# Patient Record
Sex: Male | Born: 1965
Health system: Southern US, Community
[De-identification: ages and names within clinical notes are randomized; demographics above are authoritative.]

## PROBLEM LIST (undated history)

## (undated) DIAGNOSIS — I480 Paroxysmal atrial fibrillation: Secondary | ICD-10-CM

## (undated) DIAGNOSIS — K219 Gastro-esophageal reflux disease without esophagitis: Secondary | ICD-10-CM

## (undated) DIAGNOSIS — K7581 Nonalcoholic steatohepatitis (NASH): Secondary | ICD-10-CM

## (undated) DIAGNOSIS — R739 Hyperglycemia, unspecified: Secondary | ICD-10-CM

## (undated) DIAGNOSIS — M1990 Unspecified osteoarthritis, unspecified site: Secondary | ICD-10-CM

## (undated) DIAGNOSIS — I77810 Thoracic aortic ectasia: Secondary | ICD-10-CM

## (undated) DIAGNOSIS — I5189 Other ill-defined heart diseases: Secondary | ICD-10-CM

## (undated) DIAGNOSIS — Z87442 Personal history of urinary calculi: Secondary | ICD-10-CM

## (undated) DIAGNOSIS — K810 Acute cholecystitis: Secondary | ICD-10-CM

## (undated) DIAGNOSIS — M069 Rheumatoid arthritis, unspecified: Secondary | ICD-10-CM

## (undated) DIAGNOSIS — F32A Depression, unspecified: Secondary | ICD-10-CM

## (undated) DIAGNOSIS — G4730 Sleep apnea, unspecified: Secondary | ICD-10-CM

## (undated) DIAGNOSIS — D374 Neoplasm of uncertain behavior of colon: Secondary | ICD-10-CM

## (undated) DIAGNOSIS — I10 Essential (primary) hypertension: Secondary | ICD-10-CM

## (undated) DIAGNOSIS — R748 Abnormal levels of other serum enzymes: Secondary | ICD-10-CM

## (undated) DIAGNOSIS — D126 Benign neoplasm of colon, unspecified: Secondary | ICD-10-CM

## (undated) DIAGNOSIS — Z9289 Personal history of other medical treatment: Secondary | ICD-10-CM

## (undated) DIAGNOSIS — E119 Type 2 diabetes mellitus without complications: Secondary | ICD-10-CM

## (undated) DIAGNOSIS — M059 Rheumatoid arthritis with rheumatoid factor, unspecified: Secondary | ICD-10-CM

## (undated) DIAGNOSIS — G4733 Obstructive sleep apnea (adult) (pediatric): Secondary | ICD-10-CM

## (undated) HISTORY — PX: OTHER SURGICAL HISTORY: SHX169

## (undated) HISTORY — PX: ARTHROTOMY: SHX134

## (undated) HISTORY — PX: CHOLECYSTECTOMY: SHX55

## (undated) HISTORY — PX: ROTATOR CUFF REPAIR: SHX139

## (undated) HISTORY — PX: INGUINAL HERNIA REPAIR: SUR1180

## (undated) HISTORY — DX: Unspecified osteoarthritis, unspecified site: M19.90

---
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
## 2004-12-13 ENCOUNTER — Emergency Department: Payer: Self-pay | Admitting: Emergency Medicine

## 2004-12-13 ENCOUNTER — Other Ambulatory Visit: Payer: Self-pay

## 2005-06-21 ENCOUNTER — Emergency Department: Payer: Self-pay | Admitting: Emergency Medicine

## 2005-06-21 ENCOUNTER — Other Ambulatory Visit: Payer: Self-pay

## 2005-06-26 ENCOUNTER — Ambulatory Visit: Payer: Self-pay | Admitting: General Practice

## 2005-07-02 ENCOUNTER — Ambulatory Visit: Payer: Self-pay | Admitting: Neurology

## 2008-09-18 ENCOUNTER — Emergency Department: Payer: Self-pay | Admitting: Emergency Medicine

## 2009-08-25 ENCOUNTER — Emergency Department: Payer: Self-pay | Admitting: Emergency Medicine

## 2009-08-29 ENCOUNTER — Ambulatory Visit: Payer: Self-pay | Admitting: General Practice

## 2009-09-15 ENCOUNTER — Emergency Department: Payer: Self-pay | Admitting: Emergency Medicine

## 2009-09-19 ENCOUNTER — Emergency Department: Payer: Self-pay | Admitting: Emergency Medicine

## 2010-02-13 ENCOUNTER — Observation Stay: Payer: Self-pay | Admitting: Internal Medicine

## 2011-02-12 ENCOUNTER — Emergency Department: Payer: Self-pay | Admitting: Internal Medicine

## 2012-08-21 ENCOUNTER — Emergency Department: Payer: Self-pay | Admitting: Unknown Physician Specialty

## 2012-08-24 LAB — BETA STREP CULTURE(ARMC)

## 2012-09-24 ENCOUNTER — Emergency Department: Payer: Self-pay | Admitting: Emergency Medicine

## 2012-10-01 ENCOUNTER — Other Ambulatory Visit: Payer: Self-pay | Admitting: General Practice

## 2012-10-01 ENCOUNTER — Ambulatory Visit: Payer: Self-pay | Admitting: General Practice

## 2012-10-01 LAB — COMPREHENSIVE METABOLIC PANEL
Albumin: 3.6 g/dL (ref 3.4–5.0)
Anion Gap: 3 — ABNORMAL LOW (ref 7–16)
Bilirubin,Total: 1.1 mg/dL — ABNORMAL HIGH (ref 0.2–1.0)
Calcium, Total: 8.4 mg/dL — ABNORMAL LOW (ref 8.5–10.1)
Chloride: 104 mmol/L (ref 98–107)
Potassium: 3.9 mmol/L (ref 3.5–5.1)
SGOT(AST): 16 U/L (ref 15–37)
SGPT (ALT): 37 U/L (ref 12–78)
Total Protein: 6.9 g/dL (ref 6.4–8.2)

## 2012-10-01 LAB — SEDIMENTATION RATE: Erythrocyte Sed Rate: 30 mm/hr — ABNORMAL HIGH (ref 0–15)

## 2012-10-01 LAB — CK: CK, Total: 99 U/L (ref 35–232)

## 2013-04-03 ENCOUNTER — Emergency Department: Payer: Self-pay | Admitting: Emergency Medicine

## 2013-04-03 LAB — URINALYSIS, COMPLETE
Bilirubin,UR: NEGATIVE
Blood: NEGATIVE
Glucose,UR: NEGATIVE mg/dL (ref 0–75)
Leukocyte Esterase: NEGATIVE
Ph: 6 (ref 4.5–8.0)
Protein: NEGATIVE
Specific Gravity: 1.015 (ref 1.003–1.030)
Squamous Epithelial: NONE SEEN

## 2013-04-03 LAB — CBC
MCH: 29.4 pg (ref 26.0–34.0)
MCHC: 34.6 g/dL (ref 32.0–36.0)
MCV: 85 fL (ref 80–100)
Platelet: 229 10*3/uL (ref 150–440)
RBC: 5.08 10*6/uL (ref 4.40–5.90)
RDW: 16 % — ABNORMAL HIGH (ref 11.5–14.5)
WBC: 6.9 10*3/uL (ref 3.8–10.6)

## 2013-04-03 LAB — BASIC METABOLIC PANEL
BUN: 12 mg/dL (ref 7–18)
Calcium, Total: 9.1 mg/dL (ref 8.5–10.1)
Chloride: 105 mmol/L (ref 98–107)
Co2: 29 mmol/L (ref 21–32)
Creatinine: 1.23 mg/dL (ref 0.60–1.30)
EGFR (Non-African Amer.): >60
Glucose: 95 mg/dL (ref 65–99)
Osmolality: 273 (ref 275–301)
Sodium: 137 mmol/L (ref 136–145)

## 2013-10-26 ENCOUNTER — Telehealth: Payer: Self-pay

## 2013-10-26 ENCOUNTER — Emergency Department: Payer: Self-pay | Admitting: Emergency Medicine

## 2013-10-26 LAB — URINALYSIS, COMPLETE
BILIRUBIN, UR: NEGATIVE
Bacteria: NONE SEEN
Blood: NEGATIVE
Glucose,UR: NEGATIVE mg/dL (ref 0–75)
Ketone: NEGATIVE
Leukocyte Esterase: NEGATIVE
Nitrite: NEGATIVE
Ph: 7 (ref 4.5–8.0)
Protein: NEGATIVE
RBC,UR: <1 /HPF (ref 0–5)
Specific Gravity: 1.008 (ref 1.003–1.030)
Squamous Epithelial: <1
WBC UR: NONE SEEN /HPF (ref 0–5)

## 2013-10-26 LAB — TROPONIN I: Troponin-I: <0.02 ng/mL

## 2013-10-26 LAB — BASIC METABOLIC PANEL
ANION GAP: 3 — AB (ref 7–16)
BUN: 13 mg/dL (ref 7–18)
CALCIUM: 8.8 mg/dL (ref 8.5–10.1)
CHLORIDE: 105 mmol/L (ref 98–107)
CO2: 29 mmol/L (ref 21–32)
CREATININE: 1.11 mg/dL (ref 0.60–1.30)
GLUCOSE: 113 mg/dL — AB (ref 65–99)
OSMOLALITY: 275 (ref 275–301)
POTASSIUM: 4.4 mmol/L (ref 3.5–5.1)
SODIUM: 137 mmol/L (ref 136–145)

## 2013-10-26 LAB — CBC
HCT: 45.6 % (ref 40.0–52.0)
HGB: 15.6 g/dL (ref 13.0–18.0)
MCH: 27.3 pg (ref 26.0–34.0)
MCHC: 34.1 g/dL (ref 32.0–36.0)
MCV: 80 fL (ref 80–100)
Platelet: 190 10*3/uL (ref 150–440)
RBC: 5.7 10*6/uL (ref 4.40–5.90)
RDW: 14.2 % (ref 11.5–14.5)
WBC: 6.5 10*3/uL (ref 3.8–10.6)

## 2013-10-26 LAB — PRO B NATRIURETIC PEPTIDE: B-Type Natriuretic Peptide: 14 pg/mL (ref 0–125)

## 2013-10-26 NOTE — Telephone Encounter (Signed)
Called pt to schedule f/u from ED. Dr. Ellyn Hack ok'd pt to be seen within the next week next available. No particular Dr.

## 2013-10-31 DIAGNOSIS — M059 Rheumatoid arthritis with rheumatoid factor, unspecified: Secondary | ICD-10-CM | POA: Insufficient documentation

## 2013-10-31 DIAGNOSIS — R748 Abnormal levels of other serum enzymes: Secondary | ICD-10-CM | POA: Insufficient documentation

## 2013-10-31 DIAGNOSIS — G4730 Sleep apnea, unspecified: Secondary | ICD-10-CM | POA: Insufficient documentation

## 2013-10-31 DIAGNOSIS — I4891 Unspecified atrial fibrillation: Secondary | ICD-10-CM | POA: Diagnosis present

## 2013-10-31 DIAGNOSIS — I10 Essential (primary) hypertension: Secondary | ICD-10-CM | POA: Insufficient documentation

## 2013-10-31 DIAGNOSIS — E669 Obesity, unspecified: Secondary | ICD-10-CM | POA: Insufficient documentation

## 2015-03-09 ENCOUNTER — Encounter: Payer: Self-pay | Admitting: *Deleted

## 2015-03-12 ENCOUNTER — Ambulatory Visit: Payer: BLUE CROSS/BLUE SHIELD | Admitting: Anesthesiology

## 2015-03-12 ENCOUNTER — Ambulatory Visit
Admission: RE | Admit: 2015-03-12 | Discharge: 2015-03-12 | Disposition: A | Discharge status: Home / Self Care | Payer: BLUE CROSS/BLUE SHIELD | Source: Ambulatory Visit | Attending: Unknown Physician Specialty | Admitting: Unknown Physician Specialty

## 2015-03-12 ENCOUNTER — Encounter
Admission: RE | Disposition: A | Discharge status: Home / Self Care | Payer: Self-pay | Source: Ambulatory Visit | Attending: Unknown Physician Specialty

## 2015-03-12 ENCOUNTER — Encounter: Payer: Self-pay | Admitting: *Deleted

## 2015-03-12 DIAGNOSIS — G4730 Sleep apnea, unspecified: Secondary | ICD-10-CM | POA: Diagnosis not present

## 2015-03-12 DIAGNOSIS — M1990 Unspecified osteoarthritis, unspecified site: Secondary | ICD-10-CM | POA: Diagnosis not present

## 2015-03-12 DIAGNOSIS — Z791 Long term (current) use of non-steroidal anti-inflammatories (NSAID): Secondary | ICD-10-CM | POA: Diagnosis not present

## 2015-03-12 DIAGNOSIS — D123 Benign neoplasm of transverse colon: Secondary | ICD-10-CM | POA: Diagnosis not present

## 2015-03-12 DIAGNOSIS — I10 Essential (primary) hypertension: Secondary | ICD-10-CM | POA: Diagnosis not present

## 2015-03-12 DIAGNOSIS — K2980 Duodenitis without bleeding: Secondary | ICD-10-CM | POA: Insufficient documentation

## 2015-03-12 DIAGNOSIS — R1310 Dysphagia, unspecified: Secondary | ICD-10-CM | POA: Diagnosis not present

## 2015-03-12 DIAGNOSIS — K210 Gastro-esophageal reflux disease with esophagitis: Secondary | ICD-10-CM | POA: Diagnosis not present

## 2015-03-12 DIAGNOSIS — Z79899 Other long term (current) drug therapy: Secondary | ICD-10-CM | POA: Diagnosis not present

## 2015-03-12 DIAGNOSIS — D126 Benign neoplasm of colon, unspecified: Secondary | ICD-10-CM

## 2015-03-12 DIAGNOSIS — K640 First degree hemorrhoids: Secondary | ICD-10-CM | POA: Diagnosis not present

## 2015-03-12 DIAGNOSIS — D125 Benign neoplasm of sigmoid colon: Secondary | ICD-10-CM | POA: Insufficient documentation

## 2015-03-12 DIAGNOSIS — K5900 Constipation, unspecified: Secondary | ICD-10-CM | POA: Diagnosis not present

## 2015-03-12 DIAGNOSIS — D509 Iron deficiency anemia, unspecified: Secondary | ICD-10-CM | POA: Insufficient documentation

## 2015-03-12 DIAGNOSIS — D374 Neoplasm of uncertain behavior of colon: Secondary | ICD-10-CM

## 2015-03-12 DIAGNOSIS — Z9889 Other specified postprocedural states: Secondary | ICD-10-CM | POA: Diagnosis not present

## 2015-03-12 DIAGNOSIS — K5909 Other constipation: Secondary | ICD-10-CM | POA: Diagnosis present

## 2015-03-12 DIAGNOSIS — K625 Hemorrhage of anus and rectum: Secondary | ICD-10-CM | POA: Diagnosis present

## 2015-03-12 DIAGNOSIS — K2950 Unspecified chronic gastritis without bleeding: Secondary | ICD-10-CM | POA: Diagnosis not present

## 2015-03-12 DIAGNOSIS — R12 Heartburn: Secondary | ICD-10-CM | POA: Insufficient documentation

## 2015-03-12 HISTORY — PX: COLONOSCOPY WITH PROPOFOL: SHX5780

## 2015-03-12 HISTORY — DX: Essential (primary) hypertension: I10

## 2015-03-12 HISTORY — DX: Unspecified osteoarthritis, unspecified site: M19.90

## 2015-03-12 HISTORY — DX: Benign neoplasm of colon, unspecified: D12.6

## 2015-03-12 HISTORY — DX: Neoplasm of uncertain behavior of colon: D37.4

## 2015-03-12 HISTORY — PX: ESOPHAGOGASTRODUODENOSCOPY (EGD) WITH PROPOFOL: SHX5813

## 2015-03-12 LAB — HM COLONOSCOPY

## 2015-03-12 SURGERY — ESOPHAGOGASTRODUODENOSCOPY (EGD) WITH PROPOFOL
Anesthesia: General

## 2015-03-12 MED ORDER — SODIUM CHLORIDE 0.9 % IV SOLN
INTRAVENOUS | Status: DC
  Administered 2015-03-12: 15:00:00 via INTRAVENOUS

## 2015-03-12 MED ORDER — SODIUM CHLORIDE 0.9 % IV SOLN
INTRAVENOUS | Status: DC
  Administered 2015-03-12 (×3): via INTRAVENOUS

## 2015-03-12 MED ORDER — PROPOFOL 10 MG/ML IV BOLUS
INTRAVENOUS | Status: DC | PRN
  Administered 2015-03-12 (×2): 50 mg via INTRAVENOUS
  Administered 2015-03-12: 100 mg via INTRAVENOUS

## 2015-03-12 MED ORDER — PROPOFOL INFUSION 10 MG/ML OPTIME
INTRAVENOUS | Status: DC | PRN
  Administered 2015-03-12: 140 ug/kg/min via INTRAVENOUS

## 2015-03-12 NOTE — Op Note (Signed)
[facility] Patient Name: Jeremy West Procedure Date: 03/12/2015 2:43 PM MRN: [id number] Account #: [id number] Date of Birth: [birth year] Admit Type: Outpatient Age: 49 Room: [facility] ENDO ROOM 1 Gender: Male Note Status: Finalized Procedure:         Colonoscopy Indications:       Rectal bleeding, Iron deficiency anemia, Constipation Providers:         Scot Jun, MD Referring MD:      Kandyce Rud, MD (Referring MD) Medicines:         Propofol per Anesthesia Complications:     No immediate complications. Procedure:         Pre-Anesthesia Assessment:                    - After reviewing the risks and benefits, the patient was                     deemed in satisfactory condition to undergo the procedure.                    After obtaining informed consent, the colonoscope was                     passed under direct vision. Throughout the procedure, the                     patient's blood pressure, pulse, and oxygen saturations                     were monitored continuously. The Colonoscope was                     introduced through the anus and advanced to the the cecum,                     identified by appendiceal orifice and ileocecal valve. The                     colonoscopy was performed without difficulty. The patient                     tolerated the procedure well. The quality of the bowel                     preparation was good. Findings:      A 5 mm polyp was found in the sigmoid colon. The polyp was sessile. The       polyp was removed with a hot snare. Resection and retrieval were       complete.      A 10 mm polyp was found in the distal descending colon. The polyp was       pedunculated. Stalk was thick also. The polyp was removed with a hot       snare. Resection and retrieval were complete. Some active bleeding from       the stalk occured after transection. To prevent further bleeding after       the  polypectomy, two hemostatic clips were successfully placed. There       was no bleeding at the end of the procedure.      Internal hemorrhoids were found during endoscopy. The hemorrhoids were       small and Grade I (internal hemorrhoids that do not prolapse). Impression:        -  One 5 mm polyp in the sigmoid colon. Resected and                     retrieved.                    - One 10 mm polyp in the distal descending colon. Resected                     and retrieved. Clips were placed.                    - Internal hemorrhoids. Recommendation:    - Await pathology results. Scot Jun, MD 03/12/2015 3:44:29 PM This report has been signed electronically. Number of Addenda: 0 Note Initiated On: 03/12/2015 2:43 PM Scope Withdrawal Time: 0 hours 28 minutes 40 seconds  Total Procedure Duration: 0 hours 35 minutes 34 seconds       [facility]

## 2015-03-12 NOTE — H&P (Signed)
   Primary Care Physician:  Mar Daring, PA-C Primary Gastroenterologist:  Dr. Vira Agar  Pre-Procedure History & Physical: HPI:  Jeremy West is a 49 y.o. male is here for an endoscopy and colonoscopy.   Past Medical History  Diagnosis Date  . Arthritis   . Hypertension     Past Surgical History  Procedure Laterality Date  . Complex repair leg    . Arthrotomy N/A     Prior to Admission medications   Medication Sig Start Date End Date Taking? Authorizing Provider  folic acid (FOLVITE) 1 MG tablet Take 1 mg by mouth daily.   Yes Historical Provider, MD  losartan-hydrochlorothiazide (HYZAAR) 100-12.5 MG per tablet Take 1 tablet by mouth daily.   Yes Historical Provider, MD  naproxen (NAPROSYN) 500 MG tablet Take 500 mg by mouth 2 (two) times daily with a meal.   Yes Historical Provider, MD  hydrocortisone (ANUSOL-HC) 25 MG suppository Place 25 mg rectally 2 (two) times daily.    Historical Provider, MD    Allergies as of 01/30/2015  . (Not on File)    History reviewed. No pertinent family history.  Social History   Social History  . Marital Status: Single    Spouse Name: N/A  . Number of Children: N/A  . Years of Education: N/A   Occupational History  . Not on file.   Social History Main Topics  . Smoking status: Never Smoker   . Smokeless tobacco: Former Systems developer  . Alcohol Use: 0.6 oz/week    1 Cans of beer per week  . Drug Use: No  . Sexual Activity: Not on file   Other Topics Concern  . Not on file   Social History Narrative    Review of Systems: See HPI, otherwise negative ROS  Physical Exam: BP 155/83 mmHg  Pulse 84  Temp(Src) 97.4 F (36.3 C) (Tympanic)  Resp 16  Ht 6\' 1"  (1.854 m)  Wt 154.223 kg (340 lb)  BMI 44.87 kg/m2  SpO2 98% General:   Alert,  pleasant and cooperative in NAD Head:  Normocephalic and atraumatic. Neck:  Supple; no masses or thyromegaly. Lungs:  Clear throughout to auscultation.    Heart:  Regular rate and  rhythm. Abdomen:  Soft, nontender and nondistended. Normal bowel sounds, without guarding, and without rebound.   Neurologic:  Alert and  oriented x4;  grossly normal neurologically.  Impression/Plan: Jeremy West is here for an endoscopy and colonoscopy to be performed for iron def anemia, rectal bleeding, constipation, heartburn  Risks, benefits, limitations, and alternatives regarding  endoscopy and colonoscopy have been reviewed with the patient.  Questions have been answered.  All parties agreeable.   Gaylyn Cheers, MD  03/12/2015, 2:05 PM

## 2015-03-12 NOTE — Transfer of Care (Signed)
Immediate Anesthesia Transfer of Care Note  Patient: Jeremy West  Procedure(s) Performed: Procedure(s): ESOPHAGOGASTRODUODENOSCOPY (EGD) WITH PROPOFOL (N/A) COLONOSCOPY WITH PROPOFOL (N/A)  Patient Location: Endoscopy Unit  Anesthesia Type:General  Level of Consciousness: awake  Airway & Oxygen Therapy: Patient Spontanous Breathing and Patient connected to nasal cannula oxygen  Post-op Assessment: Report given to RN  Post vital signs: Reviewed  Last Vitals:  Filed Vitals:   03/12/15 1542  BP: 137/96  Pulse: 85  Temp: 35.7 C  Resp: 22    Complications: No apparent anesthesia complications

## 2015-03-12 NOTE — Op Note (Signed)
[facility] Gastroenterology Patient Name: Jeremy West Procedure Date: 03/12/2015 2:44 PM MRN: [id number] Account #: [id number] Date of Birth: 04-Jun-1966 Admit Type: Outpatient Age: 49 Room: [facility] ENDO ROOM 1 Gender: Male Note Status: Finalized Procedure:         Upper GI endoscopy Indications:       Dysphagia, Heartburn, Follow-up of gastro-esophageal                     reflux disease Providers:         Scot Jun, MD Referring MD:      Kandyce Rud, MD (Referring MD) Medicines:         Propofol per Anesthesia Complications:     No immediate complications. Procedure:         Pre-Anesthesia Assessment:                    - After reviewing the risks and benefits, the patient was                     deemed in satisfactory condition to undergo the procedure.                    After obtaining informed consent, the endoscope was passed                     under direct vision. Throughout the procedure, the                     patient's blood pressure, pulse, and oxygen saturations                     were monitored continuously. The Endoscope was introduced                     through the mouth, and advanced to the second part of                     duodenum. The upper GI endoscopy was accomplished without                     difficulty. The patient tolerated the procedure well. Findings:      LA Grade C-D esophagitis(one or more mucosal breaks continuous between       tops of 2 or more mucosal folds, less than 75% circumference)       esophagitis with no bleeding was found 30 cm from the incisors. Streaks       passed to the GEJ aand became confluent. Overall significantly severe       esophagitis.      Patchy mildly erythematous mucosa without bleeding was found in the       gastric antrum. Bx done antrum and body.      Diffuse and patchy moderate inflammation characterized by erythema and       granularity was found in the duodenal bulb and in  the second part of the       duodenum. Impression:        - LA Grade C reflux esophagitis.                    - Erythematous mucosa in the antrum.                    - Duodenitis.                    -  No specimens collected. Recommendation:    - Await pathology results. Scot Jun, MD 03/12/2015 3:01:35 PM This report has been signed electronically. Number of Addenda: 0 Note Initiated On: 03/12/2015 2:44 PM      [facility]

## 2015-03-12 NOTE — Anesthesia Preprocedure Evaluation (Signed)
Anesthesia Evaluation  Patient identified by MRN, date of birth, ID band Patient awake    Reviewed: Allergy & Precautions, NPO status , Patient's Chart, lab work & pertinent test results  History of Anesthesia Complications Negative for: history of anesthetic complications  Airway Mallampati: III  TM Distance: >3 FB Neck ROM: Full    Dental  (+) Chipped   Pulmonary sleep apnea (not using CPAP) ,          Cardiovascular hypertension, Pt. on medications + dysrhythmias (hx of 1 time afib held overnight and released) Atrial Fibrillation     Neuro/Psych Seizures - (as a child, no problems since 89 yo),     GI/Hepatic GERD- (hx of no meds, no symptoms now)  ,  Endo/Other    Renal/GU      Musculoskeletal  (+) Arthritis -, Osteoarthritis,    Abdominal   Peds  Hematology   Anesthesia Other Findings   Reproductive/Obstetrics                             Anesthesia Physical Anesthesia Plan  ASA: III  Anesthesia Plan: General   Post-op Pain Management:    Induction: Intravenous  Airway Management Planned: Nasal Cannula  Additional Equipment:   Intra-op Plan:   Post-operative Plan:   Informed Consent: I have reviewed the patients History and Physical, chart, labs and discussed the procedure including the risks, benefits and alternatives for the proposed anesthesia with the patient or authorized representative who has indicated his/her understanding and acceptance.     Plan Discussed with:   Anesthesia Plan Comments:         Anesthesia Quick Evaluation

## 2015-03-14 ENCOUNTER — Encounter: Payer: Self-pay | Admitting: Unknown Physician Specialty

## 2015-03-16 LAB — SURGICAL PATHOLOGY

## 2015-03-19 NOTE — Anesthesia Postprocedure Evaluation (Signed)
  Anesthesia Post-op Note  Patient: Jeremy West  Procedure(s) Performed: Procedure(s): ESOPHAGOGASTRODUODENOSCOPY (EGD) WITH PROPOFOL (N/A) COLONOSCOPY WITH PROPOFOL (N/A)  Anesthesia type:General  Patient location: PACU  Post pain: Pain level controlled  Post assessment: Post-op Vital signs reviewed, Patient's Cardiovascular Status Stable, Respiratory Function Stable, Patent Airway and No signs of Nausea or vomiting  Post vital signs: Reviewed and stable  Last Vitals:  Filed Vitals:   03/12/15 1602  BP: 127/88  Pulse: 72  Temp:   Resp: 18    Level of consciousness: awake, alert  and patient cooperative  Complications: No apparent anesthesia complications

## 2015-10-25 ENCOUNTER — Encounter: Payer: Self-pay | Admitting: Emergency Medicine

## 2015-10-25 ENCOUNTER — Emergency Department
Admission: EM | Admit: 2015-10-25 | Discharge: 2015-10-26 | Disposition: A | Discharge status: Home / Self Care | Payer: BLUE CROSS/BLUE SHIELD | Attending: Emergency Medicine | Admitting: Emergency Medicine

## 2015-10-25 DIAGNOSIS — I10 Essential (primary) hypertension: Secondary | ICD-10-CM | POA: Diagnosis not present

## 2015-10-25 HISTORY — DX: Rheumatoid arthritis, unspecified: M06.9

## 2015-10-25 NOTE — ED Notes (Signed)
Patient ambulatory to triage with steady gait, without difficulty or distress noted; pt reports recent HTN and was placed on losartan-HCTZ 2wks ago after being off for many years; has cont to have HTN and now with frontal HA and flushing of face

## 2015-10-26 LAB — BASIC METABOLIC PANEL
ANION GAP: 7 (ref 5–15)
BUN: 13 mg/dL (ref 6–20)
CALCIUM: 9.5 mg/dL (ref 8.9–10.3)
CO2: 27 mmol/L (ref 22–32)
Chloride: 100 mmol/L — ABNORMAL LOW (ref 101–111)
Creatinine, Ser: 0.83 mg/dL (ref 0.61–1.24)
Glucose, Bld: 122 mg/dL — ABNORMAL HIGH (ref 65–99)
Potassium: 3.4 mmol/L — ABNORMAL LOW (ref 3.5–5.1)
Sodium: 134 mmol/L — ABNORMAL LOW (ref 135–145)

## 2015-10-26 LAB — CBC
HEMATOCRIT: 43.6 % (ref 40.0–52.0)
Hemoglobin: 14.7 g/dL (ref 13.0–18.0)
MCH: 25.5 pg — ABNORMAL LOW (ref 26.0–34.0)
MCHC: 33.6 g/dL (ref 32.0–36.0)
MCV: 75.7 fL — ABNORMAL LOW (ref 80.0–100.0)
PLATELETS: 223 10*3/uL (ref 150–440)
RBC: 5.76 MIL/uL (ref 4.40–5.90)
RDW: 15.3 % — AB (ref 11.5–14.5)
WBC: 7.2 10*3/uL (ref 3.8–10.6)

## 2015-10-26 LAB — TROPONIN I: Troponin I: 0.03 ng/mL (ref <0.031)

## 2015-10-26 MED ORDER — ACETAMINOPHEN 325 MG PO TABS
650.0000 mg | ORAL_TABLET | Freq: Once | ORAL | Status: AC
  Administered 2015-10-26: 650 mg via ORAL
  Filled 2015-10-26: qty 2

## 2015-10-26 NOTE — ED Provider Notes (Signed)
[facility] Emergency Department Provider Note  ____________________________________________  Time seen: Approximately 0001 AM  I have reviewed the triage vital signs and the nursing notes.   HISTORY  Chief Complaint Hypertension    HPI Jeremy West is a 50 y.o. male who comes into the hospital today with elevated blood pressure. The patient reports that his blood pressure when he arrived she was 170s/ 90s. He reports his blood pressure typically runs around 130/90. The patient's eyes primary care physician today and he doubled his dose of high blood pressure medicine as his blood pressure was elevated 150s/100. The patient reports that he took his blood pressure medicine this morning before he went to his doctor's appointment and then he took a second dose after he left his doctor's appointment. He reports that tonight as he was getting ready to go to sleep his face was burning any had a mild headache. He reports that he knew his blood pressure was high and wanted to get it checked. He reports he wanted to make sure that he was going to be okay if he went to sleep. The patient reports his headache is a 1-3 out of 10 in intensity. He did not take anything for the headache. He reports that he has these symptoms whenever his blood pressure was elevated but he is concerned that it is very high. The patient does not check his blood pressure regularly. He reports that he had been off of his blood pressure medicine in 2 weeks ago was placed back on it. The patient denies any chest pain or shortness of breath, he has no blurred vision or vomiting or nausea or any other symptoms.   Past Medical History  Diagnosis Date  . Arthritis   . Hypertension   . Acid reflux   . Rheumatoid arthritis ([place])     There are no active problems to display for this patient.   Past Surgical History  Procedure Laterality Date  . Complex repair leg    . Arthrotomy N/A   .  Esophagogastroduodenoscopy (egd) with propofol N/A 03/12/2015    Procedure: ESOPHAGOGASTRODUODENOSCOPY (EGD) WITH PROPOFOL;  Surgeon: Manya Silvas, MD;  Location: [place];  Service: Endoscopy;  Laterality: N/A;  . Colonoscopy with propofol N/A 03/12/2015    Procedure: COLONOSCOPY WITH PROPOFOL;  Surgeon: Manya Silvas, MD;  Location: [facility] ENDOSCOPY;  Service: Endoscopy;  Laterality: N/A;    Current Outpatient Rx  Name  Route  Sig  Dispense  Refill  . folic acid (FOLVITE) 1 MG tablet   Oral   Take 1 mg by mouth daily.         . hydrocortisone (ANUSOL-HC) 25 MG suppository   Rectal   Place 25 mg rectally 2 (two) times daily.         Marland Kitchen losartan-hydrochlorothiazide (HYZAAR) 100-12.5 MG per tablet   Oral   Take 1 tablet by mouth daily.         . naproxen (NAPROSYN) 500 MG tablet   Oral   Take 500 mg by mouth 2 (two) times daily with a meal.           Allergies Adalimumab and Penicillins  No family history on file.  Social History Social History  Substance Use Topics  . Smoking status: Never Smoker   . Smokeless tobacco: Former Systems developer  . Alcohol Use: 0.6 oz/week    1 Cans of beer per week    Review of Systems Constitutional: No fever/chills Eyes: No  visual changes. ENT: No sore throat. Cardiovascular: Denies chest pain. Respiratory: Denies shortness of breath. Gastrointestinal: No abdominal pain.  No nausea, no vomiting.  No diarrhea.  No constipation. Genitourinary: Negative for dysuria. Musculoskeletal: Negative for back pain. Skin: Negative for rash. Neurological: Headache  10-point ROS otherwise negative.  ____________________________________________   PHYSICAL EXAM:  VITAL SIGNS: ED Triage Vitals  Enc Vitals Group     BP 10/25/15 2343 171/91 mmHg     Pulse Rate 10/25/15 2343 80     Resp 10/25/15 2343 20     Temp 10/25/15 2343 97.9 F (36.6 C)     Temp Source 10/25/15 2343 Oral     SpO2 10/25/15 2343 97 %     Weight 10/25/15 2343  349 lb (158.305 kg)     Height 10/25/15 2343 6' (1.829 m)     Head Cir --      Peak Flow --      Pain Score 10/25/15 2342 2     Pain Loc --      Pain Edu? --      Excl. in [place]? --     Constitutional: Alert and oriented. Well appearing and in no acute distress. Eyes: Conjunctivae are normal. PERRL. EOMI. Head: Atraumatic. Nose: No congestion/rhinnorhea. Mouth/Throat: Mucous membranes are moist.  Oropharynx non-erythematous. Cardiovascular: Normal rate, regular rhythm. Grossly normal heart sounds.  Good peripheral circulation. Respiratory: Normal respiratory effort.  No retractions. Lungs CTAB. Gastrointestinal: Soft and nontender. No distention. Positive bowel sounds Musculoskeletal: No lower extremity tenderness nor edema.   Neurologic:  Normal speech and language. Cranial nerves II through XII are grossly intact with no focal motor or neuro deficits. Skin:  Skin is warm, dry and intact.  Psychiatric: Mood and affect are normal.   ____________________________________________   LABS (all labs ordered are listed, but only abnormal results are displayed)  Labs Reviewed  CBC - Abnormal; Notable for the following:    MCV 75.7 (*)    MCH 25.5 (*)    RDW 15.3 (*)    All other components within normal limits  BASIC METABOLIC PANEL - Abnormal; Notable for the following:    Sodium 134 (*)    Potassium 3.4 (*)    Chloride 100 (*)    Glucose, Bld 122 (*)    All other components within normal limits  TROPONIN I   ____________________________________________  EKG  ED ECG REPORT I, Loney Hering, the attending physician, personally viewed and interpreted this ECG.   Date: 10/25/2015  EKG Time: 2359  Rate: 81  Rhythm: normal sinus rhythm  Axis: Normal  Intervals:none  ST&T Change: Flipped T waves in lead 3  ____________________________________________  RADIOLOGY  None ____________________________________________   PROCEDURES  Procedure(s) performed:  None  Critical Care performed: No  ____________________________________________   INITIAL IMPRESSION / ASSESSMENT AND PLAN / ED COURSE  Pertinent labs & imaging results that were available during my care of the patient were reviewed by me and considered in my medical decision making (see chart for details).  This is a 50 year old male with a history of hypertension who comes in today with elevated blood pressure. The patient just had his medication dose increased. I will check some basic blood work and give the patient some Tylenol for his headache. He'll be reassessed as well as his blood pressure.  The patient feels improved. His blood pressure is also improved. He will be discharged home to follow back up with his primary care physician. ____________________________________________  FINAL CLINICAL IMPRESSION(S) / ED DIAGNOSES  Final diagnoses:  Essential hypertension      Loney Hering, MD 10/26/15 [phone]

## 2015-10-26 NOTE — Discharge Instructions (Signed)
Hypertension Hypertension, commonly called high blood pressure, is when the force of blood pumping through your arteries is too strong. Your arteries are the blood vessels that carry blood from your heart throughout your body. A blood pressure reading consists of a higher number over a lower number, such as 110/72. The higher number (systolic) is the pressure inside your arteries when your heart pumps. The lower number (diastolic) is the pressure inside your arteries when your heart relaxes. Ideally you want your blood pressure below 120/80. Hypertension forces your heart to work harder to pump blood. Your arteries may become narrow or stiff. Having untreated or uncontrolled hypertension can cause heart attack, stroke, kidney disease, and other problems. RISK FACTORS Some risk factors for high blood pressure are controllable. Others are not.  Risk factors you cannot control include:   Race. You may be at higher risk if you are African American.  Age. Risk increases with age.  Gender. Men are at higher risk than women before age 45 years. After age 65, women are at higher risk than men. Risk factors you can control include:  Not getting enough exercise or physical activity.  Being overweight.  Getting too much fat, sugar, calories, or salt in your diet.  Drinking too much alcohol. SIGNS AND SYMPTOMS Hypertension does not usually cause signs or symptoms. Extremely high blood pressure (hypertensive crisis) may cause headache, anxiety, shortness of breath, and nosebleed. DIAGNOSIS To check if you have hypertension, your health care provider will measure your blood pressure while you are seated, with your arm held at the level of your heart. It should be measured at least twice using the same arm. Certain conditions can cause a difference in blood pressure between your right and left arms. A blood pressure reading that is higher than normal on one occasion does not mean that you need treatment. If  it is not clear whether you have high blood pressure, you may be asked to return on a different day to have your blood pressure checked again. Or, you may be asked to monitor your blood pressure at home for 1 or more weeks. TREATMENT Treating high blood pressure includes making lifestyle changes and possibly taking medicine. Living a healthy lifestyle can help lower high blood pressure. You may need to change some of your habits. Lifestyle changes may include:  Following the DASH diet. This diet is high in fruits, vegetables, and whole grains. It is low in salt, red meat, and added sugars.  Keep your sodium intake below 2,300 mg per day.  Getting at least 30-45 minutes of aerobic exercise at least 4 times per week.  Losing weight if necessary.  Not smoking.  Limiting alcoholic beverages.  Learning ways to reduce stress. Your health care provider may prescribe medicine if lifestyle changes are not enough to get your blood pressure under control, and if one of the following is true:  You are 18-59 years of age and your systolic blood pressure is above 140.  You are 60 years of age or older, and your systolic blood pressure is above 150.  Your diastolic blood pressure is above 90.  You have diabetes, and your systolic blood pressure is over 140 or your diastolic blood pressure is over 90.  You have kidney disease and your blood pressure is above 140/90.  You have heart disease and your blood pressure is above 140/90. Your personal target blood pressure may vary depending on your medical conditions, your age, and other factors. HOME CARE INSTRUCTIONS    Have your blood pressure rechecked as directed by your health care provider.   Take medicines only as directed by your health care provider. Follow the directions carefully. Blood pressure medicines must be taken as prescribed. The medicine does not work as well when you skip doses. Skipping doses also puts you at risk for  problems.  Do not smoke.   Monitor your blood pressure at home as directed by your health care provider. SEEK MEDICAL CARE IF:   You think you are having a reaction to medicines taken.  You have recurrent headaches or feel dizzy.  You have swelling in your ankles.  You have trouble with your vision. SEEK IMMEDIATE MEDICAL CARE IF:  You develop a severe headache or confusion.  You have unusual weakness, numbness, or feel faint.  You have severe chest or abdominal pain.  You vomit repeatedly.  You have trouble breathing. MAKE SURE YOU:   Understand these instructions.  Will watch your condition.  Will get help right away if you are not doing well or get worse.   This information is not intended to replace advice given to you by your health care provider. Make sure you discuss any questions you have with your health care provider.   Document Released: 07/07/2005 Document Revised: 11/21/2014 Document Reviewed: 04/29/2013 Elsevier Interactive Patient Education 2016 [employer]  

## 2016-02-08 DIAGNOSIS — H52223 Regular astigmatism, bilateral: Secondary | ICD-10-CM | POA: Diagnosis not present

## 2016-02-22 LAB — LIPID PANEL
Cholesterol: 173 (ref 0–200)
HDL: 41 (ref 35–70)
LDL Cholesterol: 98
Triglycerides: 169 — AB (ref 40–160)

## 2016-05-28 DIAGNOSIS — M5440 Lumbago with sciatica, unspecified side: Secondary | ICD-10-CM | POA: Diagnosis not present

## 2016-05-28 DIAGNOSIS — M5137 Other intervertebral disc degeneration, lumbosacral region: Secondary | ICD-10-CM | POA: Diagnosis not present

## 2016-05-28 DIAGNOSIS — M4317 Spondylolisthesis, lumbosacral region: Secondary | ICD-10-CM | POA: Diagnosis not present

## 2017-01-27 DIAGNOSIS — Z79899 Other long term (current) drug therapy: Secondary | ICD-10-CM | POA: Diagnosis not present

## 2017-01-27 DIAGNOSIS — Z6841 Body Mass Index (BMI) 40.0 and over, adult: Secondary | ICD-10-CM | POA: Diagnosis not present

## 2017-01-27 DIAGNOSIS — Z888 Allergy status to other drugs, medicaments and biological substances status: Secondary | ICD-10-CM | POA: Diagnosis not present

## 2017-01-27 DIAGNOSIS — G4733 Obstructive sleep apnea (adult) (pediatric): Secondary | ICD-10-CM | POA: Diagnosis not present

## 2017-01-27 DIAGNOSIS — Z880 Allergy status to penicillin: Secondary | ICD-10-CM | POA: Diagnosis not present

## 2017-01-27 DIAGNOSIS — M19041 Primary osteoarthritis, right hand: Secondary | ICD-10-CM | POA: Diagnosis not present

## 2017-01-27 DIAGNOSIS — M069 Rheumatoid arthritis, unspecified: Secondary | ICD-10-CM | POA: Diagnosis not present

## 2017-01-27 DIAGNOSIS — M7989 Other specified soft tissue disorders: Secondary | ICD-10-CM | POA: Diagnosis not present

## 2017-01-27 DIAGNOSIS — M2560 Stiffness of unspecified joint, not elsewhere classified: Secondary | ICD-10-CM | POA: Diagnosis not present

## 2017-01-27 DIAGNOSIS — I10 Essential (primary) hypertension: Secondary | ICD-10-CM | POA: Diagnosis not present

## 2017-01-27 DIAGNOSIS — M2540 Effusion, unspecified joint: Secondary | ICD-10-CM | POA: Diagnosis not present

## 2017-01-27 DIAGNOSIS — E669 Obesity, unspecified: Secondary | ICD-10-CM | POA: Diagnosis not present

## 2017-01-27 DIAGNOSIS — M19072 Primary osteoarthritis, left ankle and foot: Secondary | ICD-10-CM | POA: Diagnosis not present

## 2017-01-27 DIAGNOSIS — R52 Pain, unspecified: Secondary | ICD-10-CM | POA: Diagnosis not present

## 2017-01-27 DIAGNOSIS — M19042 Primary osteoarthritis, left hand: Secondary | ICD-10-CM | POA: Diagnosis not present

## 2017-02-04 LAB — LIPID PANEL
Cholesterol: 212 — AB (ref 0–200)
HDL: 56 (ref 35–70)
LDL Cholesterol: 127
Triglycerides: 144 (ref 40–160)

## 2017-02-04 LAB — PSA: PSA: 0.7

## 2017-02-18 DIAGNOSIS — M059 Rheumatoid arthritis with rheumatoid factor, unspecified: Secondary | ICD-10-CM | POA: Diagnosis not present

## 2017-02-18 DIAGNOSIS — Z5112 Encounter for antineoplastic immunotherapy: Secondary | ICD-10-CM | POA: Diagnosis not present

## 2017-03-04 DIAGNOSIS — Z79899 Other long term (current) drug therapy: Secondary | ICD-10-CM | POA: Diagnosis not present

## 2017-03-04 DIAGNOSIS — M059 Rheumatoid arthritis with rheumatoid factor, unspecified: Secondary | ICD-10-CM | POA: Diagnosis not present

## 2017-04-29 ENCOUNTER — Inpatient Hospital Stay
Admission: EM | Admit: 2017-04-29 | Discharge: 2017-05-01 | DRG: 309 | Disposition: A | Discharge status: Home / Self Care | Payer: BLUE CROSS/BLUE SHIELD | Attending: Internal Medicine | Admitting: Internal Medicine

## 2017-04-29 ENCOUNTER — Emergency Department: Payer: BLUE CROSS/BLUE SHIELD

## 2017-04-29 ENCOUNTER — Encounter: Payer: Self-pay | Admitting: Intensive Care

## 2017-04-29 ENCOUNTER — Observation Stay (HOSPITAL_BASED_OUTPATIENT_CLINIC_OR_DEPARTMENT_OTHER)
Admit: 2017-04-29 | Discharge: 2017-04-29 | Disposition: A | Discharge status: Home / Self Care | Payer: BLUE CROSS/BLUE SHIELD | Attending: Internal Medicine | Admitting: Internal Medicine

## 2017-04-29 DIAGNOSIS — I10 Essential (primary) hypertension: Secondary | ICD-10-CM | POA: Diagnosis present

## 2017-04-29 DIAGNOSIS — I77810 Thoracic aortic ectasia: Secondary | ICD-10-CM | POA: Diagnosis not present

## 2017-04-29 DIAGNOSIS — I4891 Unspecified atrial fibrillation: Secondary | ICD-10-CM

## 2017-04-29 DIAGNOSIS — Z6841 Body Mass Index (BMI) 40.0 and over, adult: Secondary | ICD-10-CM

## 2017-04-29 DIAGNOSIS — M069 Rheumatoid arthritis, unspecified: Secondary | ICD-10-CM | POA: Diagnosis present

## 2017-04-29 DIAGNOSIS — Z880 Allergy status to penicillin: Secondary | ICD-10-CM

## 2017-04-29 DIAGNOSIS — I481 Persistent atrial fibrillation: Secondary | ICD-10-CM | POA: Diagnosis not present

## 2017-04-29 DIAGNOSIS — R000 Tachycardia, unspecified: Secondary | ICD-10-CM | POA: Diagnosis not present

## 2017-04-29 DIAGNOSIS — G4733 Obstructive sleep apnea (adult) (pediatric): Secondary | ICD-10-CM | POA: Diagnosis present

## 2017-04-29 DIAGNOSIS — R079 Chest pain, unspecified: Secondary | ICD-10-CM | POA: Diagnosis not present

## 2017-04-29 DIAGNOSIS — Z888 Allergy status to other drugs, medicaments and biological substances status: Secondary | ICD-10-CM

## 2017-04-29 DIAGNOSIS — Z79899 Other long term (current) drug therapy: Secondary | ICD-10-CM | POA: Diagnosis not present

## 2017-04-29 DIAGNOSIS — Z9119 Patient's noncompliance with other medical treatment and regimen: Secondary | ICD-10-CM | POA: Diagnosis not present

## 2017-04-29 DIAGNOSIS — Z87891 Personal history of nicotine dependence: Secondary | ICD-10-CM | POA: Diagnosis not present

## 2017-04-29 DIAGNOSIS — Z791 Long term (current) use of non-steroidal anti-inflammatories (NSAID): Secondary | ICD-10-CM

## 2017-04-29 DIAGNOSIS — I480 Paroxysmal atrial fibrillation: Principal | ICD-10-CM | POA: Diagnosis present

## 2017-04-29 HISTORY — DX: Gastro-esophageal reflux disease without esophagitis: K21.9

## 2017-04-29 HISTORY — DX: Morbid (severe) obesity due to excess calories: E66.01

## 2017-04-29 HISTORY — DX: Paroxysmal atrial fibrillation: I48.0

## 2017-04-29 HISTORY — DX: Thoracic aortic ectasia: I77.810

## 2017-04-29 HISTORY — DX: Personal history of other medical treatment: Z92.89

## 2017-04-29 HISTORY — DX: Sleep apnea, unspecified: G47.30

## 2017-04-29 LAB — CBC
HEMATOCRIT: 45.9 % (ref 40.0–52.0)
Hemoglobin: 15.9 g/dL (ref 13.0–18.0)
MCH: 26.8 pg (ref 26.0–34.0)
MCHC: 34.7 g/dL (ref 32.0–36.0)
MCV: 77.4 fL — ABNORMAL LOW (ref 80.0–100.0)
PLATELETS: 272 10*3/uL (ref 150–440)
RBC: 5.93 MIL/uL — ABNORMAL HIGH (ref 4.40–5.90)
RDW: 14.7 % — AB (ref 11.5–14.5)
WBC: 8.2 10*3/uL (ref 3.8–10.6)

## 2017-04-29 LAB — BASIC METABOLIC PANEL
ANION GAP: 9 (ref 5–15)
BUN: 12 mg/dL (ref 6–20)
CALCIUM: 9.6 mg/dL (ref 8.9–10.3)
CO2: 24 mmol/L (ref 22–32)
CREATININE: 1.06 mg/dL (ref 0.61–1.24)
Chloride: 105 mmol/L (ref 101–111)
GFR calc non Af Amer: >60 mL/min (ref >60)
Glucose, Bld: 155 mg/dL — ABNORMAL HIGH (ref 65–99)
Potassium: 4 mmol/L (ref 3.5–5.1)
SODIUM: 138 mmol/L (ref 135–145)

## 2017-04-29 LAB — PROTIME-INR
INR: 0.99
PROTHROMBIN TIME: 13 s (ref 11.4–15.2)

## 2017-04-29 LAB — TSH: TSH: 0.614 u[IU]/mL (ref 0.350–4.500)

## 2017-04-29 LAB — HEPARIN LEVEL (UNFRACTIONATED): Heparin Unfractionated: 0.24 IU/mL — ABNORMAL LOW (ref 0.30–0.70)

## 2017-04-29 LAB — TROPONIN I
TROPONIN I: 0.03 ng/mL — AB (ref <0.03)
Troponin I: 0.03 ng/mL (ref <0.03)
Troponin I: 0.04 ng/mL (ref <0.03)

## 2017-04-29 LAB — HEMOGLOBIN A1C
HEMOGLOBIN A1C: 5.4 % (ref 4.8–5.6)
MEAN PLASMA GLUCOSE: 108.28 mg/dL

## 2017-04-29 LAB — MAGNESIUM: Magnesium: 2.1 mg/dL (ref 1.7–2.4)

## 2017-04-29 LAB — APTT: APTT: 28 s (ref 24–36)

## 2017-04-29 MED ORDER — HEPARIN BOLUS VIA INFUSION
1700.0000 [IU] | Freq: Once | INTRAVENOUS | Status: AC
  Administered 2017-04-29: 1700 [IU] via INTRAVENOUS
  Filled 2017-04-29: qty 1700

## 2017-04-29 MED ORDER — HYDROXYCHLOROQUINE SULFATE 200 MG PO TABS
200.0000 mg | ORAL_TABLET | Freq: Every day | ORAL | Status: DC
  Administered 2017-04-29 – 2017-05-01 (×3): 200 mg via ORAL
  Filled 2017-04-29 (×3): qty 1

## 2017-04-29 MED ORDER — NAPROXEN 500 MG PO TABS
500.0000 mg | ORAL_TABLET | Freq: Two times a day (BID) | ORAL | Status: DC | PRN
  Administered 2017-04-30 (×2): 500 mg via ORAL
  Filled 2017-04-29 (×3): qty 1

## 2017-04-29 MED ORDER — ACETAMINOPHEN 650 MG RE SUPP: 650.0000 mg | Freq: Four times a day (QID) | RECTAL | Status: DC | PRN

## 2017-04-29 MED ORDER — ONDANSETRON HCL 4 MG PO TABS: 4.0000 mg | ORAL_TABLET | Freq: Four times a day (QID) | ORAL | Status: DC | PRN

## 2017-04-29 MED ORDER — ASPIRIN 81 MG PO CHEW
324.0000 mg | CHEWABLE_TABLET | Freq: Once | ORAL | Status: AC
  Administered 2017-04-29: 324 mg via ORAL
  Filled 2017-04-29: qty 4

## 2017-04-29 MED ORDER — ASPIRIN 81 MG PO CHEW
81.0000 mg | CHEWABLE_TABLET | Freq: Every day | ORAL | Status: DC
Start: 2017-04-30 — End: 2017-05-01
  Administered 2017-04-30 – 2017-05-01 (×2): 81 mg via ORAL
  Filled 2017-04-29 (×2): qty 1

## 2017-04-29 MED ORDER — SODIUM CHLORIDE 0.9 % IV BOLUS (SEPSIS)
500.0000 mL | Freq: Once | INTRAVENOUS | Status: AC
  Administered 2017-04-29: 500 mL via INTRAVENOUS

## 2017-04-29 MED ORDER — ACETAMINOPHEN 325 MG PO TABS: 650.0000 mg | ORAL_TABLET | Freq: Four times a day (QID) | ORAL | Status: DC | PRN

## 2017-04-29 MED ORDER — SODIUM CHLORIDE 0.9% FLUSH
3.0000 mL | Freq: Two times a day (BID) | INTRAVENOUS | Status: DC
  Administered 2017-04-29 – 2017-05-01 (×3): 3 mL via INTRAVENOUS

## 2017-04-29 MED ORDER — METOPROLOL TARTRATE 5 MG/5ML IV SOLN
5.0000 mg | Freq: Once | INTRAVENOUS | Status: AC
  Administered 2017-04-29: 5 mg via INTRAVENOUS
  Filled 2017-04-29: qty 5

## 2017-04-29 MED ORDER — DEXTROSE 5 % IV SOLN
5.0000 mg/h | Freq: Once | INTRAVENOUS | Status: DC
  Filled 2017-04-29: qty 100

## 2017-04-29 MED ORDER — HEPARIN BOLUS VIA INFUSION
4000.0000 [IU] | Freq: Once | INTRAVENOUS | Status: AC
  Administered 2017-04-29: 4000 [IU] via INTRAVENOUS
  Filled 2017-04-29: qty 4000

## 2017-04-29 MED ORDER — POLYETHYLENE GLYCOL 3350 17 G PO PACK: 17.0000 g | PACK | Freq: Every day | ORAL | Status: DC | PRN

## 2017-04-29 MED ORDER — HEPARIN (PORCINE) IN NACL 100-0.45 UNIT/ML-% IJ SOLN
2300.0000 [IU]/h | INTRAMUSCULAR | Status: DC
  Administered 2017-04-29: 1750 [IU]/h via INTRAVENOUS
  Filled 2017-04-29 (×5): qty 250

## 2017-04-29 MED ORDER — METOPROLOL TARTRATE 50 MG PO TABS
50.0000 mg | ORAL_TABLET | Freq: Once | ORAL | Status: AC
  Administered 2017-04-29: 50 mg via ORAL
  Filled 2017-04-29: qty 1

## 2017-04-29 MED ORDER — HEPARIN (PORCINE) IN NACL 100-0.45 UNIT/ML-% IJ SOLN: 12.0000 [IU]/kg/h | Freq: Once | INTRAMUSCULAR | Status: DC

## 2017-04-29 MED ORDER — ALBUTEROL SULFATE (2.5 MG/3ML) 0.083% IN NEBU: 2.5000 mg | INHALATION_SOLUTION | RESPIRATORY_TRACT | Status: DC | PRN

## 2017-04-29 MED ORDER — DILTIAZEM HCL 100 MG IV SOLR
5.0000 mg/h | INTRAVENOUS | Status: DC
  Administered 2017-04-29 – 2017-04-30 (×2): 5 mg/h via INTRAVENOUS
  Filled 2017-04-29 (×3): qty 100

## 2017-04-29 MED ORDER — ONDANSETRON HCL 4 MG/2ML IJ SOLN: 4.0000 mg | Freq: Four times a day (QID) | INTRAMUSCULAR | Status: DC | PRN

## 2017-04-29 MED ORDER — HEPARIN SODIUM (PORCINE) 5000 UNIT/ML IJ SOLN: 4000.0000 [IU] | Freq: Once | INTRAMUSCULAR | Status: DC

## 2017-04-29 MED ORDER — SODIUM CHLORIDE 0.9 % IV SOLN
Freq: Once | INTRAVENOUS | Status: AC
  Administered 2017-04-29: 10:00:00 via INTRAVENOUS

## 2017-04-29 NOTE — ED Triage Notes (Signed)
Patient arrived by EMS from work for chest soreness. Patient was in A-fib with rate of 180. EMS administered metoprolol 5mg  and arrived to ER with HR in 130s. Patient denies any chest pain at this time. EMS vitals 118/86, 96% RA. Pt reports similar episode X8 years ago. A&O x4

## 2017-04-29 NOTE — Consult Note (Signed)
Cardiology Consult    Patient ID: Jeremy West MRN: [id number], DOB/AGE: January 02, 1966   Admit date: 04/29/2017 Date of Consult: 04/29/2017  Primary Physician: Margaretann Loveless, PA-C Primary Cardiologist: New - C. End, MD   Requesting Provider: S. Sudini, MD  Patient Profile    Jeremy West is a 51 y.o. male with a history of PAF, HTN, obesity, OSA (not using CPAP), and rheumatoid arthritis, who is being seen today for the evaluation of Afib w/ RVR at the request of Dr. Elpidio Anis.  Past Medical History   Past Medical History:  Diagnosis Date  . Arthritis   . GERD (gastroesophageal reflux disease)   . Hypertension   . Morbid obesity (HCC)   . PAF (paroxysmal atrial fibrillation) (HCC)    a. Seen in 01/2010; b. CHA2DS2VASc = 1.  . Rheumatoid arthritis (HCC)   . Sleep apnea     Past Surgical History:  Procedure Laterality Date  . ARTHROTOMY N/A   . COLONOSCOPY WITH PROPOFOL N/A 03/12/2015   Procedure: COLONOSCOPY WITH PROPOFOL;  Surgeon: Scot Jun, MD;  Location: [facility] ENDOSCOPY;  Service: Endoscopy;  Laterality: N/A;  . complex repair leg    . ESOPHAGOGASTRODUODENOSCOPY (EGD) WITH PROPOFOL N/A 03/12/2015   Procedure: ESOPHAGOGASTRODUODENOSCOPY (EGD) WITH PROPOFOL;  Surgeon: Scot Jun, MD;  Location: [facility] ENDOSCOPY;  Service: Endoscopy;  Laterality: N/A;     Allergies  Allergies  Allergen Reactions  . Penicillins Anaphylaxis    Childhood reaction Has patient had a PCN reaction causing immediate rash, facial/tongue/throat swelling, SOB or lightheadedness with hypotension: Unknown Has patient had a PCN reaction causing severe rash involving mucus membranes or skin necrosis: Unknown Has patient had a PCN reaction that required hospitalization: Unknown Has patient had a PCN reaction occurring within the last 10 years: No If all of the above answers are "NO", then may proceed with Cephalosporin use.   . Adalimumab     History of Present Illness      51 y/o ? with a h/o obesity, OSA (not using CPAP - machine broken and needs re-eval), HTN, and rheumatoid arthritis.  He also has a h/o PAF, which was first Dx in 01/2010.  He was seen in the ED @ that time with chest pain and found to be in rapid Afib.  He converted to sinus while still in the ED and subsequently discharged.  He has never f/u with cardiology.  To the best of knowledge, he had not had any recurrence of Afib over the years.  Last year, he lost about 30 lbs intentionally, but then had to go on a brief course of prednisone for RA flair, and ended up gaining all of the weight back.  He also changed jobs since then and hasn't been able to exercise.  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  He was in his usoh until this AM, when shortly after awakening @ around 6a, he noted moderate retrosternal chest discomfort, which he felt was similar to his last episode of afib.  He also noted mild nausea and so he went to [employer] and got 2 biscuits.  Nausea resolved.  He then went to work and saw the nurse on staff.  ECG showed rapid afib.  EMS was called and he was transported to [facility] for further eval.  Here, he was placed on IV Dilt, heparin, and also provided IV metoprolol. Trop 0.03 x 2.  Rates improved to 1-teens and c/p resolved.  Currently pain free.  Inpatient Medications    . hydroxychloroquine  200 mg Oral Daily  . sodium chloride flush  3 mL Intravenous Q12H    Family History    Family History  Problem Relation Age of Onset  . Cancer Mother        died in his 86s  . Cancer Father        died in his 29s    Social History    Social History   Social History  . Marital status: Single    Spouse name: N/A  . Number of children: N/A  . Years of education: N/A   Occupational History  .      Works for city of [employer]  .      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   Social History Main Topics  . Smoking status: Never Smoker  . Smokeless  tobacco: Former Neurosurgeon  . Alcohol use 0.6 oz/week    1 Cans of beer per week     Comment: rare  . Drug use: No  . Sexual activity: Not on file   Other Topics Concern  . Not on file   Social History Narrative   Lives in [place] by himself.  Was walking regularly about a year ago and lost 30 lbs, but has since changed jobs and has gained back weight.     Review of Systems    General:  No chills, fever, night sweats or weight changes.  Cardiovascular:  +++ chest pain, no dyspnea on exertion, edema, orthopnea, palpitations, paroxysmal nocturnal dyspnea. Dermatological: No rash, lesions/masses Respiratory: No cough, dyspnea Urologic: No hematuria, dysuria Abdominal:   +++ nausea this AM, no vomiting, diarrhea, bright red blood per rectum, melena, or hematemesis Neurologic:  No visual changes, wkns, changes in mental status. All other systems reviewed and are otherwise negative except as noted above.  Physical Exam    Blood pressure 115/72, pulse 80, temperature 97.8 F (36.6 C), temperature source Oral, resp. rate 20, height 6' (1.829 m), weight (!) 350 lb (158.8 kg), SpO2 95 %.  General: Pleasant, NAD Psych: Normal affect. Neuro: Alert and oriented X 3. Moves all extremities spontaneously. HEENT: Normal  Neck: Supple without bruits or JVD. Lungs:  Resp regular and unlabored, CTA. Heart: IR, IR no s3, s4, or murmurs. Abdomen: Soft, non-tender, non-distended, BS + x 4.  Extremities: No clubbing, cyanosis or edema. DP/PT/Radials 2+ and equal bilaterally.  Labs     Recent Labs  04/29/17 0917 04/29/17 1342  TROPONINI 0.03* 0.03*   Lab Results  Component Value Date   WBC 8.2 04/29/2017   HGB 15.9 04/29/2017   HCT 45.9 04/29/2017   MCV 77.4 (L) 04/29/2017   PLT 272 04/29/2017    Recent Labs Lab 04/29/17 0917  NA 138  K 4.0  CL 105  CO2 24  BUN 12  CREATININE 1.06  CALCIUM 9.6  GLUCOSE 155*    Radiology Studies    Dg Chest Port 1 View  Result Date:  04/29/2017 CLINICAL DATA:  Chest pain EXAM: PORTABLE CHEST 1 VIEW COMPARISON:  11/15/2013 chest radiograph. FINDINGS: Low lung volumes. Stable cardiomediastinal silhouette with top-normal heart size. No pneumothorax. No pleural effusion. Lungs appear clear, with no acute consolidative airspace disease and no pulmonary edema. IMPRESSION: Hypoinspiratory chest radiograph with no active cardiopulmonary disease. Electronically Signed   By: Delbert Phenix M.D.   On: 04/29/2017 09:37    ECG & Cardiac Imaging    Afib, 129, no acute  st/t changes.  Assessment & Plan    1.  Afib RVR:  Pt with prior h/o PAF dating back to 01/2010 - only other prior episode that he is aware of.  At that time, he converted spontaneously in the ED.  He has never been seen by cardiology.  He developed chest pain after awakening this AM, which he recognized as being similar to his prior episode of Afib.  He was in fact found to be in rapid AFib by an occupational health nurse and was subsequently taken to the ED via EMS.  Here, he has received IV lopressor and placed on IV dilt with improved rates (1-teens) and resolution of chest pain.  He is currently on heparin.  Cont IV dilt and heparin.  Keep NPO after midnight.  If he is still in Afib tomorrow, we will plan to cardiovert.  If he converts, plan to switch dilt to PO and potentially d/c.  CHA2DS2VASc = 1.  Adding ASA.  Echo pending.  TSH/Lytes wnl.  Needs treatment of OSA and wt loss.  2.  Elevated troponin:  0.03 x 2.  Likely demand ischemia in the setting of rapid afib.  He did have c/p, which has since resolved.  Cont to cycle CE.  If trend remains flat, and echo shows nl EF, will plan outpt stress testing.  Add ASA.   3.  OSA:  CPAP broke and he hasn't been able to get it replaced.  He will need outpt sleep eval to get him a new machine.  He says that he may go to [id number] to have this done.    4.  Obesity:  Wt loss will play important role in mgmt of afib going forward.  Would  benefit from outpt nutritional counseling.  5.  RA:  Cont home meds.  Signed, Nicolasa Ducking, NP 04/29/2017, 3:42 PM  For questions or updates, please contact   Please consult [email or web address] for contact info under Cardiology/STEMI.

## 2017-04-29 NOTE — Progress Notes (Addendum)
ANTICOAGULATION CONSULT NOTE - Initial Consult  Pharmacy Consult for Heparin Drip Indication: atrial fibrillation  Allergies  Allergen Reactions  . Penicillins Anaphylaxis    Childhood reaction Has patient had a PCN reaction causing immediate rash, facial/tongue/throat swelling, SOB or lightheadedness with hypotension: Unknown Has patient had a PCN reaction causing severe rash involving mucus membranes or skin necrosis: Unknown Has patient had a PCN reaction that required hospitalization: Unknown Has patient had a PCN reaction occurring within the last 10 years: No If all of the above answers are "NO", then may proceed with Cephalosporin use.   . Adalimumab     Patient Measurements: Height: 6' (182.9 cm) Weight: (!) 350 lb (158.8 kg) IBW/kg (Calculated) : 77.6 Heparin Dosing Weight: 115.5 kg  Vital Signs: Temp: 98.8 F (37.1 C) (10/10 0911) Temp Source: Oral (10/10 0911) BP: 116/76 (10/10 0918) Pulse Rate: 131 (10/10 0911)  Labs:  Recent Labs  04/29/17 0917  HGB 15.9  HCT 45.9  PLT 272  CREATININE 1.06  TROPONINI 0.03*    Estimated Creatinine Clearance: 128.4 mL/min (by C-G formula based on SCr of 1.06 mg/dL).   Medical History: Past Medical History:  Diagnosis Date  . Acid reflux   . Arthritis   . Hypertension   . Rheumatoid arthritis (HCC)     Medications:  Scheduled:  . heparin  4,000 Units Intravenous Once  . metoprolol tartrate  50 mg Oral Once   Infusions:  . heparin      Assessment: 51 yo M to start Heparin drip for Afib.  Per Med Rec patient is not on anticoagulants at home. Hgb 15.9  Plt 272  INR 0.99   APTT 28  Goal of Therapy:  Heparin level 0.3-0.7 units/ml Monitor platelets by anticoagulation protocol: Yes   Plan:  Give 4000 units bolus x 1 Start heparin infusion at 1750 units/hr Check anti-Xa level in 6 hours and daily while on heparin Continue to monitor H&H and platelets  Olena Willy A 04/29/2017,10:19 AM

## 2017-04-29 NOTE — Clinical Social Work Note (Signed)
CSW received consult that patient is having difficulty getting medications.  Case management can assist with this, CSW to sign off.  Jones Broom. [place], MSW, [place]  04/29/2017 1:12 PM

## 2017-04-29 NOTE — ED Notes (Signed)
Pt denies chest pain at this time.

## 2017-04-29 NOTE — ED Provider Notes (Signed)
[facility] Emergency Department Provider Note       Time seen: ----------------------------------------- 9:21 AM on 04/29/2017 -----------------------------------------     I have reviewed the triage vital signs and the nursing notes.   HISTORY   Chief Complaint Chest Pain    HPI Jeremy West is a 51 y.o. male with a history of rheumatoid arthritis, hypertension and GERD who presents to the ED for chest soreness. Patient aroused by EMS from work for chest soreness when EMS arrived he was found to be in A. fib with a rate of 180 bpm. EMS administered, grams metoprolol and a rapid heart rate in the 130s. He denies any chest pain this time. He denies recent illness or other complaints.  Past Medical History:  Diagnosis Date  . Acid reflux   . Arthritis   . Hypertension   . Rheumatoid arthritis ([place])     There are no active problems to display for this patient.   Past Surgical History:  Procedure Laterality Date  . ARTHROTOMY N/A   . COLONOSCOPY WITH PROPOFOL N/A 03/12/2015   Procedure: COLONOSCOPY WITH PROPOFOL;  Surgeon: Manya Silvas, MD;  Location: [facility] ENDOSCOPY;  Service: Endoscopy;  Laterality: N/A;  . complex repair leg    . ESOPHAGOGASTRODUODENOSCOPY (EGD) WITH PROPOFOL N/A 03/12/2015   Procedure: ESOPHAGOGASTRODUODENOSCOPY (EGD) WITH PROPOFOL;  Surgeon: Manya Silvas, MD;  Location: [facility] ENDOSCOPY;  Service: Endoscopy;  Laterality: N/A;    Allergies Adalimumab and Penicillins  Social History Social History  Substance Use Topics  . Smoking status: Never Smoker  . Smokeless tobacco: Former Systems developer  . Alcohol use 0.6 oz/week    1 Cans of beer per week    Review of Systems Constitutional: Negative for fever. Cardiovascular:positive for chest pain, palpitations Respiratory: Negative for shortness of breath. Gastrointestinal: Negative for abdominal pain, vomiting and diarrhea. Genitourinary: Negative for  dysuria. Musculoskeletal: Negative for back pain. Skin: Negative for rash. Neurological: Negative for headaches, focal weakness or numbness.  All systems negative/normal/unremarkable except as stated in the HPI  ____________________________________________   PHYSICAL EXAM:  VITAL SIGNS: ED Triage Vitals  Enc Vitals Group     BP 04/29/17 0918 116/76     Pulse Rate 04/29/17 0911 (!) 131     Resp 04/29/17 0911 14     Temp 04/29/17 0911 98.8 F (37.1 C)     Temp Source 04/29/17 0911 Oral     SpO2 04/29/17 0911 94 %     Weight 04/29/17 0913 (!) 350 lb (158.8 kg)     Height 04/29/17 0913 6' (1.829 m)     Head Circumference --      Peak Flow --      Pain Score --      Pain Loc --      Pain Edu? --      Excl. in [place]? --    Constitutional: Alert and oriented. Well appearing and in no distress. Eyes: Conjunctivae are normal. Normal extraocular movements. ENT   Head: Normocephalic and atraumatic.   Nose: No congestion/rhinnorhea.   Mouth/Throat: Mucous membranes are moist.   Neck: No stridor. Cardiovascular: Normal rate, regular rhythm. No murmurs, rubs, or gallops. Respiratory: Normal respiratory effort without tachypnea nor retractions. Breath sounds are clear and equal bilaterally. No wheezes/rales/rhonchi. Gastrointestinal: Soft and nontender. Normal bowel sounds Musculoskeletal: Nontender with normal range of motion in extremities. No lower extremity tenderness nor edema. Neurologic:  Normal speech and language. No gross focal neurologic deficits are appreciated.  Skin:  Skin is warm, dry and intact. No rash noted. Psychiatric: Mood and affect are normal. Speech and behavior are normal.  ____________________________________________  EKG: Interpreted by me.atrial fibrillation with a rapid ventricular response, rate is 129 bpm, normal QRS, normal QT.  ____________________________________________  ED COURSE:  Pertinent labs & imaging results that were available  during my care of the patient were reviewed by me and considered in my medical decision making (see chart for details). Patient presents for chest tightness and tachycardia, we will assess with labs and imaging as indicated.   Procedures ____________________________________________   LABS (pertinent positives/negatives)  Labs Reviewed  CBC - Abnormal; Notable for the following:       Result Value   RBC 5.93 (*)    MCV 77.4 (*)    RDW 14.7 (*)    All other components within normal limits  BASIC METABOLIC PANEL - Abnormal; Notable for the following:    Glucose, Bld 155 (*)    All other components within normal limits  TROPONIN I - Abnormal; Notable for the following:    Troponin I 0.03 (*)    All other components within normal limits   CRITICAL CARE Performed by: Earleen Newport   Total critical care time: 30 minutes  Critical care time was exclusive of separately billable procedures and treating other patients.  Critical care was necessary to treat or prevent imminent or life-threatening deterioration.  Critical care was time spent personally by me on the following activities: development of treatment plan with patient and/or surrogate as well as nursing, discussions with consultants, evaluation of patient's response to treatment, examination of patient, obtaining history from patient or surrogate, ordering and performing treatments and interventions, ordering and review of laboratory studies, ordering and review of radiographic studies, pulse oximetry and re-evaluation of patient's condition.  RADIOLOGY Images were viewed by me  chest x-ray IMPRESSION: Hypoinspiratory chest radiograph with no active cardiopulmonary disease. ____________________________________________  DIFFERENTIAL DIAGNOSIS   unstable angina, tachycardia dysrhythmia, electrolyte abnormality, valvular disease, pulmonary disease, thyroid dysfunction  FINAL ASSESSMENT AND PLAN  chest pain, atrial  fibrillation with a rapid ventricular response   Plan: Patient had presented for chest soreness found to be in A. fib with RVR. Patients labs revealed a slightly elevated troponin 0.03. He did have a similar level last year. Patients imaging was negative for any acute process in his chest. after an additional 5 mg of Lopressor heart rate remains in the low 100s with stable blood pressure. He will be placed on heparin and Cardizem drip. He remained stable for cardiology consultation and observation the hospital.   Earleen Newport, MD   Note: This note was generated in part or whole with voice recognition software. Voice recognition is usually quite accurate but there are transcription errors that can and very often do occur. I apologize for any typographical errors that were not detected and corrected.     Earleen Newport, MD 04/29/17 [phone]

## 2017-04-29 NOTE — ED Notes (Addendum)
Attempted report. Christine Network engineer reported RN will call back

## 2017-04-29 NOTE — Progress Notes (Signed)
ANTICOAGULATION CONSULT NOTE - Initial Consult  Pharmacy Consult for Heparin Drip Indication: atrial fibrillation  Allergies  Allergen Reactions  . Penicillins Anaphylaxis    Childhood reaction Has patient had a PCN reaction causing immediate rash, facial/tongue/throat swelling, SOB or lightheadedness with hypotension: Unknown Has patient had a PCN reaction causing severe rash involving mucus membranes or skin necrosis: Unknown Has patient had a PCN reaction that required hospitalization: Unknown Has patient had a PCN reaction occurring within the last 10 years: No If all of the above answers are "NO", then may proceed with Cephalosporin use.   . Adalimumab     Patient Measurements: Height: 6' (182.9 cm) Weight: (!) 350 lb (158.8 kg) IBW/kg (Calculated) : 77.6 Heparin Dosing Weight: 115.5 kg  Vital Signs: Temp: 97.8 F (36.6 C) (10/10 1153) Temp Source: Oral (10/10 1153) BP: 119/67 (10/10 1858) Pulse Rate: 89 (10/10 1858)  Labs:  Recent Labs  04/29/17 0917 04/29/17 1016 04/29/17 1342 04/29/17 1650  HGB 15.9  --   --   --   HCT 45.9  --   --   --   PLT 272  --   --   --   APTT  --  28  --   --   LABPROT  --  13.0  --   --   INR  --  0.99  --   --   HEPARINUNFRC  --   --   --  0.24*  CREATININE 1.06  --   --   --   TROPONINI 0.03*  --  0.03*  --     Estimated Creatinine Clearance: 128.4 mL/min (by C-G formula based on SCr of 1.06 mg/dL).   Medical History: Past Medical History:  Diagnosis Date  . Arthritis   . GERD (gastroesophageal reflux disease)   . Hypertension   . Morbid obesity ([place])   . PAF (paroxysmal atrial fibrillation) ([place])    a. Seen in 01/2010; b. CHA2DS2VASc = 1.  . Rheumatoid arthritis ([place])   . Sleep apnea     Medications:  Scheduled:  . [START ON 04/30/2017] aspirin  81 mg Oral Daily  . heparin  1,700 Units Intravenous Once  . hydroxychloroquine  200 mg Oral Daily  . sodium chloride flush  3 mL Intravenous Q12H   Infusions:  .  diltiazem (CARDIZEM) infusion 5 mg/hr (04/29/17 1309)  . heparin 1,750 Units/hr (04/29/17 1034)    Assessment: 51 yo M to start Heparin drip for Afib.  Per Med Rec patient is not on anticoagulants at home. Hgb 15.9  Plt 272  INR 0.99   APTT 28  Goal of Therapy:  Heparin level 0.3-0.7 units/ml Monitor platelets by anticoagulation protocol: Yes   Plan:  Give 4000 units bolus x 1 Start heparin infusion at 1750 units/hr Check anti-Xa level in 6 hours and daily while on heparin Continue to monitor H&H and platelets   10/10:  HL @ 16:50 = 0.24 Will order Heparin 1700 units IV bolus X 1 and increase drip rate to 1950 units/hr. Will recheck HL 6 hrs after rate change.    Jeremy West D 04/29/2017,7:43 PM

## 2017-04-29 NOTE — H&P (Signed)
[employer] - [place] at [facility]   PATIENT NAME: Jeremy West    MR#:  [id number]  DATE OF BIRTH:  19-May-1966  DATE OF ADMISSION:  04/29/2017  PRIMARY CARE PHYSICIAN: Margaretann Loveless, PA-C   REQUESTING/REFERRING PHYSICIAN: Dr. Mayford Knife  CHIEF COMPLAINT:   Chief Complaint  Patient presents with  . Chest Pain    HISTORY OF PRESENT ILLNESS:  Jeremy West  is a 51 y.o. male with a known history of Afib, HTN, RA, sleep apnea here with chest pain at work. EMS was called and found to have Afib in 180s. Given 5 lopressor IV and was down to 180s. Another 5 mg given in ED, Still tachycardic. Chest pain is better. He is aware of an episode of Afib 8 yrs back but nothing after that. Was in the emergency room and converted to normal sinus rhythm and discharged home. Not on aspirin or any anticoagulants. No bleeding. Has sleep apnea but has not used his CPAP machine in a few years.  PAST MEDICAL HISTORY:   Past Medical History:  Diagnosis Date  . Acid reflux   . Arthritis   . Hypertension   . Rheumatoid arthritis (HCC)   . Sleep apnea     PAST SURGICAL HISTORY:   Past Surgical History:  Procedure Laterality Date  . ARTHROTOMY N/A   . COLONOSCOPY WITH PROPOFOL N/A 03/12/2015   Procedure: COLONOSCOPY WITH PROPOFOL;  Surgeon: Scot Jun, MD;  Location: [facility] ENDOSCOPY;  Service: Endoscopy;  Laterality: N/A;  . complex repair leg    . ESOPHAGOGASTRODUODENOSCOPY (EGD) WITH PROPOFOL N/A 03/12/2015   Procedure: ESOPHAGOGASTRODUODENOSCOPY (EGD) WITH PROPOFOL;  Surgeon: Scot Jun, MD;  Location: [facility] ENDOSCOPY;  Service: Endoscopy;  Laterality: N/A;    SOCIAL HISTORY:   Social History  Substance Use Topics  . Smoking status: Never Smoker  . Smokeless tobacco: Former Neurosurgeon  . Alcohol use 0.6 oz/week    1 Cans of beer per week    FAMILY HISTORY:   Family History  Problem Relation Age of Onset  . Cancer Mother   . Cancer Father      DRUG ALLERGIES:   Allergies  Allergen Reactions  . Penicillins Anaphylaxis    Childhood reaction Has patient had a PCN reaction causing immediate rash, facial/tongue/throat swelling, SOB or lightheadedness with hypotension: Unknown Has patient had a PCN reaction causing severe rash involving mucus membranes or skin necrosis: Unknown Has patient had a PCN reaction that required hospitalization: Unknown Has patient had a PCN reaction occurring within the last 10 years: No If all of the above answers are "NO", then may proceed with Cephalosporin use.   . Adalimumab     REVIEW OF SYSTEMS:   Review of Systems  Constitutional: Positive for malaise/fatigue. Negative for chills and fever.  HENT: Negative for sore throat.   Eyes: Negative for blurred vision, double vision and pain.  Respiratory: Negative for cough, hemoptysis, shortness of breath and wheezing.   Cardiovascular: Positive for chest pain and palpitations. Negative for orthopnea and leg swelling.  Gastrointestinal: Negative for abdominal pain, constipation, diarrhea, heartburn, nausea and vomiting.  Genitourinary: Negative for dysuria and hematuria.  Musculoskeletal: Negative for back pain and joint pain.  Skin: Negative for rash.  Neurological: Positive for weakness. Negative for sensory change, speech change, focal weakness and headaches.  Endo/Heme/Allergies: Does not bruise/bleed easily.  Psychiatric/Behavioral: Negative for depression. The patient is not nervous/anxious.     MEDICATIONS AT HOME:   Prior to Admission medications  Medication Sig Start Date End Date Taking? Authorizing Provider  hydroxychloroquine (PLAQUENIL) 200 MG tablet Take 200 mg by mouth daily. 02/05/17  Yes [provider]  losartan-hydrochlorothiazide (HYZAAR) 100-25 MG tablet Take 1 tablet by mouth every morning. 03/30/17  Yes [provider]  naproxen (NAPROSYN) 500 MG tablet Take 500 mg by mouth 2 (two) times daily with a  meal.   Yes [provider]     VITAL SIGNS:  Blood pressure 97/61, pulse 88, temperature 98.8 F (37.1 C), temperature source Oral, resp. rate 20, height 6' (1.829 m), weight (!) 158.8 kg (350 lb), SpO2 98 %.  PHYSICAL EXAMINATION:  Physical Exam  GENERAL:  51 y.o.-year-old patient lying in the bed with no acute distress.  EYES: Pupils equal, round, reactive to light and accommodation. No scleral icterus. Extraocular muscles intact.  HEENT: Head atraumatic, normocephalic. Oropharynx and nasopharynx clear. No oropharyngeal erythema, moist oral mucosa  NECK:  Supple, no jugular venous distention. No thyroid enlargement, no tenderness.  LUNGS: Normal breath sounds bilaterally, no wheezing, rales, rhonchi. No use of accessory muscles of respiration.  CARDIOVASCULAR: S1, S2 tachycardia, irregularly irregular. No murmurs, rubs, or gallops.  ABDOMEN: Soft, nontender, nondistended. Bowel sounds present. No organomegaly or mass.  EXTREMITIES: No pedal edema, cyanosis, or clubbing. + 2 pedal & radial pulses b/l.   NEUROLOGIC: Cranial nerves II through XII are intact. No focal Motor or sensory deficits appreciated b/l PSYCHIATRIC: The patient is alert and oriented x 3. Good affect.  SKIN: No obvious rash, lesion, or ulcer.   LABORATORY PANEL:   CBC  Recent Labs Lab 04/29/17 0917  WBC 8.2  HGB 15.9  HCT 45.9  PLT 272   ------------------------------------------------------------------------------------------------------------------  Chemistries   Recent Labs Lab 04/29/17 0917  NA 138  K 4.0  CL 105  CO2 24  GLUCOSE 155*  BUN 12  CREATININE 1.06  CALCIUM 9.6   ------------------------------------------------------------------------------------------------------------------  Cardiac Enzymes  Recent Labs Lab 04/29/17 0917  TROPONINI 0.03*    ------------------------------------------------------------------------------------------------------------------  RADIOLOGY:  Dg Chest Port 1 View  Result Date: 04/29/2017 CLINICAL DATA:  Chest pain EXAM: PORTABLE CHEST 1 VIEW COMPARISON:  11/15/2013 chest radiograph. FINDINGS: Low lung volumes. Stable cardiomediastinal silhouette with top-normal heart size. No pneumothorax. No pleural effusion. Lungs appear clear, with no acute consolidative airspace disease and no pulmonary edema. IMPRESSION: Hypoinspiratory chest radiograph with no active cardiopulmonary disease. Electronically Signed   By: Delbert Phenix M.D.   On: 04/29/2017 09:37     IMPRESSION AND PLAN:   * Paroxysmal Afib Not clear if he has been having Afib episodes in the past few years. Will start Lopressor 50 BID. Heparin drip. Consult cardiology Check Echo. epeat troponin If doesn't return to NSR cardioversion per cardiology as OP  * HTN Started on lopressor Will d/c HCTZ  * Rheumatoid arthritis  * DVT prophylaxis On heparin   All the records are reviewed and case discussed with ED provider. Management plans discussed with the patient, family and they are in agreement.  CODE STATUS: FULL CODE  TOTAL TIME TAKING CARE OF THIS PATIENT: 40 minutes.   Milagros Loll R M.D on 04/29/2017 at 10:27 AM  Between 7am to 6pm - Pager - [phone]  After 6pm go to [email or web address] - password [email or web address] [facility]  [facility] [place] [facility]  Office  [phone]  CC: Primary care physician; Margaretann Loveless, PA-C  Note: This dictation was prepared with Dragon dictation along with smaller phrase technology. Any transcriptional errors that result from this process  are unintentional.

## 2017-04-30 ENCOUNTER — Encounter: Payer: Self-pay | Admitting: Nurse Practitioner

## 2017-04-30 DIAGNOSIS — Z79899 Other long term (current) drug therapy: Secondary | ICD-10-CM | POA: Diagnosis not present

## 2017-04-30 DIAGNOSIS — I10 Essential (primary) hypertension: Secondary | ICD-10-CM | POA: Diagnosis not present

## 2017-04-30 DIAGNOSIS — I480 Paroxysmal atrial fibrillation: Secondary | ICD-10-CM | POA: Diagnosis not present

## 2017-04-30 DIAGNOSIS — I4891 Unspecified atrial fibrillation: Secondary | ICD-10-CM | POA: Diagnosis not present

## 2017-04-30 DIAGNOSIS — Z87891 Personal history of nicotine dependence: Secondary | ICD-10-CM | POA: Diagnosis not present

## 2017-04-30 DIAGNOSIS — Z888 Allergy status to other drugs, medicaments and biological substances status: Secondary | ICD-10-CM | POA: Diagnosis not present

## 2017-04-30 DIAGNOSIS — I481 Persistent atrial fibrillation: Secondary | ICD-10-CM | POA: Diagnosis present

## 2017-04-30 DIAGNOSIS — R079 Chest pain, unspecified: Secondary | ICD-10-CM | POA: Diagnosis not present

## 2017-04-30 DIAGNOSIS — Z880 Allergy status to penicillin: Secondary | ICD-10-CM | POA: Diagnosis not present

## 2017-04-30 DIAGNOSIS — Z9119 Patient's noncompliance with other medical treatment and regimen: Secondary | ICD-10-CM | POA: Diagnosis not present

## 2017-04-30 DIAGNOSIS — I77810 Thoracic aortic ectasia: Secondary | ICD-10-CM | POA: Diagnosis present

## 2017-04-30 DIAGNOSIS — Z6841 Body Mass Index (BMI) 40.0 and over, adult: Secondary | ICD-10-CM | POA: Diagnosis not present

## 2017-04-30 DIAGNOSIS — G4733 Obstructive sleep apnea (adult) (pediatric): Secondary | ICD-10-CM | POA: Diagnosis not present

## 2017-04-30 DIAGNOSIS — Z791 Long term (current) use of non-steroidal anti-inflammatories (NSAID): Secondary | ICD-10-CM | POA: Diagnosis not present

## 2017-04-30 DIAGNOSIS — M069 Rheumatoid arthritis, unspecified: Secondary | ICD-10-CM | POA: Diagnosis present

## 2017-04-30 LAB — HEPARIN LEVEL (UNFRACTIONATED)
HEPARIN UNFRACTIONATED: 0.67 [IU]/mL (ref 0.30–0.70)
HEPARIN UNFRACTIONATED: 0.38 [IU]/mL (ref 0.30–0.70)
Heparin Unfractionated: 0.33 IU/mL (ref 0.30–0.70)
Heparin Unfractionated: 0.18 IU/mL — ABNORMAL LOW (ref 0.30–0.70)

## 2017-04-30 LAB — CBC
HEMATOCRIT: 42.1 % (ref 40.0–52.0)
Hemoglobin: 14.7 g/dL (ref 13.0–18.0)
MCH: 27.1 pg (ref 26.0–34.0)
MCHC: 35 g/dL (ref 32.0–36.0)
MCV: 77.5 fL — AB (ref 80.0–100.0)
Platelets: 226 10*3/uL (ref 150–440)
RBC: 5.43 MIL/uL (ref 4.40–5.90)
RDW: 14.9 % — AB (ref 11.5–14.5)
WBC: 7.5 10*3/uL (ref 3.8–10.6)

## 2017-04-30 LAB — ECHOCARDIOGRAM COMPLETE
HEIGHTINCHES: 72 in
Weight: 5600 oz

## 2017-04-30 LAB — HIV ANTIBODY (ROUTINE TESTING W REFLEX): HIV SCREEN 4TH GENERATION: NONREACTIVE

## 2017-04-30 MED ORDER — HEPARIN BOLUS VIA INFUSION
3000.0000 [IU] | Freq: Once | INTRAVENOUS | Status: AC
  Administered 2017-04-30: 3000 [IU] via INTRAVENOUS
  Filled 2017-04-30: qty 3000

## 2017-04-30 MED ORDER — KETOROLAC TROMETHAMINE 15 MG/ML IJ SOLN
15.0000 mg | Freq: Once | INTRAMUSCULAR | Status: AC
  Administered 2017-04-30: 15 mg via INTRAVENOUS
  Filled 2017-04-30: qty 1

## 2017-04-30 MED ORDER — HYDROCODONE-ACETAMINOPHEN 5-325 MG PO TABS
1.0000 | ORAL_TABLET | Freq: Once | ORAL | Status: AC
  Administered 2017-05-01: 1 via ORAL
  Filled 2017-04-30: qty 1

## 2017-04-30 MED ORDER — METOPROLOL TARTRATE 25 MG PO TABS
25.0000 mg | ORAL_TABLET | Freq: Four times a day (QID) | ORAL | Status: DC
  Administered 2017-04-30 (×3): 25 mg via ORAL
  Filled 2017-04-30 (×3): qty 1

## 2017-04-30 NOTE — Progress Notes (Signed)
ANTICOAGULATION CONSULT NOTE - Initial Consult  Pharmacy Consult for Heparin Drip Indication: atrial fibrillation  Allergies  Allergen Reactions  . Penicillins Anaphylaxis    Childhood reaction Has patient had a PCN reaction causing immediate rash, facial/tongue/throat swelling, SOB or lightheadedness with hypotension: Unknown Has patient had a PCN reaction causing severe rash involving mucus membranes or skin necrosis: Unknown Has patient had a PCN reaction that required hospitalization: Unknown Has patient had a PCN reaction occurring within the last 10 years: No If all of the above answers are "NO", then may proceed with Cephalosporin use.   . Adalimumab     Patient Measurements: Height: 6' (182.9 cm) Weight: (!) 350 lb (158.8 kg) IBW/kg (Calculated) : 77.6 Heparin Dosing Weight: 115.5 kg  Vital Signs: Temp: 98.2 F (36.8 C) (10/10 2028) Temp Source: Oral (10/10 2028) BP: 114/63 (10/10 2028) Pulse Rate: 83 (10/10 2028)  Labs:  Recent Labs  04/29/17 0917 04/29/17 1016 04/29/17 1342 04/29/17 1650 04/29/17 2006 04/30/17 0159  HGB 15.9  --   --   --   --  14.7  HCT 45.9  --   --   --   --  42.1  PLT 272  --   --   --   --  226  APTT  --  28  --   --   --   --   LABPROT  --  13.0  --   --   --   --   INR  --  0.99  --   --   --   --   HEPARINUNFRC  --   --   --  0.24*  --  0.33  CREATININE 1.06  --   --   --   --   --   TROPONINI 0.03*  --  0.03*  --  0.04*  --     Estimated Creatinine Clearance: 128.4 mL/min (by C-G formula based on SCr of 1.06 mg/dL).   Medical History: Past Medical History:  Diagnosis Date  . Arthritis   . GERD (gastroesophageal reflux disease)   . Hypertension   . Morbid obesity ([place])   . PAF (paroxysmal atrial fibrillation) ([place])    a. Seen in 01/2010; b. CHA2DS2VASc = 1.  . Rheumatoid arthritis ([place])   . Sleep apnea     Medications:  Scheduled:  . aspirin  81 mg Oral Daily  . hydroxychloroquine  200 mg Oral Daily  . sodium  chloride flush  3 mL Intravenous Q12H   Infusions:  . diltiazem (CARDIZEM) infusion 5 mg/hr (04/29/17 1309)  . heparin 1,950 Units/hr (04/29/17 1947)    Assessment: 51 yo M to start Heparin drip for Afib.  Per Med Rec patient is not on anticoagulants at home. Hgb 15.9  Plt 272  INR 0.99   APTT 28  Goal of Therapy:  Heparin level 0.3-0.7 units/ml Monitor platelets by anticoagulation protocol: Yes   Plan:  Give 4000 units bolus x 1 Start heparin infusion at 1750 units/hr Check anti-Xa level in 6 hours and daily while on heparin Continue to monitor H&H and platelets   10/10:  HL @ 16:50 = 0.24 Will order Heparin 1700 units IV bolus X 1 and increase drip rate to 1950 units/hr. Will recheck HL 6 hrs after rate change.   10/11 @ 0200 HL 0.33 therapeutic. Will continue current rate and will recheck HL @ 0800  Tobie Lords, PharmD, BCPS Clinical Pharmacist 04/30/2017

## 2017-04-30 NOTE — Progress Notes (Signed)
Progress Note  Patient Name: Jeremy West Date of Encounter: 04/30/2017  Primary Cardiologist: Andree Coss, MD   Subjective   Feels ok.  No chest pain.  Remains in Afib - rates 90 to 100.  R hand swollen after failed IV attempt yesterday.  He thinks might also be RA flare.    Inpatient Medications    Scheduled Meds: . aspirin  81 mg Oral Daily  . hydroxychloroquine  200 mg Oral Daily  . sodium chloride flush  3 mL Intravenous Q12H   Continuous Infusions: . diltiazem (CARDIZEM) infusion 5 mg/hr (04/30/17 0320)  . heparin 1,950 Units/hr (04/29/17 1947)   PRN Meds: acetaminophen **OR** acetaminophen, albuterol, naproxen, ondansetron **OR** ondansetron (ZOFRAN) IV, polyethylene glycol   Vital Signs    Vitals:   04/29/17 1635 04/29/17 1858 04/29/17 2028 04/30/17 0527  BP: (!) 96/57 119/67 114/63 (!) 124/93  Pulse: 88 89 83 (!) 36  Resp:   18 16  Temp:   98.2 F (36.8 C) 97.9 F (36.6 C)  TempSrc:   Oral Oral  SpO2: 94%  96% 95%  Weight:    (!) 341 lb 14.4 oz (155.1 kg)  Height:        Intake/Output Summary (Last 24 hours) at 04/30/17 0815 Last data filed at 04/30/17 0532  Gross per 24 hour  Intake           606.52 ml  Output             2100 ml  Net         -1493.48 ml   Filed Weights   04/29/17 0913 04/30/17 0527  Weight: (!) 350 lb (158.8 kg) (!) 341 lb 14.4 oz (155.1 kg)    Physical Exam   GEN: Obese, in no acute distress.  HEENT: Grossly normal.  Neck: Supple, obese, difficult to gauge JVP, no carotid bruits, or masses. Cardiac: IR, IR, distant, no murmurs, rubs, or gallops. No clubbing, cyanosis, edema.  Radials/DP/PT 2+ and equal bilaterally.  Respiratory:  Respirations regular and unlabored, clear to auscultation bilaterally. GI: Obese, soft, nontender, nondistended, BS + x 4. MS: no deformity or atrophy. Skin: warm and dry, no rash. Neuro:  Strength and sensation are intact. Psych: AAOx3.  Normal affect.  Labs    Chemistry Recent Labs Lab  04/29/17 0917  NA 138  K 4.0  CL 105  CO2 24  GLUCOSE 155*  BUN 12  CREATININE 1.06  CALCIUM 9.6  GFRNONAA >60  GFRAA >60  ANIONGAP 9     Hematology Recent Labs Lab 04/29/17 0917 04/30/17 0159  WBC 8.2 7.5  RBC 5.93* 5.43  HGB 15.9 14.7  HCT 45.9 42.1  MCV 77.4* 77.5*  MCH 26.8 27.1  MCHC 34.7 35.0  RDW 14.7* 14.9*  PLT 272 226    Cardiac Enzymes Recent Labs Lab 04/29/17 0917 04/29/17 1342 04/29/17 2006  TROPONINI 0.03* 0.03* 0.04*      Radiology    Dg Chest Port 1 View  Result Date: 04/29/2017 CLINICAL DATA:  Chest pain EXAM: PORTABLE CHEST 1 VIEW COMPARISON:  11/15/2013 chest radiograph. FINDINGS: Low lung volumes. Stable cardiomediastinal silhouette with top-normal heart size. No pneumothorax. No pleural effusion. Lungs appear clear, with no acute consolidative airspace disease and no pulmonary edema. IMPRESSION: Hypoinspiratory chest radiograph with no active cardiopulmonary disease. Electronically Signed   By: Ilona Sorrel M.D.   On: 04/29/2017 09:37    Telemetry    Afib - 90-100 - Personally Reviewed  Cardiac Studies  2D Echocardiogram 10.10.2018  Study Conclusions   - Left ventricle: The cavity size was normal. There was moderate   concentric hypertrophy. Systolic function was normal. The   estimated ejection fraction was in the range of 55% to 60%.   Images were inadequate for LV wall motion assessment. The study   was not technically sufficient to allow evaluation of LV   diastolic dysfunction due to atrial fibrillation. - Aorta: Aortic root dimension: 44 mm (ED). - Aortic root: The aortic root was moderately dilated. - Left atrium: The atrium was mildly dilated.   Patient Profile     51 y.o. male with a history of PAF, HTN, obesity, OSA (not using CPAP), and rheumatoid arthritis admitted 10/10 secondary to c/p in the setting of rapid afib.  Assessment & Plan    1.  Afib RVR: Pt with a single episode of AF in 2011 - broke in ED @  that time, presented 10/10 after awakening with chest pain and palpitations reminiscent of prior AF episode.  Found to be in rapid afib and referred to ED.  Rate controlled on dilt 5mg /hr but hasn't converted yet.  Feeling better, no c/p.  Unfortunately, we are not able to cardiovert this AM due to lack of anesthesia availability.  Will add oral  blocker today.  If he doesn't convert today, will likely place on eliquis and arrange for outpt DCCV in three wks, provided that he remains rate controlled and is stable symptomatically. Cont heparin for now.  CHA2DS2VASc = 1.    2.  Elevated Troponin:  In setting of above.  Mild elevation with flat trend (0.03  0.03  0.04).  Echo showed nl LV fxn.  Will plan on ischemic eval as outpt.  ? 2 day myoview vs Cardiac CTA.  3.  OSA: noncompliant with CPAP - says machine broke.  Discussed importance of OSA mgmt in limiting exposure to Afib.  Will need repeat outpt sleep study (has been several years).  4.  Obesity:  Needs to lose wt.  He understands this. Activity is limited @ home.  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  Discussed importance of calorie restriction and better food choices along with increasing activity.  5.  RA: on hydroxychlorquine.  Right hand is swollen after failed IV attempt by EMS yesterday.  He says it feels like RA flare.  Warm compress and IV toradol ordered.  On prn naproxen as well.  6.  Dilated Ao Root:  44 mm by echo.  Will need annual f/u. BP stable.  Signed, Murray Hodgkins, NP  04/30/2017, 8:15 AM    For questions or updates, please contact   Please consult [email or web address] for contact info under Cardiology/STEMI.

## 2017-04-30 NOTE — Progress Notes (Deleted)
ANTICOAGULATION CONSULT NOTE - Initial Consult  Pharmacy Consult for Heparin Drip Indication: atrial fibrillation  Allergies  Allergen Reactions  . Penicillins Anaphylaxis    Childhood reaction Has patient had a PCN reaction causing immediate rash, facial/tongue/throat swelling, SOB or lightheadedness with hypotension: Unknown Has patient had a PCN reaction causing severe rash involving mucus membranes or skin necrosis: Unknown Has patient had a PCN reaction that required hospitalization: Unknown Has patient had a PCN reaction occurring within the last 10 years: No If all of the above answers are "NO", then may proceed with Cephalosporin use.   . Adalimumab     Patient Measurements: Height: 6' (182.9 cm) Weight: (!) 341 lb 14.4 oz (155.1 kg) IBW/kg (Calculated) : 77.6 Heparin Dosing Weight: 115.5 kg  Vital Signs: Temp: 98 F (36.7 C) (10/11 1200) Temp Source: Oral (10/11 1200) BP: 119/70 (10/11 1200) Pulse Rate: 74 (10/11 1200)  Labs:  Recent Labs  04/29/17 0917 04/29/17 1016 04/29/17 1342  04/29/17 2006 04/30/17 0159 04/30/17 0802 04/30/17 1655  HGB 15.9  --   --   --   --  14.7  --   --   HCT 45.9  --   --   --   --  42.1  --   --   PLT 272  --   --   --   --  226  --   --   APTT  --  28  --   --   --   --   --   --   LABPROT  --  13.0  --   --   --   --   --   --   INR  --  0.99  --   --   --   --   --   --   HEPARINUNFRC  --   --   --   < >  --  0.33 0.18* 0.67  CREATININE 1.06  --   --   --   --   --   --   --   TROPONINI 0.03*  --  0.03*  --  0.04*  --   --   --   < > = values in this interval not displayed.  Estimated Creatinine Clearance: 126.6 mL/min (by C-G formula based on SCr of 1.06 mg/dL).   Medical History: Past Medical History:  Diagnosis Date  . Arthritis   . Dilated aortic root ([place])    a. 04/2017 Echo: Mod dil Ao root @ 44 mm.  Marland Kitchen GERD (gastroesophageal reflux disease)   . History of echocardiogram    a. 04/2017 Echo: EF 55-60%, Mod  dil Ao root @ 44 mm.  mildly dil LA.  Marland Kitchen Hypertension   . Morbid obesity ([place])   . PAF (paroxysmal atrial fibrillation) ([place])    a. Seen in 01/2010; b. CHA2DS2VASc = 1.  . Rheumatoid arthritis ([place])   . Sleep apnea     Medications:  Scheduled:  . aspirin  81 mg Oral Daily  . hydroxychloroquine  200 mg Oral Daily  . metoprolol tartrate  25 mg Oral Q6H  . sodium chloride flush  3 mL Intravenous Q12H   Infusions:  . diltiazem (CARDIZEM) infusion 5 mg/hr (04/30/17 0320)  . heparin 2,300 Units/hr (04/30/17 1032)    Assessment: 51 yo M to start Heparin drip for Afib.  Per Med Rec patient is not on anticoagulants at home. Hgb 15.9  Plt 272  INR 0.99  APTT 28  Goal of Therapy:  Heparin level 0.3-0.7 units/ml Monitor platelets by anticoagulation protocol: Yes   Plan:  Give 4000 units bolus x 1 Start heparin infusion at 1750 units/hr Check anti-Xa level in 6 hours and daily while on heparin Continue to monitor H&H and platelets   10/10:  HL @ 16:50 = 0.24 Will order Heparin 1700 units IV bolus X 1 and increase drip rate to 1950 units/hr. Will recheck HL 6 hrs after rate change.   10/11 @ 0200 HL 0.33 therapeutic. Will continue current rate and will recheck HL @ 0800 10/11 :  HL @ 16:55 = 0.67.  Will continue this pt on current rate and recheck HL on 10/11 @ 23:00.   Ladera D Clinical Pharmacist 04/30/2017

## 2017-04-30 NOTE — Progress Notes (Signed)
[facility] - [place] at [facility]   PATIENT NAME: Jeremy West    MR#:  [id number]  DATE OF BIRTH:  February 13, 1966  SUBJECTIVE:  CHIEF COMPLAINT:  Patient is very frustrated as he needs another sleep study to get new CPAP machine as his previous one was broken. Denies any palpitations or chest pain  REVIEW OF SYSTEMS:  CONSTITUTIONAL: No fever, fatigue or weakness.  EYES: No blurred or double vision.  EARS, NOSE, AND THROAT: No tinnitus or ear pain.  RESPIRATORY: No cough, shortness of breath, wheezing or hemoptysis.  CARDIOVASCULAR: No chest pain, orthopnea, edema.  GASTROINTESTINAL: No nausea, vomiting, diarrhea or abdominal pain.  GENITOURINARY: No dysuria, hematuria.  ENDOCRINE: No polyuria, nocturia,  HEMATOLOGY: No anemia, easy bruising or bleeding SKIN: No rash or lesion. MUSCULOSKELETAL: No joint pain or arthritis.   NEUROLOGIC: No tingling, numbness, weakness.  PSYCHIATRY: No anxiety or depression.   DRUG ALLERGIES:   Allergies  Allergen Reactions  . Penicillins Anaphylaxis    Childhood reaction Has patient had a PCN reaction causing immediate rash, facial/tongue/throat swelling, SOB or lightheadedness with hypotension: Unknown Has patient had a PCN reaction causing severe rash involving mucus membranes or skin necrosis: Unknown Has patient had a PCN reaction that required hospitalization: Unknown Has patient had a PCN reaction occurring within the last 10 years: No If all of the above answers are "NO", then may proceed with Cephalosporin use.   . Adalimumab     VITALS:  Blood pressure 119/70, pulse 74, temperature 98 F (36.7 C), temperature source Oral, resp. rate 18, height 6' (1.829 m), weight (!) 155.1 kg (341 lb 14.4 oz), SpO2 97 %.  PHYSICAL EXAMINATION:  GENERAL:  51 y.o.-year-old patient lying in the bed with no acute distress.  EYES: Pupils equal, round, reactive to light and accommodation. No scleral icterus. Extraocular  muscles intact.  HEENT: Head atraumatic, normocephalic. Oropharynx and nasopharynx clear.  NECK:  Supple, no jugular venous distention. No thyroid enlargement, no tenderness.  LUNGS: Normal breath sounds bilaterally, no wheezing, rales,rhonchi or crepitation. No use of accessory muscles of respiration.  CARDIOVASCULAR: Irregularly irregular. No murmurs, rubs, or gallops.  ABDOMEN: Soft, nontender, nondistended. Bowel sounds present. No organomegaly or mass.  EXTREMITIES: No pedal edema, cyanosis, or clubbing.  NEUROLOGIC: Cranial nerves II through XII are intact. Muscle strength 5/5 in all extremities. Sensation intact. Gait not checked.  PSYCHIATRIC: The patient is alert and oriented x 3.  SKIN: No obvious rash, lesion, or ulcer.    LABORATORY PANEL:   CBC  Recent Labs Lab 04/30/17 0159  WBC 7.5  HGB 14.7  HCT 42.1  PLT 226   ------------------------------------------------------------------------------------------------------------------  Chemistries   Recent Labs Lab 04/29/17 0917 04/29/17 1210  NA 138  --   K 4.0  --   CL 105  --   CO2 24  --   GLUCOSE 155*  --   BUN 12  --   CREATININE 1.06  --   CALCIUM 9.6  --   MG  --  2.1   ------------------------------------------------------------------------------------------------------------------  Cardiac Enzymes  Recent Labs Lab 04/29/17 2006  TROPONINI 0.04*   ------------------------------------------------------------------------------------------------------------------  RADIOLOGY:  Dg Chest Port 1 View  Result Date: 04/29/2017 CLINICAL DATA:  Chest pain EXAM: PORTABLE CHEST 1 VIEW COMPARISON:  11/15/2013 chest radiograph. FINDINGS: Low lung volumes. Stable cardiomediastinal silhouette with top-normal heart size. No pneumothorax. No pleural effusion. Lungs appear clear, with no acute consolidative airspace disease and no pulmonary edema. IMPRESSION: Hypoinspiratory chest radiograph with  no active  cardiopulmonary disease. Electronically Signed   By: Delbert Phenix M.D.   On: 04/29/2017 09:37    EKG:   Orders placed or performed during the hospital encounter of 04/29/17  . EKG 12-Lead  . EKG 12-Lead  . ED EKG  . ED EKG    ASSESSMENT AND PLAN:    *  Afib with RVR Heparin drip for anticoagulation and Cardizem drip If patient is not converting to sinus he will be started on eliquis and arrange for outpatient cardioversion in 3 weeks Appreciate [facility] cardiology recommendations Check Echo. epeat troponin If doesn't return to NSR cardioversion per cardiology as OP  *Elevated troponin secondary to A. fib with RVR Nontender in troponins Echocardiogram with normal ejection fraction Patient might be benefited with outpatient Myoview test  * HTN Started on lopressor Will d/c HCTZ  * OSA Patient's CPAP machine broken, very upset and disappointed as he needs another sleep study to get a new CPAP machine  * Rheumatoid arthritis  * DVT prophylaxis On heparin    All the records are reviewed and case discussed with Care Management/Social Workerr. Management plans discussed with the patient, family and they are in agreement.  CODE STATUS: fc   TOTAL TIME TAKING CARE OF THIS PATIENT: 36  minutes.   POSSIBLE D/C IN 1-2  DAYS, DEPENDING ON CLINICAL CONDITION.  Note: This dictation was prepared with Dragon dictation along with smaller phrase technology. Any transcriptional errors that result from this process are unintentional.   Ramonita Lab M.D on 04/30/2017 at 3:21 PM  Between 7am to 6pm - Pager - [phone] After 6pm go to [email or web address] - password [email or web address] [facility]  [facility]  Office  [phone]  CC: Primary care physician; Margaretann Loveless, PA-C

## 2017-04-30 NOTE — Progress Notes (Signed)
ANTICOAGULATION CONSULT NOTE - Initial Consult  Pharmacy Consult for Heparin Drip Indication: atrial fibrillation  Allergies  Allergen Reactions  . Penicillins Anaphylaxis    Childhood reaction Has patient had a PCN reaction causing immediate rash, facial/tongue/throat swelling, SOB or lightheadedness with hypotension: Unknown Has patient had a PCN reaction causing severe rash involving mucus membranes or skin necrosis: Unknown Has patient had a PCN reaction that required hospitalization: Unknown Has patient had a PCN reaction occurring within the last 10 years: No If all of the above answers are "NO", then may proceed with Cephalosporin use.   . Adalimumab     Patient Measurements: Height: 6' (182.9 cm) Weight: (!) 341 lb 14.4 oz (155.1 kg) IBW/kg (Calculated) : 77.6 Heparin Dosing Weight: 115.5 kg  Vital Signs: Temp: 98 F (36.7 C) (10/11 1200) Temp Source: Oral (10/11 1200) BP: 119/70 (10/11 1200) Pulse Rate: 74 (10/11 1200)  Labs:  Recent Labs  04/29/17 0917 04/29/17 1016 04/29/17 1342  04/29/17 2006 04/30/17 0159 04/30/17 0802 04/30/17 1655  HGB 15.9  --   --   --   --  14.7  --   --   HCT 45.9  --   --   --   --  42.1  --   --   PLT 272  --   --   --   --  226  --   --   APTT  --  28  --   --   --   --   --   --   LABPROT  --  13.0  --   --   --   --   --   --   INR  --  0.99  --   --   --   --   --   --   HEPARINUNFRC  --   --   --   < >  --  0.33 0.18* 0.67  CREATININE 1.06  --   --   --   --   --   --   --   TROPONINI 0.03*  --  0.03*  --  0.04*  --   --   --   < > = values in this interval not displayed.  Estimated Creatinine Clearance: 126.6 mL/min (by C-G formula based on SCr of 1.06 mg/dL).   Medical History: Past Medical History:  Diagnosis Date  . Arthritis   . Dilated aortic root ([place])    a. 04/2017 Echo: Mod dil Ao root @ 44 mm.  Marland Kitchen GERD (gastroesophageal reflux disease)   . History of echocardiogram    a. 04/2017 Echo: EF 55-60%, Mod  dil Ao root @ 44 mm.  mildly dil LA.  Marland Kitchen Hypertension   . Morbid obesity ([place])   . PAF (paroxysmal atrial fibrillation) ([place])    a. Seen in 01/2010; b. CHA2DS2VASc = 1.  . Rheumatoid arthritis ([place])   . Sleep apnea     Medications:  Scheduled:  . aspirin  81 mg Oral Daily  . hydroxychloroquine  200 mg Oral Daily  . metoprolol tartrate  25 mg Oral Q6H  . sodium chloride flush  3 mL Intravenous Q12H   Infusions:  . diltiazem (CARDIZEM) infusion 5 mg/hr (04/30/17 0320)  . heparin 2,300 Units/hr (04/30/17 1032)    Assessment: 51 yo M to start Heparin drip for Afib.  Per Med Rec patient is not on anticoagulants at home. Hgb 15.9  Plt 272  INR 0.99  APTT 28  Goal of Therapy:  Heparin level 0.3-0.7 units/ml Monitor platelets by anticoagulation protocol: Yes   Plan:  Give 4000 units bolus x 1 Start heparin infusion at 1750 units/hr Check anti-Xa level in 6 hours and daily while on heparin Continue to monitor H&H and platelets   10/10:  HL @ 16:50 = 0.24 Will order Heparin 1700 units IV bolus X 1 and increase drip rate to 1950 units/hr. Will recheck HL 6 hrs after rate change.   10/11 @ 0200 HL 0.33 therapeutic. Will continue current rate and will recheck HL @ 0800  10/11 @ 0802 HL 0.18 subtherapeutic. Will order Heparin 3000 units IV bolus x 1 and increase drip rate to 2300 units/hr.  Will recheck HL 6 hours after rate change.   10/11 HL @ 16:55 = 0.67 Will continue this pt on current rate and recheck HL on 10/11 @ 23:00.    Jeremy West D 04/30/2017

## 2017-04-30 NOTE — Progress Notes (Addendum)
ANTICOAGULATION CONSULT NOTE - Initial Consult  Pharmacy Consult for Heparin Drip Indication: atrial fibrillation  Allergies  Allergen Reactions  . Penicillins Anaphylaxis    Childhood reaction Has patient had a PCN reaction causing immediate rash, facial/tongue/throat swelling, SOB or lightheadedness with hypotension: Unknown Has patient had a PCN reaction causing severe rash involving mucus membranes or skin necrosis: Unknown Has patient had a PCN reaction that required hospitalization: Unknown Has patient had a PCN reaction occurring within the last 10 years: No If all of the above answers are "NO", then may proceed with Cephalosporin use.   . Adalimumab     Patient Measurements: Height: 6' (182.9 cm) Weight: (!) 341 lb 14.4 oz (155.1 kg) IBW/kg (Calculated) : 77.6 Heparin Dosing Weight: 115.5 kg  Vital Signs: Temp: 97.8 F (36.6 C) (10/11 0819) Temp Source: Oral (10/11 0819) BP: 108/74 (10/11 0819) Pulse Rate: 85 (10/11 0819)  Labs:  Recent Labs  04/29/17 0917 04/29/17 1016 04/29/17 1342 04/29/17 1650 04/29/17 2006 04/30/17 0159 04/30/17 0802  HGB 15.9  --   --   --   --  14.7  --   HCT 45.9  --   --   --   --  42.1  --   PLT 272  --   --   --   --  226  --   APTT  --  28  --   --   --   --   --   LABPROT  --  13.0  --   --   --   --   --   INR  --  0.99  --   --   --   --   --   HEPARINUNFRC  --   --   --  0.24*  --  0.33 0.18*  CREATININE 1.06  --   --   --   --   --   --   TROPONINI 0.03*  --  0.03*  --  0.04*  --   --     Estimated Creatinine Clearance: 126.6 mL/min (by C-G formula based on SCr of 1.06 mg/dL).   Medical History: Past Medical History:  Diagnosis Date  . Arthritis   . GERD (gastroesophageal reflux disease)   . Hypertension   . Morbid obesity ([place])   . PAF (paroxysmal atrial fibrillation) ([place])    a. Seen in 01/2010; b. CHA2DS2VASc = 1.  . Rheumatoid arthritis ([place])   . Sleep apnea     Medications:  Scheduled:  . aspirin  81  mg Oral Daily  . heparin  3,000 Units Intravenous Once  . hydroxychloroquine  200 mg Oral Daily  . metoprolol tartrate  25 mg Oral Q6H  . sodium chloride flush  3 mL Intravenous Q12H   Infusions:  . diltiazem (CARDIZEM) infusion 5 mg/hr (04/30/17 0320)  . heparin 1,950 Units/hr (04/29/17 1947)    Assessment: 51 yo M to start Heparin drip for Afib.  Per Med Rec patient is not on anticoagulants at home. Hgb 15.9  Plt 272  INR 0.99   APTT 28  Goal of Therapy:  Heparin level 0.3-0.7 units/ml Monitor platelets by anticoagulation protocol: Yes   Plan:  Give 4000 units bolus x 1 Start heparin infusion at 1750 units/hr Check anti-Xa level in 6 hours and daily while on heparin Continue to monitor H&H and platelets   10/10:  HL @ 16:50 = 0.24 Will order Heparin 1700 units IV bolus X 1 and increase drip  rate to 1950 units/hr. Will recheck HL 6 hrs after rate change.   10/11 @ 0200 HL 0.33 therapeutic. Will continue current rate and will recheck HL @ 0800  10/11 @ 0802 HL 0.18 subtherapeutic. Will order Heparin 3000 units IV bolus x 1 and increase drip rate to 2300 units/hr.  Will recheck HL 6 hours after rate change.   [place] Resident  04/30/2017

## 2017-04-30 NOTE — Care Management (Signed)
Patient on cardizem drip and adding oral beta blocker. Unable to proceed with cardioversion today due to lack of anesthesia availability. Spoke with patient regarding his cpap machine.  It has been broken at least 6 years and last sleep study was about 13 years ago.  Discussed the need to have another sleep study and he verbalizes understanding. He says he is going to reach out to the [place] for assistance.  At present, cardiology is not planning  on oral anticoagulation

## 2017-05-01 DIAGNOSIS — R079 Chest pain, unspecified: Secondary | ICD-10-CM

## 2017-05-01 DIAGNOSIS — M069 Rheumatoid arthritis, unspecified: Secondary | ICD-10-CM

## 2017-05-01 DIAGNOSIS — I4891 Unspecified atrial fibrillation: Secondary | ICD-10-CM

## 2017-05-01 DIAGNOSIS — G4733 Obstructive sleep apnea (adult) (pediatric): Secondary | ICD-10-CM

## 2017-05-01 LAB — HEPARIN LEVEL (UNFRACTIONATED): HEPARIN UNFRACTIONATED: 0.58 [IU]/mL (ref 0.30–0.70)

## 2017-05-01 MED ORDER — ACETAMINOPHEN 325 MG PO TABS: 650.0000 mg | ORAL_TABLET | Freq: Four times a day (QID) | ORAL | Status: DC | PRN

## 2017-05-01 MED ORDER — METOPROLOL TARTRATE 50 MG PO TABS: 50.0000 mg | ORAL_TABLET | Freq: Two times a day (BID) | ORAL | 0 refills | Status: DC

## 2017-05-01 MED ORDER — DILTIAZEM HCL ER COATED BEADS 120 MG PO CP24
120.0000 mg | ORAL_CAPSULE | Freq: Every day | ORAL | Status: DC
  Administered 2017-05-01: 120 mg via ORAL
  Filled 2017-05-01: qty 1

## 2017-05-01 MED ORDER — DILTIAZEM HCL ER COATED BEADS 120 MG PO CP24: 120.0000 mg | ORAL_CAPSULE | Freq: Every day | ORAL | 0 refills | Status: DC

## 2017-05-01 MED ORDER — APIXABAN 5 MG PO TABS
5.0000 mg | ORAL_TABLET | Freq: Two times a day (BID) | ORAL | Status: DC
  Administered 2017-05-01: 5 mg via ORAL
  Filled 2017-05-01: qty 1

## 2017-05-01 MED ORDER — METOPROLOL TARTRATE 50 MG PO TABS
50.0000 mg | ORAL_TABLET | Freq: Two times a day (BID) | ORAL | Status: DC
  Administered 2017-05-01: 50 mg via ORAL
  Filled 2017-05-01: qty 1

## 2017-05-01 MED ORDER — APIXABAN 5 MG PO TABS
5.0000 mg | ORAL_TABLET | Freq: Two times a day (BID) | ORAL | 0 refills | Status: DC
Start: 2017-05-01 — End: 2017-05-22

## 2017-05-01 NOTE — Progress Notes (Signed)
ANTICOAGULATION CONSULT NOTE - Initial Consult  Pharmacy Consult for Heparin Drip Indication: atrial fibrillation  Allergies  Allergen Reactions  . Penicillins Anaphylaxis    Childhood reaction Has patient had a PCN reaction causing immediate rash, facial/tongue/throat swelling, SOB or lightheadedness with hypotension: Unknown Has patient had a PCN reaction causing severe rash involving mucus membranes or skin necrosis: Unknown Has patient had a PCN reaction that required hospitalization: Unknown Has patient had a PCN reaction occurring within the last 10 years: No If all of the above answers are "NO", then may proceed with Cephalosporin use.   . Adalimumab     Patient Measurements: Height: 6' (182.9 cm) Weight: (!) 342 lb 11.2 oz (155.4 kg) IBW/kg (Calculated) : 77.6 Heparin Dosing Weight: 115.5 kg  Vital Signs: Temp: 97.5 F (36.4 C) (10/12 0506) Temp Source: Oral (10/12 0506) BP: 113/76 (10/12 0506) Pulse Rate: 33 (10/12 0506)  Labs:  Recent Labs  04/29/17 0917 04/29/17 1016 04/29/17 1342  04/29/17 2006 04/30/17 0159  04/30/17 1655 04/30/17 2309 05/01/17 0531  HGB 15.9  --   --   --   --  14.7  --   --   --   --   HCT 45.9  --   --   --   --  42.1  --   --   --   --   PLT 272  --   --   --   --  226  --   --   --   --   APTT  --  28  --   --   --   --   --   --   --   --   LABPROT  --  13.0  --   --   --   --   --   --   --   --   INR  --  0.99  --   --   --   --   --   --   --   --   HEPARINUNFRC  --   --   --   < >  --  0.33  < > 0.67 0.38 0.58  CREATININE 1.06  --   --   --   --   --   --   --   --   --   TROPONINI 0.03*  --  0.03*  --  0.04*  --   --   --   --   --   < > = values in this interval not displayed.  Estimated Creatinine Clearance: 126.8 mL/min (by C-G formula based on SCr of 1.06 mg/dL).   Medical History: Past Medical History:  Diagnosis Date  . Arthritis   . Dilated aortic root ([place])    a. 04/2017 Echo: Mod dil Ao root @ 44 mm.  Marland Kitchen  GERD (gastroesophageal reflux disease)   . History of echocardiogram    a. 04/2017 Echo: EF 55-60%, Mod dil Ao root @ 44 mm.  mildly dil LA.  Marland Kitchen Hypertension   . Morbid obesity ([place])   . PAF (paroxysmal atrial fibrillation) ([place])    a. Seen in 01/2010; b. CHA2DS2VASc = 1.  . Rheumatoid arthritis ([place])   . Sleep apnea     Medications:  Scheduled:  . aspirin  81 mg Oral Daily  . hydroxychloroquine  200 mg Oral Daily  . metoprolol tartrate  25 mg Oral Q6H  . sodium chloride flush  3 mL  Intravenous Q12H   Infusions:  . diltiazem (CARDIZEM) infusion 5 mg/hr (04/30/17 0320)  . heparin 2,300 Units/hr (04/30/17 1032)    Assessment: 51 yo M to start Heparin drip for Afib.  Per Med Rec patient is not on anticoagulants at home. Hgb 15.9  Plt 272  INR 0.99   APTT 28  Goal of Therapy:  Heparin level 0.3-0.7 units/ml Monitor platelets by anticoagulation protocol: Yes   Plan:  Give 4000 units bolus x 1 Start heparin infusion at 1750 units/hr Check anti-Xa level in 6 hours and daily while on heparin Continue to monitor H&H and platelets   10/10:  HL @ 16:50 = 0.24 Will order Heparin 1700 units IV bolus X 1 and increase drip rate to 1950 units/hr. Will recheck HL 6 hrs after rate change.   10/11 @ 0200 HL 0.33 therapeutic. Will continue current rate and will recheck HL @ 0800  10/11 @ 0802 HL 0.18 subtherapeutic. Will order Heparin 3000 units IV bolus x 1 and increase drip rate to 2300 units/hr.  Will recheck HL 6 hours after rate change.   10/11 HL @ 16:55 = 0.67 Will continue this pt on current rate and recheck HL on 10/11 @ 23:00.   10/13 @ 0531 HL 0.58 therapeutic. Will continue current rate and will recheck HL/CBC w/ am labs.  Tobie Lords, PharmD, BCPS Clinical Pharmacist 05/01/2017

## 2017-05-01 NOTE — Progress Notes (Signed)
Progress Note  Patient Name: Jeremy West Date of Encounter: 05/01/2017  Primary Cardiologist: Andree Coss, MD   Subjective   No chest pain or shortness of breath. Eager to go home. He remains in rate controlled atrial fibrillation and is tolerating therapy well.  Inpatient Medications    Scheduled Meds: . apixaban  5 mg Oral BID  . aspirin  81 mg Oral Daily  . diltiazem  120 mg Oral Daily  . hydroxychloroquine  200 mg Oral Daily  . metoprolol tartrate  50 mg Oral BID  . sodium chloride flush  3 mL Intravenous Q12H   Continuous Infusions:  PRN Meds: acetaminophen **OR** acetaminophen, albuterol, naproxen, ondansetron **OR** ondansetron (ZOFRAN) IV, polyethylene glycol   Vital Signs    Vitals:   04/30/17 1200 04/30/17 1937 04/30/17 2309 05/01/17 0506  BP: 119/70 (!) 129/91  113/76  Pulse: 74 (!) 44 75 (!) 33  Resp: 18     Temp: 98 F (36.7 C) 97.9 F (36.6 C)  (!) 97.5 F (36.4 C)  TempSrc: Oral Oral  Oral  SpO2: 97% 98%  96%  Weight:    (!) 342 lb 11.2 oz (155.4 kg)  Height:        Intake/Output Summary (Last 24 hours) at 05/01/17 0912 Last data filed at 05/01/17 0816  Gross per 24 hour  Intake             1394 ml  Output             1450 ml  Net              -56 ml   Filed Weights   04/29/17 0913 04/30/17 0527 05/01/17 0506  Weight: (!) 350 lb (158.8 kg) (!) 341 lb 14.4 oz (155.1 kg) (!) 342 lb 11.2 oz (155.4 kg)    Physical Exam   GEN: Well nourished, well developed, in no acute distress.  HEENT: Grossly normal.  Neck: Supple, Difficult to gauge JVP secondary to girth, no carotid bruits, or masses. Cardiac: Irregularly irregular , no murmurs, rubs, or gallops. No clubbing, cyanosis, edema.  Radials/DP/PT 2+ and equal bilaterally.  Respiratory:  Respirations regular and unlabored, clear to auscultation bilaterally. GI: Soft, nontender, nondistended, BS + x 4. MS: no deformity or atrophy. Skin: warm and dry, no rash. Neuro:  Strength and sensation  are intact. Psych: AAOx3.  Normal affect.  Labs    Chemistry Recent Labs Lab 04/29/17 0917  NA 138  K 4.0  CL 105  CO2 24  GLUCOSE 155*  BUN 12  CREATININE 1.06  CALCIUM 9.6  GFRNONAA >60  GFRAA >60  ANIONGAP 9     Hematology Recent Labs Lab 04/29/17 0917 04/30/17 0159  WBC 8.2 7.5  RBC 5.93* 5.43  HGB 15.9 14.7  HCT 45.9 42.1  MCV 77.4* 77.5*  MCH 26.8 27.1  MCHC 34.7 35.0  RDW 14.7* 14.9*  PLT 272 226    Cardiac Enzymes Recent Labs Lab 04/29/17 0917 04/29/17 1342 04/29/17 2006  TROPONINI 0.03* 0.03* 0.04*     Radiology    Dg Chest Port 1 View  Result Date: 04/29/2017 CLINICAL DATA:  Chest pain EXAM: PORTABLE CHEST 1 VIEW COMPARISON:  11/15/2013 chest radiograph. FINDINGS: Low lung volumes. Stable cardiomediastinal silhouette with top-normal heart size. No pneumothorax. No pleural effusion. Lungs appear clear, with no acute consolidative airspace disease and no pulmonary edema. IMPRESSION: Hypoinspiratory chest radiograph with no active cardiopulmonary disease. Electronically Signed   By: Janina Mayo.D.  On: 04/29/2017 09:37    Telemetry    Rate controlled A. fib in the 70s and 80s - Personally Reviewed  Cardiac Studies   2D Echocardiogram 10.10.2018  Study Conclusions  - Left ventricle: The cavity size was normal. There was moderate concentric hypertrophy. Systolic function was normal. The estimated ejection fraction was in the range of 55% to 60%. Images were inadequate for LV wall motion assessment. The study was not technically sufficient to allow evaluation of LV diastolic dysfunction due to atrial fibrillation. - Aorta: Aortic root dimension: 44 mm (ED). - Aortic root: The aortic root was moderately dilated. - Left atrium: The atrium was mildly dilated.  Patient Profile     51 y.o.malewith a history of PAF, HTN, obesity, OSA (not using CPAP), and rheumatoid arthritis admitted 10/10 secondary to c/p in the setting of  rapid afib.  Assessment & Plan    1. A. fib with RVR: Patient with a single episode of atrial fibrillation in 2011-broke in emergency department at that time, presented October 10 after awakening with chest pain and palpitations reminiscent of prior A. fib episode. Rate has been controlled with IV diltiazem and oral beta blocker. He remains in atrial fibrillation. In that setting, as previous plan, we will transition him to oral diltiazem 120 mg daily, consolidate metoprolol to 50 mg twice a day, and discontinue heparin in favor of eliquis 5 mg twice a day. Ambulate this morning. If rate control and he feels well, plan on discharge with outpatient follow-up and cardioversion as an outpatient in 3 weeks.  2. Elevated troponin: In the setting of above. Mild elevation with flat trend. Echo showed normal LV function. We will discuss ischemic evaluation as an outpatient. Patient is reluctant to undergo additional outpatient testing secondary to finances.  3. Obstructive sleep apnea: CPAP machine broke some time ago. He knows he needs repeat sleep study and will need a new device afterwards.   4. Obesity: He has been counseled on the importance of calorie restriction and increasing activity.  5. Rheumatoid arthritis: On hydroxychloroquine. Right hand slightly better after receiving IV Toradol and warm compress yesterday. 6. Dilated aortic root: 44 mm by echocardiogram. Will need annual follow-up. Blood pressure stable. Continue beta blocker.  Signed  Murray Hodgkins, NP  05/01/2017, 9:12 AM    For questions or updates, please contact   Please consult [email or web address] for contact info under Cardiology/STEMI.   Attending Note Patient seen and examined, agree with detailed note above,  Patient presentation and plan discussed on rounds.   Long discussion with patient this morning concerning atrial fibrillation and sleep apnea. details as above Unable to afford repeat sleep study and CPAP given  out-of-pocket expenses Heart rate has dramatically improved rate now 70-80 at rest No further chest pain, denies any tachycardia Currently on heparin and diltiazem infusion Telemetry reviewed showing atrial fibrillation  On physical exam he is obese, unable to estimate JVD, lungs clear with no wheezes or rales, heart sounds irregular no murmurs appreciated, abdomen distended soft no significant lower extremity edema  No lab work performed today for review  A/P: 1) atrial fibrillation, persistent Exact timing unclear though likely 48 hours ago, although not definitive. Rate improved, will change diltiazem to oral dosing with metoprolol Change heparin to oral anticoagulation, eliquis 5 twice a day Coupon and samples left at the office for him Plan would be cardioversion in 3-4 weeks if he is willing  2) OSA,  CPAP previously broke, no recent sleep study, last was  more than 10 years ago He reports he is unable to afford sleep study and new CPAP at this time despite having insurance We will inquire about other options  3) rheumatoid arthritis Long history, on hydroxychloroquine Upset about previous financialcharges for procedures at [place] than 50% was spent in counseling and coordination of care with patient Total encounter time 35 minutes or more   Signed: Esmond Plants  M.D., Ph.D. [facility]

## 2017-05-01 NOTE — Discharge Instructions (Signed)
Information on my medicine - ELIQUIS (apixaban)  This medication education was reviewed with me or my healthcare representative as part of my discharge preparation.  The pharmacist that spoke with me during my hospital stay was:  Candelaria Stagers, [facility]  Why was Eliquis prescribed for you? Eliquis was prescribed for you to reduce the risk of forming blood clots that can cause a stroke if you have a medical condition called atrial fibrillation (a type of irregular heartbeat) OR to reduce the risk of a blood clots forming after orthopedic surgery.  What do You need to know about Eliquis ? Take your Eliquis TWICE DAILY - one tablet in the morning and one tablet in the evening with or without food.  It would be best to take the doses about the same time each day.  If you have difficulty swallowing the tablet whole please discuss with your pharmacist how to take the medication safely.  Take Eliquis exactly as prescribed by your doctor and DO NOT stop taking Eliquis without talking to the doctor who prescribed the medication.  Stopping may increase your risk of developing a new clot or stroke.  Refill your prescription before you run out.  After discharge, you should have regular check-up appointments with your healthcare provider that is prescribing your Eliquis.  In the future your dose may need to be changed if your kidney function or weight changes by a significant amount or as you get older.  What do you do if you miss a dose? If you miss a dose, take it as soon as you remember on the same day and resume taking twice daily.  Do not take more than one dose of ELIQUIS at the same time.  Important Safety Information A possible side effect of Eliquis is bleeding. You should call your healthcare provider right away if you experience any of the following: ? Bleeding from an injury or your nose that does not stop. ? Unusual colored urine (red or dark brown) or unusual colored stools (red or  black). ? Unusual bruising for unknown reasons. ? A serious fall or if you hit your head (even if there is no bleeding).  Some medicines may interact with Eliquis and might increase your risk of bleeding or clotting while on Eliquis. To help avoid this, consult your healthcare provider or pharmacist prior to using any new prescription or non-prescription medications, including herbals, vitamins, non-steroidal anti-inflammatory drugs (NSAIDs) and supplements.  This website has more information on Eliquis (apixaban): [email or web address].   Follow-up with primary care physician and cardiology in a week

## 2017-05-01 NOTE — Progress Notes (Signed)
Pt instructed on discharge instructions and medications and verbalized understanding, prescriptions given directly to pt, IVs discontinued, tips intact, telemetry monitor removed, pt left ambulating in stable condition with no s/s of distress or discomfort.

## 2017-05-01 NOTE — Progress Notes (Signed)
Medication Samples have been placed up front for pickup.  Drug name: Eliquis       Strength: 5 mg         Qty: 2 boxes  LOT: [id number]  Exp.Date: 1/21  Dosing instructions: Take 1 tablet twice a day  Samples with coupon cards placed up front for patient to pick up here in our office.

## 2017-05-01 NOTE — Care Management (Addendum)
Spoke with patient in the presence of his daughter regarding the need to pursue another sleep study for another cpap.  Patient is adamant "I can not afford another cpap. machine."  Discussed veterans administration options.  Provided Eliquis coupons and instructed patient and daughter to activate the co pay assistance card prior to leaving in the event there are any problems.  Patient did not qualify for nocturnal 02 based on overnight

## 2017-05-01 NOTE — Progress Notes (Signed)
Pt ambulated in hallway with nurse and pts heart rate maintained in the 80-90s. Pts heart rate peaked at 103 and came right back down to 92. Pt tolerated ambulation well with no s/s of distress or discomfort in chest. All safety measures in place, will continue to monitor.

## 2017-05-01 NOTE — Discharge Summary (Signed)
Jeremy West   PATIENT NAME: Jeremy West    MR#:  [id number]  DATE OF BIRTH:  28-Jun-1966  DATE OF ADMISSION:  04/29/2017 ADMITTING PHYSICIAN: Milagros Loll, MD  DATE OF DISCHARGE: 05/01/17  PRIMARY CARE PHYSICIAN: Margaretann Loveless, PA-C    ADMISSION DIAGNOSIS:  Atrial fibrillation with rapid ventricular response (HCC) [I48.91] Nonspecific chest pain [R07.9]  DISCHARGE DIAGNOSIS:  Active Problems:   A-fib (HCC)   SECONDARY DIAGNOSIS:   Past Medical History:  Diagnosis Date  . Arthritis   . Dilated aortic root (HCC)    a. 04/2017 Echo: Mod dil Ao root @ 44 mm.  Marland Kitchen GERD (gastroesophageal reflux disease)   . History of echocardiogram    a. 04/2017 Echo: EF 55-60%, Mod dil Ao root @ 44 mm.  mildly dil LA.  Marland Kitchen Hypertension   . Morbid obesity (HCC)   . PAF (paroxysmal atrial fibrillation) (HCC)    a. Seen in 01/2010; b. CHA2DS2VASc = 1.  . Rheumatoid arthritis (HCC)   . Sleep apnea     HOSPITAL COURSE:   hpi  [name]  is a 51 y.o. male with a known history of Afib, HTN, RA, sleep apnea here with chest pain at work. EMS was called and found to have Afib in 180s. Given 5 lopressor IV and was down to 180s. Another 5 mg given in ED, Still tachycardic. Chest pain is better. He is aware of an episode of Afib 8 yrs back but nothing after that. Was in the emergency room and converted to normal sinus rhythm and discharged home. Not on aspirin or any anticoagulants. No bleeding. Has sleep apnea but has not used his CPAP machine in a few years.  *  Afib with RVR Heparin drip for anticoagulation and Cardizem drip given .Cardiology has discontinued IV heparin drip and Cardizem drip Patient is started on by mouth diltiazem 120 mg by mouth daily, metoprolol 50 mg twice a day and started him on eliquis 5 mg twice a day, cardiology office will provide him samples of eliquis, coupon given Appreciate [facility] cardiology  recommendations Echocardiogram with 55-60% ejection fraction   *Elevated troponin secondary to A. fib with RVR Nontender in troponins Echocardiogram with normal ejection fraction Patient might be benefited with outpatient Myoview test  * HTN Started on lopressor Will d/c HCTZ  * OSA Patient's CPAP machine broken,   needs to get a new CPAP machine Reinforced the importance of being compliant with the machine and outpatient follow-up with the pulmonologist to get sleep study is recommended  Nocturnal pulse oximetry was done , patient did not meet criteria for home oxygen   *Obesity Lifestyle changes recommended  * Rheumatoid arthritis- op f/u   * DVT prophylaxis Onheparin  DISCHARGE CONDITIONS:   stable  CONSULTS OBTAINED:  Treatment Team:  Yvonne Kendall, MD   PROCEDURES  None  DRUG ALLERGIES:   Allergies  Allergen Reactions  . Penicillins Anaphylaxis    Childhood reaction Has patient had a PCN reaction causing immediate rash, facial/tongue/throat swelling, SOB or lightheadedness with hypotension: Unknown Has patient had a PCN reaction causing severe rash involving mucus membranes or skin necrosis: Unknown Has patient had a PCN reaction that required hospitalization: Unknown Has patient had a PCN reaction occurring within the last 10 years: No If all of the above answers are "NO", then may proceed with Cephalosporin use.   . Adalimumab     DISCHARGE MEDICATIONS:   Current Discharge Medication List  START taking these medications   Details  acetaminophen (TYLENOL) 325 MG tablet Take 2 tablets (650 mg total) by mouth every 6 (six) hours as needed for mild pain (or Fever >/= 101).    apixaban (ELIQUIS) 5 MG TABS tablet Take 1 tablet (5 mg total) by mouth 2 (two) times daily. Qty: 60 tablet, Refills: 0    diltiazem (CARDIZEM CD) 120 MG 24 hr capsule Take 1 capsule (120 mg total) by mouth daily. Qty: 30 capsule, Refills: 0    metoprolol tartrate  (LOPRESSOR) 50 MG tablet Take 1 tablet (50 mg total) by mouth 2 (two) times daily. Qty: 60 tablet, Refills: 0      CONTINUE these medications which have NOT CHANGED   Details  hydroxychloroquine (PLAQUENIL) 200 MG tablet Take 200 mg by mouth daily. Refills: 2      STOP taking these medications     losartan-hydrochlorothiazide (HYZAAR) 100-25 MG tablet      naproxen (NAPROSYN) 500 MG tablet          DISCHARGE INSTRUCTIONS:   Follow-up with primary care physician and cardiology in a week  DIET:  Cardiac diet  DISCHARGE CONDITION:  Stable  ACTIVITY:  Activity as tolerated  OXYGEN:  Home Oxygen: No.   Oxygen Delivery: room air  DISCHARGE LOCATION:  home   If you experience worsening of your admission symptoms, develop shortness of breath, life threatening emergency, suicidal or homicidal thoughts you must seek medical attention immediately by calling 911 or calling your MD immediately  if symptoms less severe.  You Must read complete instructions/literature along with all the possible adverse reactions/side effects for all the Medicines you take and that have been prescribed to you. Take any new Medicines after you have completely understood and accpet all the possible adverse reactions/side effects.   Please note  You were cared for by a hospitalist during your hospital stay. If you have any questions about your discharge medications or the care you received while you were in the hospital after you are discharged, you can call the unit and asked to speak with the hospitalist on call if the hospitalist that took care of you is not available. Once you are discharged, your primary care physician will handle any further medical issues. Please note that NO REFILLS for any discharge medications will be authorized once you are discharged, as it is imperative that you return to your primary care physician (or establish a relationship with a primary care physician if you do not  have one) for your aftercare needs so that they can reassess your need for medications and monitor your lab values.     Today  Chief Complaint  Patient presents with  . Chest Pain    Patient is feeling fine. Denies any palpitations. No nausea denies chest pain ROS:  CONSTITUTIONAL: Denies fevers, chills. Denies any fatigue, weakness.  EYES: Denies blurry vision, double vision, eye pain. EARS, NOSE, THROAT: Denies tinnitus, ear pain, hearing loss. RESPIRATORY: Denies cough, wheeze, shortness of breath.  CARDIOVASCULAR: Denies chest pain, palpitations, edema.  GASTROINTESTINAL: Denies nausea, vomiting, diarrhea, abdominal pain. Denies bright red blood per rectum. GENITOURINARY: Denies dysuria, hematuria. ENDOCRINE: Denies nocturia or thyroid problems. HEMATOLOGIC AND LYMPHATIC: Denies easy bruising or bleeding. SKIN: Denies rash or lesion. MUSCULOSKELETAL: Denies pain in neck, back, shoulder, knees, hips or arthritic symptoms.  NEUROLOGIC: Denies paralysis, paresthesias.  PSYCHIATRIC: Denies anxiety or depressive symptoms.   VITAL SIGNS:  Blood pressure 111/60, pulse 91, temperature (!) 97.4 F (36.3 C),  temperature source Oral, resp. rate 18, height 6' (1.829 m), weight (!) 155.4 kg (342 lb 11.2 oz), SpO2 99 %.  I/O:    Intake/Output Summary (Last 24 hours) at 05/01/17 1528 Last data filed at 05/01/17 1359  Gross per 24 hour  Intake             1031 ml  Output             1850 ml  Net             -819 ml    PHYSICAL EXAMINATION:  GENERAL:  51 y.o.-year-old patient lying in the bed with no acute distress.  EYES: Pupils equal, round, reactive to light and accommodation. No scleral icterus. Extraocular muscles intact.  HEENT: Head atraumatic, normocephalic. Oropharynx and nasopharynx clear.  NECK:  Supple, no jugular venous distention. No thyroid enlargement, no tenderness.  LUNGS: Normal breath sounds bilaterally, no wheezing, rales,rhonchi or crepitation. No use of  accessory muscles of respiration.  CARDIOVASCULAR:Irregularly irregular. No murmurs, rubs, or gallops.  ABDOMEN: Soft, non-tender, non-distended. Bowel sounds present. No organomegaly or mass.  EXTREMITIES: No pedal edema, cyanosis, or clubbing.  NEUROLOGIC: Cranial nerves II through XII are intact. Muscle strength 5/5 in all extremities. Sensation intact. Gait not checked.  PSYCHIATRIC: The patient is alert and oriented x 3.  SKIN: No obvious rash, lesion, or ulcer.   DATA REVIEW:   CBC  Recent Labs Lab 04/30/17 0159  WBC 7.5  HGB 14.7  HCT 42.1  PLT 226    Chemistries   Recent Labs Lab 04/29/17 0917 04/29/17 1210  NA 138  --   K 4.0  --   CL 105  --   CO2 24  --   GLUCOSE 155*  --   BUN 12  --   CREATININE 1.06  --   CALCIUM 9.6  --   MG  --  2.1    Cardiac Enzymes  Recent Labs Lab 04/29/17 2006  TROPONINI 0.04*    Microbiology Results  Results for orders placed or performed in visit on 08/21/12  Beta Strep Culture [facility])     Status: None   Collection Time: 08/21/12  6:33 PM  Result Value Ref Range Status   Micro Text Report   Final       SOURCE: THROAT    ORGANISM 1                MODERATE GROWTH BETA STREPTOCOCCUS, GROUP F   COMMENT                   -   ANTIBIOTIC                                                        RADIOLOGY:  Dg Chest Port 1 View  Result Date: 04/29/2017 CLINICAL DATA:  Chest pain EXAM: PORTABLE CHEST 1 VIEW COMPARISON:  11/15/2013 chest radiograph. FINDINGS: Low lung volumes. Stable cardiomediastinal silhouette with top-normal heart size. No pneumothorax. No pleural effusion. Lungs appear clear, with no acute consolidative airspace disease and no pulmonary edema. IMPRESSION: Hypoinspiratory chest radiograph with no active cardiopulmonary disease. Electronically Signed   By: Delbert Phenix M.D.   On: 04/29/2017 09:37    EKG:   Orders placed or performed during the hospital encounter of 04/29/17  .  EKG 12-Lead  . EKG  12-Lead  . ED EKG  . ED EKG      Management plans discussed with the patient, family and they are in agreement.  CODE STATUS:     Code Status Orders        Start     Ordered   04/29/17 1022  Full code  Continuous     04/29/17 1023    Code Status History    Date Active Date Inactive Code Status Order ID Comments User Context   This patient has a current code status but no historical code status.      TOTAL TIME TAKING CARE OF THIS PATIENT: 43  minutes.   Note: This dictation was prepared with Dragon dictation along with smaller phrase technology. Any transcriptional errors that result from this process are unintentional.   @[facility] @  on 05/01/2017 at 3:28 PM  Between 7am to 6pm - Pager - [phone]  After 6pm go to [email or web address] - password [email or web address] [facility]  [facility]  Office  [phone]  CC: Primary care physician; Margaretann Loveless, PA-C

## 2017-05-06 ENCOUNTER — Telehealth: Payer: Self-pay | Admitting: *Deleted

## 2017-05-06 NOTE — Telephone Encounter (Signed)
-----   Message from Blain Pais sent at 05/06/2017 11:07 AM EDT ----- Regarding: tcm/ph 11/2 10 am Dr. Rockey Situ

## 2017-05-06 NOTE — Telephone Encounter (Signed)
Patient contacted regarding discharge from [facility] on 05/01/17.  Patient understands to follow up with provider Dr. Rockey Situ on 05/22/17 at 10:00AM at [facility]. Patient understands discharge instructions? Yes Patient understands medications and regiment? Yes Patient understands to bring all medications to this visit? Yes  Patient verbalized understanding of our conversation and advised him to please call if he should have any needs prior to appointment. He had no further questions or concerns at this time.

## 2017-05-06 NOTE — Telephone Encounter (Signed)
Left voicemail message for patient to call back.

## 2017-05-20 NOTE — Progress Notes (Signed)
Cardiology Office Note  Date:  05/22/2017   ID:  TREQUON PAONE, DOB Apr 28, 1966, MRN [id number]  PCP:  System, Pcp Not In   Chief Complaint  Patient presents with  . OTHER    F/u Afib c/o chest tightness. Meds reviewed verbally with pt.    HPI:  Mr. Kilian is a 51 year old gentleman with past medical history of Admission to the hospital October 2018 Atrial fibrillation with RVR, nonspecific chest pain Discharged on May 01, 2017 Echocardiogram with 55-60% ejection fraction  Dilated aortic root: 44 mm by echocardiogram.  Who presents to the office to establish care for his atrial fibrillation  Admitted to the hospital beginning of October 2018 for 3 days with atrial fibrillation RVR CPAP not working Obstructive sleep apnea felt to be a factor in his atrial fibrillation On arrival to the hospital he had chest discomfort, rapid heart rate Rate controlled with diltiazem IV and beta-blocker, started on anticoagulation  Discharged on diltiazem 120 mg daily, consolidate metoprolol to 50 mg twice a day, and discontinue heparin in favor of eliquis 5 mg twice a day.    prior episode of atrial fibrillation in 2011 broke in emergency department   Obstructive sleep apnea: CPAP machine broke some time ago. He knows he needs repeat sleep study and will need a new device afterwards.   Rheumatoid arthritis: On hydroxychloroquine.  Feels better on today's visit, Back in normal sinus rhythm  no complaints overall  EKG personally reviewed by myself on todays visit Shows normal sinus rhythm rate 58 bpm no significant ST or T wave changes   PMH:   has a past medical history of Arthritis; Dilated aortic root (HCC); GERD (gastroesophageal reflux disease); History of echocardiogram; Hypertension; Morbid obesity (HCC); PAF (paroxysmal atrial fibrillation) (HCC); Rheumatoid arthritis (HCC); and Sleep apnea.  PSH:    Past Surgical History:  Procedure Laterality Date  . ARTHROTOMY N/A   .  COLONOSCOPY WITH PROPOFOL N/A 03/12/2015   Procedure: COLONOSCOPY WITH PROPOFOL;  Surgeon: Scot Jun, MD;  Location: [facility] ENDOSCOPY;  Service: Endoscopy;  Laterality: N/A;  . complex repair leg    . ESOPHAGOGASTRODUODENOSCOPY (EGD) WITH PROPOFOL N/A 03/12/2015   Procedure: ESOPHAGOGASTRODUODENOSCOPY (EGD) WITH PROPOFOL;  Surgeon: Scot Jun, MD;  Location: [facility] ENDOSCOPY;  Service: Endoscopy;  Laterality: N/A;    Current Outpatient Prescriptions  Medication Sig Dispense Refill  . acetaminophen (TYLENOL) 325 MG tablet Take 2 tablets (650 mg total) by mouth every 6 (six) hours as needed for mild pain (or Fever >/= 101).    Marland Kitchen apixaban (ELIQUIS) 5 MG TABS tablet Take 1 tablet (5 mg total) by mouth 2 (two) times daily. 180 tablet 3  . diltiazem (CARDIZEM CD) 120 MG 24 hr capsule Take 1 capsule (120 mg total) by mouth daily. 90 capsule 3  . hydroxychloroquine (PLAQUENIL) 200 MG tablet Take 200 mg by mouth daily.  2  . metoprolol tartrate (LOPRESSOR) 50 MG tablet Take 1 tablet (50 mg total) by mouth 2 (two) times daily. 180 tablet 3   No current facility-administered medications for this visit.      Allergies:   Penicillins and Adalimumab   Social History:  The patient  reports that he has never smoked. He has quit using smokeless tobacco. He reports that he drinks about 0.6 oz of alcohol per week . He reports that he does not use drugs.   Family History:   family history includes Cancer in his father and mother.    Review of  Systems: Review of Systems  Constitutional: Negative.   Respiratory: Negative.   Cardiovascular: Negative.   Gastrointestinal: Negative.   Musculoskeletal: Negative.   Neurological: Negative.   Psychiatric/Behavioral: Negative.   All other systems reviewed and are negative.    PHYSICAL EXAM: VS:  BP 104/70 (BP Location: Left Wrist, Patient Position: Sitting, Cuff Size: Large)   Pulse (!) 58   Ht 6' (1.829 m)   Wt (!) 342 lb 8 oz (155.4 kg)   BMI  46.45 kg/m  , BMI Body mass index is 46.45 kg/m. GEN: Well nourished, well developed, in no acute distress  HEENT: normal  Neck: no JVD, carotid bruits, or masses Cardiac: RRR; no murmurs, rubs, or gallops,no edema  Respiratory:  clear to auscultation bilaterally, normal work of breathing GI: soft, nontender, nondistended, + BS MS: no deformity or atrophy  Skin: warm and dry, no rash Neuro:  Strength and sensation are intact Psych: euthymic mood, full affect    Recent Labs: 04/29/2017: BUN 12; Creatinine, Ser 1.06; Magnesium 2.1; Potassium 4.0; Sodium 138; TSH 0.614 04/30/2017: Hemoglobin 14.7; Platelets 226    Lipid Panel No results found for: CHOL, HDL, LDLCALC, TRIG    Wt Readings from Last 3 Encounters:  05/22/17 (!) 342 lb 8 oz (155.4 kg)  05/01/17 (!) 342 lb 11.2 oz (155.4 kg)  10/25/15 (!) 349 lb (158.3 kg)       ASSESSMENT AND PLAN:  Paroxysmal atrial fibrillation (HCC) - Plan: EKG 12-Lead Recent hospitalization for atrial fibrillation with rapid ventricular rate On today's visit and normal sinus rhythm Long discussion concerning various treatment options, recommended he stay on his current medications Medications refilled  Obstructive sleep apnea He is going to follow-up with the [facility] hospital, see if he can get new CPAP machine  Morbid obesity (HCC) We have encouraged continued exercise, careful diet management in an effort to lose weight.  Shortness of breath Breathing has improved now normal sinus rhythm  Chest pain, unspecified type Atypical chest pain likely from recent upper respiratory infection and coughing No further workup at this time  Disposition:   F/U  6 months   Total encounter time more than 25 minutes  Greater than 50% was spent in counseling and coordination of care with the patient    Orders Placed This Encounter  Procedures  . EKG 12-Lead     Signed, Dossie Arbour, M.D., Ph.D. 05/22/2017  [facility] [place],  [place] [phone]

## 2017-05-22 ENCOUNTER — Ambulatory Visit (INDEPENDENT_AMBULATORY_CARE_PROVIDER_SITE_OTHER): Payer: BLUE CROSS/BLUE SHIELD | Admitting: Cardiovascular Disease

## 2017-05-22 ENCOUNTER — Encounter: Payer: Self-pay | Admitting: Cardiovascular Disease

## 2017-05-22 VITALS — BP 104/70 | HR 58 | Ht 72.0 in | Wt 342.5 lb

## 2017-05-22 DIAGNOSIS — R0602 Shortness of breath: Secondary | ICD-10-CM

## 2017-05-22 DIAGNOSIS — I480 Paroxysmal atrial fibrillation: Secondary | ICD-10-CM

## 2017-05-22 DIAGNOSIS — G4733 Obstructive sleep apnea (adult) (pediatric): Secondary | ICD-10-CM | POA: Diagnosis not present

## 2017-05-22 DIAGNOSIS — R079 Chest pain, unspecified: Secondary | ICD-10-CM | POA: Diagnosis not present

## 2017-05-22 MED ORDER — DILTIAZEM HCL ER COATED BEADS 120 MG PO CP24: 120.0000 mg | ORAL_CAPSULE | Freq: Every day | ORAL | 3 refills | Status: DC

## 2017-05-22 MED ORDER — METOPROLOL TARTRATE 50 MG PO TABS
50.0000 mg | ORAL_TABLET | Freq: Two times a day (BID) | ORAL | 3 refills | Status: DC
Start: 2017-05-22 — End: 2018-09-23

## 2017-05-22 MED ORDER — APIXABAN 5 MG PO TABS: 5.0000 mg | ORAL_TABLET | Freq: Two times a day (BID) | ORAL | 3 refills | Status: DC

## 2017-05-22 NOTE — Patient Instructions (Signed)

## 2018-01-18 DIAGNOSIS — M0609 Rheumatoid arthritis without rheumatoid factor, multiple sites: Secondary | ICD-10-CM | POA: Diagnosis not present

## 2018-01-18 DIAGNOSIS — M19021 Primary osteoarthritis, right elbow: Secondary | ICD-10-CM | POA: Diagnosis not present

## 2018-01-18 DIAGNOSIS — M25711 Osteophyte, right shoulder: Secondary | ICD-10-CM | POA: Diagnosis not present

## 2018-01-18 DIAGNOSIS — M069 Rheumatoid arthritis, unspecified: Secondary | ICD-10-CM | POA: Diagnosis not present

## 2018-02-03 LAB — LIPID PANEL
Cholesterol: 171 (ref 0–200)
HDL: 40 (ref 35–70)
LDL Cholesterol: 102
Triglycerides: 146 (ref 40–160)

## 2018-02-03 LAB — PSA: PSA: 0.8

## 2018-02-17 DIAGNOSIS — M069 Rheumatoid arthritis, unspecified: Secondary | ICD-10-CM | POA: Diagnosis not present

## 2018-02-17 DIAGNOSIS — M1711 Unilateral primary osteoarthritis, right knee: Secondary | ICD-10-CM | POA: Diagnosis not present

## 2018-02-17 DIAGNOSIS — M1712 Unilateral primary osteoarthritis, left knee: Secondary | ICD-10-CM | POA: Diagnosis not present

## 2018-02-17 DIAGNOSIS — I4891 Unspecified atrial fibrillation: Secondary | ICD-10-CM | POA: Diagnosis not present

## 2018-02-19 DIAGNOSIS — M75101 Unspecified rotator cuff tear or rupture of right shoulder, not specified as traumatic: Secondary | ICD-10-CM | POA: Diagnosis not present

## 2018-02-19 DIAGNOSIS — M19041 Primary osteoarthritis, right hand: Secondary | ICD-10-CM | POA: Diagnosis not present

## 2018-02-19 DIAGNOSIS — M19042 Primary osteoarthritis, left hand: Secondary | ICD-10-CM | POA: Diagnosis not present

## 2018-02-19 DIAGNOSIS — M1990 Unspecified osteoarthritis, unspecified site: Secondary | ICD-10-CM | POA: Diagnosis not present

## 2018-02-24 DIAGNOSIS — M1711 Unilateral primary osteoarthritis, right knee: Secondary | ICD-10-CM | POA: Diagnosis not present

## 2018-02-26 DIAGNOSIS — M170 Bilateral primary osteoarthritis of knee: Secondary | ICD-10-CM | POA: Diagnosis not present

## 2018-02-26 DIAGNOSIS — M069 Rheumatoid arthritis, unspecified: Secondary | ICD-10-CM | POA: Diagnosis not present

## 2018-03-06 DIAGNOSIS — M25461 Effusion, right knee: Secondary | ICD-10-CM | POA: Diagnosis not present

## 2018-03-06 DIAGNOSIS — M1711 Unilateral primary osteoarthritis, right knee: Secondary | ICD-10-CM | POA: Diagnosis not present

## 2018-03-06 DIAGNOSIS — M06061 Rheumatoid arthritis without rheumatoid factor, right knee: Secondary | ICD-10-CM | POA: Diagnosis not present

## 2018-04-05 DIAGNOSIS — Z7952 Long term (current) use of systemic steroids: Secondary | ICD-10-CM | POA: Diagnosis not present

## 2018-04-05 DIAGNOSIS — M25561 Pain in right knee: Secondary | ICD-10-CM | POA: Diagnosis not present

## 2018-04-05 DIAGNOSIS — M25569 Pain in unspecified knee: Secondary | ICD-10-CM | POA: Diagnosis not present

## 2018-04-05 DIAGNOSIS — M1711 Unilateral primary osteoarthritis, right knee: Secondary | ICD-10-CM | POA: Diagnosis not present

## 2018-04-05 DIAGNOSIS — M069 Rheumatoid arthritis, unspecified: Secondary | ICD-10-CM | POA: Diagnosis not present

## 2018-04-28 ENCOUNTER — Telehealth: Payer: Self-pay | Admitting: Cardiovascular Disease

## 2018-04-28 NOTE — Telephone Encounter (Signed)
I called and spoke with the patient.  He is currently taking prednisone for his arthritis and has been given the option to take another drug, but it will require him coming off eliquis. He is unsure of the name of the drug, but states it starts with an "M" (? If this may be methotrexate).  I advised him that the concern with coming off eliquis in relation to his a-fib history is his risk of stroke. He was last seen 05/2017 with Dr. Rockey Situ.  He was to follow up in 6 months from that visit, but no appointment made and no current follow up scheduled.  The patient states that he has had no issues with feeling his heart skipping/ going in or out of rhythm.  I advised him that I would need to review with Dr. Rockey Situ and call him back with further recommendations. The patient voices understanding and is agreeable.

## 2018-04-28 NOTE — Telephone Encounter (Signed)
Pt c/o medication issue:  1. Name of Medication: Eliquis   2. How are you currently taking this medication (dosage and times per day)? 1 tablet 2 times daily  3. Are you having a reaction (difficulty breathing--STAT)? no  4. What is your medication issue? Patient calling, states that he has arthritis and it is getting worse. The pain medication that he wants to take will conflict and possibly react with Eliquis.  Would like to discuss with nurse if it will be possible to come off Eliquis.  Please call to discuss.

## 2018-04-29 NOTE — Telephone Encounter (Signed)
Pt given Dr. Donivan Scull recommendations and will let us know what he decides to do with his Eliquis since he knows it is a risk going off of it for a period of time.

## 2018-04-29 NOTE — Telephone Encounter (Signed)
Several options available 1 there is certainly a risk coming off Eliquis Perhaps he could do this for a short period of time such as several weeks try the meloxicam or NSAID which I believe it might be.  If no significant benefit will go back on the Eliquis for stroke prevention  2.  Stay on Eliquis and ask his provider for alternate medications Most of the NSAIDs such as Aleve meloxicam ibuprofen should be used cautiously to avoid GI bleeding No other blood thinner equivalent, aspirin will not do Warfarin Xarelto Eliquis all the same issue

## 2018-06-02 DIAGNOSIS — G4733 Obstructive sleep apnea (adult) (pediatric): Secondary | ICD-10-CM | POA: Diagnosis not present

## 2018-06-14 DIAGNOSIS — M25561 Pain in right knee: Secondary | ICD-10-CM | POA: Diagnosis not present

## 2018-06-14 DIAGNOSIS — M25512 Pain in left shoulder: Secondary | ICD-10-CM | POA: Diagnosis not present

## 2018-06-14 DIAGNOSIS — M069 Rheumatoid arthritis, unspecified: Secondary | ICD-10-CM | POA: Diagnosis not present

## 2018-06-14 DIAGNOSIS — M25511 Pain in right shoulder: Secondary | ICD-10-CM | POA: Diagnosis not present

## 2018-06-16 DIAGNOSIS — G4733 Obstructive sleep apnea (adult) (pediatric): Secondary | ICD-10-CM | POA: Diagnosis not present

## 2018-06-25 DIAGNOSIS — G4733 Obstructive sleep apnea (adult) (pediatric): Secondary | ICD-10-CM | POA: Diagnosis not present

## 2018-07-05 DIAGNOSIS — G4733 Obstructive sleep apnea (adult) (pediatric): Secondary | ICD-10-CM | POA: Diagnosis not present

## 2018-07-20 DIAGNOSIS — M25561 Pain in right knee: Secondary | ICD-10-CM | POA: Diagnosis not present

## 2018-07-22 ENCOUNTER — Other Ambulatory Visit: Payer: Self-pay | Admitting: Cardiovascular Disease

## 2018-07-26 DIAGNOSIS — G4733 Obstructive sleep apnea (adult) (pediatric): Secondary | ICD-10-CM | POA: Diagnosis not present

## 2018-07-27 DIAGNOSIS — M25511 Pain in right shoulder: Secondary | ICD-10-CM | POA: Diagnosis not present

## 2018-07-27 DIAGNOSIS — M25512 Pain in left shoulder: Secondary | ICD-10-CM | POA: Diagnosis not present

## 2018-07-27 DIAGNOSIS — M25561 Pain in right knee: Secondary | ICD-10-CM | POA: Diagnosis not present

## 2018-07-27 DIAGNOSIS — M25562 Pain in left knee: Secondary | ICD-10-CM | POA: Diagnosis not present

## 2018-08-13 ENCOUNTER — Other Ambulatory Visit: Payer: Self-pay | Admitting: Cardiovascular Disease

## 2018-08-25 DIAGNOSIS — G4733 Obstructive sleep apnea (adult) (pediatric): Secondary | ICD-10-CM | POA: Diagnosis not present

## 2018-08-27 DIAGNOSIS — G4733 Obstructive sleep apnea (adult) (pediatric): Secondary | ICD-10-CM | POA: Diagnosis not present

## 2018-09-03 DIAGNOSIS — M25561 Pain in right knee: Secondary | ICD-10-CM | POA: Diagnosis not present

## 2018-09-03 DIAGNOSIS — M25562 Pain in left knee: Secondary | ICD-10-CM | POA: Diagnosis not present

## 2018-09-13 DIAGNOSIS — M170 Bilateral primary osteoarthritis of knee: Secondary | ICD-10-CM | POA: Diagnosis not present

## 2018-09-20 NOTE — Progress Notes (Signed)
Cardiology Office Note  Date:  09/23/2018   ID:  Jeremy West, DOB [birth year], MRN [id number]  PCP:  System, Pcp Not In   Chief Complaint  Patient presents with  . other    6 mo follow up. Medicaions reviewed verbally with patient. "Doing Well" Arthritis issues.    HPI:  [name] is a 53 y.o. gentleman with past medical history of Admission to the hospital October 2018 Atrial fibrillation with RVR, nonspecific chest pain Discharged on May 01, 2017 Echocardiogram with 55-60% ejection fraction  Dilated aortic root: 44 mm by echocardiogram.  Who presents to the office to establish care for his atrial fibrillation  INTERVAL HISTORY:  The patient reports today for follow up. The patient is doing well overall today. He started taking meloxicam, so had to stop taking Eliquis, but it has not helped. Wants to start taking Eliquis soon again. Has rheumatoid arthritis that affects all his body and is taking tylenol arthritis, plaquenil, methotrexate, folic acid, prednisone, and Orencia. He has been taking a lot of prednisone since June 2019 which has caused his weight to increase drastically. His rheumatologist is decreasing his prednisone dosage in the near future.   Denies persistent atrial fibrillation since last visit. Has only had 2 episodes since then. He can tell when he's in A. Fib. Still compliant with diltiazem.   He is having trouble breathing at night. Got another sleep test done recently and now has a sleep apnea machine. Used to wake up about 20 times a night, which is down to 6 times. Is waking up at around 3 am which is preventing him from having good sleep. His nose is crushed internally due to a boxing match. He got CPAP machine at that time which did help sleep well all night.  Denies having a stroke or diabetes. Endorses his breathing is usually okay in the day.  Has not been able to walk regularly since June 2019 due to his weight gain and arthritis in knees.  Sometimes he can walk up to 3 miles at once, but sometimes he struggles to walk. Acknowledges unhealthy diet and is planning on improving it.   He plays very close to his symptoms and immediately seeks medical attention whenever necessary.   Blood pressure 140/84  EKG personally reviewed by myself on todays visit Shows normal sinus rhythm. 83 bpm.    OTHER PAST MEDICAL HISTORY REVIEWED BY ME FOR TODAY'S VISIT:    PMH:   has a past medical history of Arthritis, Dilated aortic root (HCC), GERD (gastroesophageal reflux disease), History of echocardiogram, Hypertension, Morbid obesity (HCC), PAF (paroxysmal atrial fibrillation) (HCC), Rheumatoid arthritis (HCC), and Sleep apnea.  PSH:    Past Surgical History:  Procedure Laterality Date  . ARTHROTOMY N/A   . COLONOSCOPY WITH PROPOFOL N/A 03/12/2015   Procedure: COLONOSCOPY WITH PROPOFOL;  Surgeon: Scot Jun, MD;  Location: [facility] ENDOSCOPY;  Service: Endoscopy;  Laterality: N/A;  . complex repair leg    . ESOPHAGOGASTRODUODENOSCOPY (EGD) WITH PROPOFOL N/A 03/12/2015   Procedure: ESOPHAGOGASTRODUODENOSCOPY (EGD) WITH PROPOFOL;  Surgeon: Scot Jun, MD;  Location: [facility] ENDOSCOPY;  Service: Endoscopy;  Laterality: N/A;    Current Outpatient Medications  Medication Sig Dispense Refill  . acetaminophen (TYLENOL) 325 MG tablet Take 2 tablets (650 mg total) by mouth every 6 (six) hours as needed for mild pain (or Fever >/= 101).    Marland Kitchen apixaban (ELIQUIS) 5 MG TABS tablet Take 1 tablet (5 mg total) by mouth 2 (two)  times daily. 180 tablet 3  . diltiazem (CARDIZEM CD) 120 MG 24 hr capsule TAKE 1 CAPSULE BY MOUTH EVERY DAY 15 capsule 0  . hydroxychloroquine (PLAQUENIL) 200 MG tablet Take 200 mg by mouth daily.  2  . predniSONE (DELTASONE) 5 MG tablet     . metoprolol tartrate (LOPRESSOR) 50 MG tablet Take 1 tablet (50 mg total) by mouth 2 (two) times daily. (Patient not taking: Reported on 09/23/2018) 180 tablet 3   No current  facility-administered medications for this visit.      Allergies:   Penicillins and Adalimumab   Social History:  The patient  reports that he has never smoked. He has quit using smokeless tobacco. He reports current alcohol use of about 1.0 standard drinks of alcohol per week. He reports that he does not use drugs.   Family History:   family history includes Cancer in his father and mother.    Review of Systems: Review of Systems  Constitutional: Negative.   Eyes: Negative.   Respiratory: Positive for shortness of breath.   Cardiovascular: Negative.   Gastrointestinal: Negative.   Genitourinary: Negative.   Musculoskeletal: Positive for joint pain.  Neurological: Negative.   Psychiatric/Behavioral: Negative.   All other systems reviewed and are negative.    PHYSICAL EXAM: VS:  BP 140/84 (BP Location: Left Arm, Patient Position: Sitting, Cuff Size: Large)   Pulse 83   Ht 6' (1.829 m)   Wt (!) 357 lb (161.9 kg)   BMI 48.42 kg/m  , BMI Body mass index is 48.42 kg/m.  Constitutional:  oriented to person, place, and time. No distress.  Obese  hENT:  Head: Grossly normal Eyes:  no discharge. No scleral icterus.  Neck: No JVD, no carotid bruits  Cardiovascular: Regular rate and rhythm, no murmurs appreciated  Pulmonary/Chest: Clear to auscultation bilaterally, no wheezes or rales Abdominal: Soft.  no distension.  no tenderness.  Musculoskeletal: Normal range of motion Neurological:  normal muscle tone. Coordination normal. No atrophy Skin: Skin warm and dry Psychiatric: normal affect, pleasant     Recent Labs: No results found for requested labs within last 8760 hours.    Lipid Panel No results found for: CHOL, HDL, LDLCALC, TRIG    Wt Readings from Last 3 Encounters:  09/23/18 (!) 357 lb (161.9 kg)  05/22/17 (!) 342 lb 8 oz (155.4 kg)  05/01/17 (!) 342 lb 11.2 oz (155.4 kg)       ASSESSMENT AND PLAN:  Paroxysmal atrial fibrillation (HCC) Plan: EKG  12-Lead 2 prior episodes of atrial fibrillation, nothing recently CHADS VASC of 1 He prefers not to be on anticoagulation given his severe arthritic issues He is not taking metoprolol and we will hold the medication To simplify his regiment recommended he increase diltiazem up to 180 mg daily  Obstructive sleep apnea Followed by the [facility] Feels his mask is not working and may need repeat sleep study Recommended he follow-up with the [place] or with pulmonary  Morbid obesity (HCC) Weight continues to be a major problem exacerbated by being on prednisone Long discussion concerning his diet, need for radical changes  Shortness of breath Chronic shortness of breath exacerbated by morbid obesity and deconditioning Recommended low carbohydrate diet for weight loss  Chest pain, unspecified type No recent chest pain, no further work-up needed at this time  Disposition:   F/U  12 months   Total encounter time more than 25 minutes  Greater than 50% was spent in counseling and coordination of  care with the patient    Orders Placed This Encounter  Procedures  . EKG 12-Lead   I, Jesus Reyes am acting as a scribe for Julien Nordmann, M.D., Ph.D.  I, Julien Nordmann, M.D. Ph.D., have reviewed the above documentation for accuracy and completeness, and I agree with the above.    Signed, Dossie Arbour, M.D., Ph.D. 09/23/2018  [facility] [place], [place] [phone]

## 2018-09-23 ENCOUNTER — Ambulatory Visit (INDEPENDENT_AMBULATORY_CARE_PROVIDER_SITE_OTHER): Payer: BLUE CROSS/BLUE SHIELD | Admitting: Cardiovascular Disease

## 2018-09-23 VITALS — BP 140/84 | HR 83 | Ht 72.0 in | Wt 357.0 lb

## 2018-09-23 DIAGNOSIS — I480 Paroxysmal atrial fibrillation: Secondary | ICD-10-CM | POA: Diagnosis not present

## 2018-09-23 DIAGNOSIS — R079 Chest pain, unspecified: Secondary | ICD-10-CM | POA: Diagnosis not present

## 2018-09-23 DIAGNOSIS — G4733 Obstructive sleep apnea (adult) (pediatric): Secondary | ICD-10-CM | POA: Diagnosis not present

## 2018-09-23 DIAGNOSIS — R0602 Shortness of breath: Secondary | ICD-10-CM

## 2018-09-23 MED ORDER — DILTIAZEM HCL ER COATED BEADS 180 MG PO CP24: 180.0000 mg | ORAL_CAPSULE | Freq: Every day | ORAL | 3 refills | Status: DC

## 2018-09-23 NOTE — Patient Instructions (Addendum)
Dr. Tami Ribas, ENT [place], [place]  Medication Instructions:  Your physician has recommended you make the following change in your medication:  1. STOP Eliquis 2. INCREASE Diltiazem to 180 mg once daily  If you need a refill on your cardiac medications before your next appointment, please call your pharmacy.    Lab work: No new labs needed   If you have labs (blood work) drawn today and your tests are completely normal, you will receive your results only by: Marland Kitchen MyChart Message (if you have MyChart) OR . A paper copy in the mail If you have any lab test that is abnormal or we need to change your treatment, we will call you to review the results.   Testing/Procedures: No new testing needed   Follow-Up: At [facility], you and your health needs are our priority.  As part of our continuing mission to provide you with exceptional heart care, we have created designated Provider Care Teams.  These Care Teams include your primary Cardiologist (physician) and Advanced Practice Providers (APPs -  Physician Assistants and Nurse Practitioners) who all work together to provide you with the care you need, when you need it.  . You will need a follow up appointment in 12 months .   Please call our office 2 months in advance to schedule this appointment.    . Providers on your designated Care Team:   . Murray Hodgkins, NP . Christell Faith, PA-C . Marrianne Mood, PA-C  Any Other Special Instructions Will Be Listed Below (If Applicable).  For educational health videos Log in to : [email or web address] Or : [email or web address], password : [email or web address]

## 2018-09-24 DIAGNOSIS — G4733 Obstructive sleep apnea (adult) (pediatric): Secondary | ICD-10-CM | POA: Diagnosis not present

## 2018-09-26 DIAGNOSIS — G4733 Obstructive sleep apnea (adult) (pediatric): Secondary | ICD-10-CM | POA: Diagnosis not present

## 2018-10-24 DIAGNOSIS — G4733 Obstructive sleep apnea (adult) (pediatric): Secondary | ICD-10-CM | POA: Diagnosis not present

## 2018-10-26 DIAGNOSIS — G4733 Obstructive sleep apnea (adult) (pediatric): Secondary | ICD-10-CM | POA: Diagnosis not present

## 2018-10-28 DIAGNOSIS — G4733 Obstructive sleep apnea (adult) (pediatric): Secondary | ICD-10-CM | POA: Diagnosis not present

## 2018-12-22 DIAGNOSIS — M542 Cervicalgia: Secondary | ICD-10-CM | POA: Diagnosis not present

## 2018-12-22 DIAGNOSIS — M069 Rheumatoid arthritis, unspecified: Secondary | ICD-10-CM | POA: Diagnosis not present

## 2019-01-25 ENCOUNTER — Ambulatory Visit
Admission: RE | Admit: 2019-01-25 | Discharge: 2019-01-25 | Disposition: A | Discharge status: Home / Self Care | Payer: 59 | Source: Ambulatory Visit | Attending: Internal Medicine | Admitting: Internal Medicine

## 2019-01-25 ENCOUNTER — Other Ambulatory Visit: Payer: Self-pay

## 2019-01-25 ENCOUNTER — Ambulatory Visit: Payer: 59 | Admitting: Internal Medicine

## 2019-01-25 ENCOUNTER — Encounter: Payer: Self-pay | Admitting: Internal Medicine

## 2019-01-25 VITALS — BP 136/88 | HR 85 | Temp 97.7°F | Resp 16 | Ht 72.0 in | Wt 357.0 lb

## 2019-01-25 DIAGNOSIS — R05 Cough: Secondary | ICD-10-CM

## 2019-01-25 DIAGNOSIS — Z6841 Body Mass Index (BMI) 40.0 and over, adult: Secondary | ICD-10-CM

## 2019-01-25 DIAGNOSIS — K219 Gastro-esophageal reflux disease without esophagitis: Secondary | ICD-10-CM

## 2019-01-25 DIAGNOSIS — I10 Essential (primary) hypertension: Secondary | ICD-10-CM

## 2019-01-25 DIAGNOSIS — R059 Cough, unspecified: Secondary | ICD-10-CM

## 2019-01-25 DIAGNOSIS — M069 Rheumatoid arthritis, unspecified: Secondary | ICD-10-CM

## 2019-01-25 MED ORDER — FAMOTIDINE 20 MG PO TABS: 20.0000 mg | ORAL_TABLET | Freq: Two times a day (BID) | ORAL | 5 refills | Status: DC

## 2019-01-25 NOTE — Progress Notes (Signed)
S - Patient presents for f/u after saw June 8, and after he noted ranitidine was great at controlling his GERD, he was changed to protonix as ranitidine no longer available and since, he noted it has worsened. H was on this one time in the past and noted it did not work very well, and and was not helpful when restarted and has subsequently stopped taking the med.  He noted when has pops, had [employer] the other day for lunch and caused bad reflux sx's - center of chest discomfort.  His weight has been increasing and he noted is due to the prednisone that he has been taking due to his RA/OA and is weaning that and almost ending the wean and trying to get off the low dose prednisone is taking presently.  He has continued to have increased cough, with the rhinitis sx's saw him for in June much better with the meds taken and he no longer has PND/sinus drainage. The cough is occas productive of a thicker whitish phlegm. He noted the cough seems to be worse in the hotter weather. Has had worsening cough in past when reflux worse, and that is the case in past weeks.  Cough is daily now.  Sometimes feels some chest discomfort in the center related to the cough, no left sided CP's with no radiation of concern,   No N/V,  No fevers, or other Covid sx's of concern when asked  No blood with BM's , no dark or black stools Not take oral  NSAIDs like aleve or ibuprofen, takes tylenol arthritis prn Alcohol  - denied any use No tob (quit 1995)  Activity level - no reg exercise   Current Outpatient Medications on File Prior to Visit  Medication Sig Dispense Refill  . Abatacept 125 MG/ML SOAJ Inject 125 mg into the skin once a week.    . diclofenac sodium (VOLTAREN) 1 % GEL Apply 4 g topically 4 (four) times daily.    Marland Kitchen diltiazem (CARDIZEM CD) 180 MG 24 hr capsule Take 1 capsule (180 mg total) by mouth daily. 90 capsule 3  . folic acid (FOLVITE) 1 MG tablet Take 1 mg by mouth daily.    . hydroxychloroquine (PLAQUENIL)  200 MG tablet Take 400 mg by mouth daily.   2  . methotrexate (50 MG/ML) 1 g injection 0.8 mLs once a week.    . predniSONE (DELTASONE) 1 MG tablet Take 1 mg by mouth as needed.     No current facility-administered medications on file prior to visit.     Stopped the protonix days ago Uses tylenol arthritis prn  Allergies  Allergen Reactions  . Penicillins Anaphylaxis    Childhood reaction Has patient had a PCN reaction causing immediate rash, facial/tongue/throat swelling, SOB or lightheadedness with hypotension: Unknown Has patient had a PCN reaction causing severe rash involving mucus membranes or skin necrosis: Unknown Has patient had a PCN reaction that required hospitalization: Unknown Has patient had a PCN reaction occurring within the last 10 years: No If all of the above answers are "NO", then may proceed with Cephalosporin use.   . Adalimumab       O - NAD, obese, masked  BP 136/88 (BP Location: Right Arm, Patient Position: Sitting, Cuff Size: Large)   Pulse 85   Temp 97.7 F (36.5 C) (Oral)   Resp 16   Ht 6' (1.829 m)   Wt (!) 357 lb (161.9 kg)   SpO2 97%   BMI 48.42 kg/m  Weight - 352.6 on June 8 visit, 342 July 2019 visit  HEENT - sclera anicteric, conj - non-inj'ed, no focal sinus tenderness Car - RRR without m/g/r Pulm - CTA, no rales or wheezes Chest - not painful today to press over mid sternum where he feels the reflux sx's Neck - Carotids without bruits Abd - obese, soft, not tender to palpate epigastric region, NT in the UQ's, no rebound or guarding,  Ext - no LE edema Affect not flat, approp with conversation  H. Pylori lab test neg 12/28/17  Ass/plan 1. Increased cough/GERD - ? If due to GERD worsening, noted H2 blocker was very effective and then when stopped and protonix re-started (PPI), symptoms worsened again. Educated on how the PPI is often stronger than the H2 blocker and better at resolving sx's for many.   - will add pepcid - 20mg   bid as he noted success with H2 blocker prior and felt reasonable   - GERD precautions reviewed at length to help, and the importance of these, and noted his increased risk, especially with the weight, and info provided in the AVS as well  - He is due for his annual PE and will get the exec panel fasting for that later this month and f/u after for the evaluation (check CBC and other studies with this)  - Discussed possible EGD pending response (GI ref \\needed  for this)  -felt best to check a CXR as well given the cough and length of time and the Covid pandemic presently and ordered  2. Morbid Obesity - weight loss strongly encouraged and he noted the prednisone was a big source to this. He is weaning this off at present and he noted he can then really try to lose some weight to help and emphasized the importance of this. Diet modifications needed to help with this.  3. RA/OA - cont with rheum f/u and management  4. HTN/h/o A fib - cont meds and f/u with cards, last saw 09/2018  5. Sleep Apnea - review current use status at upcoming PE  6. Colon polyps - removed 2016, one villous adenoma, two tubular adenomas - review need for f/u procedure at [facility] annual PE

## 2019-01-25 NOTE — Patient Instructions (Signed)

## 2019-01-26 ENCOUNTER — Telehealth: Payer: Self-pay

## 2019-01-26 NOTE — Telephone Encounter (Signed)
-----   Message from Towanda Malkin, MD sent at 01/26/2019  8:27 AM EDT ----- Patient not on MyChart to release results. Could you please call him and let him know his CXR was without any acute concerns.  Continue with plans from our recent visit.  Thanks, [facility]

## 2019-01-26 NOTE — Telephone Encounter (Signed)
Pt aware.

## 2019-02-15 ENCOUNTER — Encounter: Payer: Self-pay | Admitting: Internal Medicine

## 2019-02-17 ENCOUNTER — Encounter: Payer: 59 | Admitting: Internal Medicine

## 2019-02-23 ENCOUNTER — Ambulatory Visit: Payer: Self-pay

## 2019-02-23 ENCOUNTER — Other Ambulatory Visit: Payer: Self-pay

## 2019-02-23 DIAGNOSIS — Z01818 Encounter for other preprocedural examination: Secondary | ICD-10-CM

## 2019-02-23 LAB — POCT URINALYSIS DIPSTICK
Bilirubin, UA: NEGATIVE
Blood, UA: NEGATIVE
Glucose, UA: NEGATIVE
Ketones, UA: NEGATIVE
Leukocytes, UA: NEGATIVE
Nitrite, UA: NEGATIVE
Protein, UA: NEGATIVE
Spec Grav, UA: >=1.03 — AB (ref 1.010–1.025)
Urobilinogen, UA: 0.2 E.U./dL
pH, UA: 5.5 (ref 5.0–8.0)

## 2019-02-24 LAB — CMP12+LP+TP+TSH+6AC+PSA+CBC…
ALT: 25 IU/L (ref 0–44)
AST: 24 IU/L (ref 0–40)
Albumin/Globulin Ratio: 1.9 (ref 1.2–2.2)
Albumin: 4.4 g/dL (ref 3.8–4.9)
Alkaline Phosphatase: 77 IU/L (ref 39–117)
BUN/Creatinine Ratio: 10 (ref 9–20)
BUN: 10 mg/dL (ref 6–24)
Basophils Absolute: 0 10*3/uL (ref 0.0–0.2)
Basos: 0 %
Bilirubin Total: 1.3 mg/dL — ABNORMAL HIGH (ref 0.0–1.2)
Calcium: 9.4 mg/dL (ref 8.7–10.2)
Chloride: 102 mmol/L (ref 96–106)
Chol/HDL Ratio: 4.9 ratio (ref 0.0–5.0)
Cholesterol, Total: 183 mg/dL (ref 100–199)
Creatinine, Ser: 0.97 mg/dL (ref 0.76–1.27)
EOS (ABSOLUTE): 0.1 10*3/uL (ref 0.0–0.4)
Eos: 2 %
Estimated CHD Risk: 1 times avg. (ref 0.0–1.0)
Free Thyroxine Index: 1.3 (ref 1.2–4.9)
GFR calc Af Amer: 103 mL/min/{1.73_m2} (ref >59)
GFR calc non Af Amer: 89 mL/min/{1.73_m2} (ref >59)
GGT: 30 IU/L (ref 0–65)
Globulin, Total: 2.3 g/dL (ref 1.5–4.5)
Glucose: 101 mg/dL — ABNORMAL HIGH (ref 65–99)
HDL: 37 mg/dL — ABNORMAL LOW (ref >39)
Hematocrit: 44.8 % (ref 37.5–51.0)
Hemoglobin: 14.9 g/dL (ref 13.0–17.7)
Immature Grans (Abs): 0 10*3/uL (ref 0.0–0.1)
Immature Granulocytes: 0 %
Iron: 76 ug/dL (ref 38–169)
LDH: 168 IU/L (ref 121–224)
LDL Calculated: 111 mg/dL — ABNORMAL HIGH (ref 0–99)
Lymphocytes Absolute: 1.3 10*3/uL (ref 0.7–3.1)
Lymphs: 22 %
MCH: 26.8 pg (ref 26.6–33.0)
MCHC: 33.3 g/dL (ref 31.5–35.7)
MCV: 80 fL (ref 79–97)
Monocytes Absolute: 0.5 10*3/uL (ref 0.1–0.9)
Monocytes: 9 %
Neutrophils Absolute: 4.1 10*3/uL (ref 1.4–7.0)
Neutrophils: 67 %
Phosphorus: 3.1 mg/dL (ref 2.8–4.1)
Platelets: 242 10*3/uL (ref 150–450)
Potassium: 4.5 mmol/L (ref 3.5–5.2)
Prostate Specific Ag, Serum: 0.9 ng/mL (ref 0.0–4.0)
RBC: 5.57 x10E6/uL (ref 4.14–5.80)
RDW: 14.1 % (ref 11.6–15.4)
Sodium: 139 mmol/L (ref 134–144)
T3 Uptake Ratio: 23 % — ABNORMAL LOW (ref 24–39)
T4, Total: 5.7 ug/dL (ref 4.5–12.0)
TSH: 0.584 u[IU]/mL (ref 0.450–4.500)
Total Protein: 6.7 g/dL (ref 6.0–8.5)
Triglycerides: 175 mg/dL — ABNORMAL HIGH (ref 0–149)
Uric Acid: 5.9 mg/dL (ref 3.7–8.6)
VLDL Cholesterol Cal: 35 mg/dL (ref 5–40)
WBC: 6.1 10*3/uL (ref 3.4–10.8)

## 2019-03-10 ENCOUNTER — Ambulatory Visit: Payer: 59 | Admitting: Internal Medicine

## 2019-03-10 ENCOUNTER — Encounter: Payer: Self-pay | Admitting: Internal Medicine

## 2019-03-10 ENCOUNTER — Other Ambulatory Visit: Payer: Self-pay

## 2019-03-10 DIAGNOSIS — R05 Cough: Secondary | ICD-10-CM | POA: Insufficient documentation

## 2019-03-10 DIAGNOSIS — Z860100 Personal history of colon polyps, unspecified: Secondary | ICD-10-CM | POA: Insufficient documentation

## 2019-03-10 DIAGNOSIS — I10 Essential (primary) hypertension: Secondary | ICD-10-CM

## 2019-03-10 DIAGNOSIS — R7301 Impaired fasting glucose: Secondary | ICD-10-CM | POA: Insufficient documentation

## 2019-03-10 DIAGNOSIS — M069 Rheumatoid arthritis, unspecified: Secondary | ICD-10-CM

## 2019-03-10 DIAGNOSIS — K219 Gastro-esophageal reflux disease without esophagitis: Secondary | ICD-10-CM

## 2019-03-10 DIAGNOSIS — R059 Cough, unspecified: Secondary | ICD-10-CM

## 2019-03-10 DIAGNOSIS — I77810 Thoracic aortic ectasia: Secondary | ICD-10-CM | POA: Insufficient documentation

## 2019-03-10 DIAGNOSIS — I480 Paroxysmal atrial fibrillation: Secondary | ICD-10-CM

## 2019-03-10 DIAGNOSIS — G4733 Obstructive sleep apnea (adult) (pediatric): Secondary | ICD-10-CM

## 2019-03-10 NOTE — Progress Notes (Signed)
S  - 53 y.o. white male who presents for annual physical evaluation, last saw in July for worsening GERD sx's with increasing cough on a PPI, and had prior success with ranitidine which was no longer available and despite noting the PPI is usually better than a H2 Blocker, Pepcid was added to assess response and he noted has been great and cough essentially resolved and doing well on just the Pepcid (stopped PPI).  A CXR then was ok.   Using CPAP more regularly and still awakening at night at times and told not related to the CPAP machine but likely due to pain with the RA when awakens. [facility] as well.   Saw the cardiologist March 2020 and ECG then ok and told all was ok. No mention of f/u ECHO when asked (had mod dilated aortic root in past, on 2018 ECHO). He has had two episodes of A Fib in his past, the last one was hospitalized in 2018 and that prompted the f/u's with cardiology.   He notes his wieght is back up, 360, and at one point was down to 320, with being back on the prednisone for his RA a big factor and the Covid restrictions also an issue. He notes he now is finally weaned off the prednisone and plans to get more active and go back to more strict diet and he noted he will lose weight and his cholesterol numbers will improve as well as they did in past when he lost weight.  He has not had the f/u colonoscopy when asked and noted he simply cannot afford this and he is trying to improve his finances and not there yet. I asked if the [facility] could be an option to help with this and he will inquire.   Still has a lot of joint pains with his RA, and working thru them. No other specific complaints, denies any recent CP, palpitations, SOB, abdominal pains, change in bowel habits, no dark/black stools, had an occas BRBPR noted and has hemorrhoids as source, no major vision changes with upcoming eye doctor appt next week, no recent fevers, or other Covid  concerning sx's. Not up at night to pee usually, no urgency or frequency, no major LE swelling, although notes tough to tell with his RA sx's.   Exercise - no regular exercise presently Diet - notes not adherent to a very healthy diet in recent past with hunger from oral steroids not helping  Meds reviewed   Current Outpatient Medications on File Prior to Visit  Medication Sig Dispense Refill  . Abatacept 125 MG/ML SOAJ Inject 125 mg into the skin once a week.    . diclofenac sodium (VOLTAREN) 1 % GEL Apply 4 g topically 4 (four) times daily.    Marland Kitchen diltiazem (CARDIZEM CD) 180 MG 24 hr capsule Take 1 capsule (180 mg total) by mouth daily. 90 capsule 3  . famotidine (PEPCID) 20 MG tablet Take 20 mg by mouth 2 (two) times daily.    . folic acid (FOLVITE) 1 MG tablet Take 1 mg by mouth daily.    . hydroxychloroquine (PLAQUENIL) 200 MG tablet Take 400 mg by mouth daily.   2  . methotrexate (50 MG/ML) 1 g injection 0.8 mLs once a week.    . naproxen sodium (ALEVE) 220 MG tablet Take 440 mg by mouth daily as needed.     No current facility-administered medications on file prior to visit.  Allergies  Allergen Reactions  . Penicillins Anaphylaxis    Childhood reaction Has patient had a PCN reaction causing immediate rash, facial/tongue/throat swelling, SOB or lightheadedness with hypotension: Unknown Has patient had a PCN reaction causing severe rash involving mucus membranes or skin necrosis: Unknown Has patient had a PCN reaction that required hospitalization: Unknown Has patient had a PCN reaction occurring within the last 10 years: No If all of the above answers are "NO", then may proceed with Cephalosporin use.   . Adalimumab     No tob use (quit 1995) Alcohol use - denied any use  FH - bone, lung and thyroid CA's noted  O - NAD, masked, obese  BP 123/85 (BP Location: Right Arm, Patient Position: Sitting, Cuff Size: Large) Comment (Cuff Size): thigh cuff  Pulse 77   Temp  98.4 F (36.9 C) (Oral)   Resp 16   Ht 6\' 1"  (1.854 m)   Wt (!) 360 lb (163.3 kg)   SpO2 96%   BMI 47.50 kg/m    BP - 136/88 July 7 visit Weight - 352.6 on June 8 visit, 342 July 2019 visit  HEENT - sclera anicteric, PERRL, EOMI, + glasses, conj - non-inj'ed, No sinus tenderness, TM's and canals clear Neck - supple, no adenopathy, no TM, carotids 2+ and = without bruits bilat Car - RRR without m/g/r Pulm- CTA without wheeze or rales Abd - soft, obese, NT, ND, BS+, no obvious HSM, no masses Back - no CVA tenderness Skin- no new lesions of concern on exposed areas, denied otherwise Ext - trace LE edema, no active joints GU - no swelling in inguinal/suprapubic region, NT,  testicle and prostate exams deferred (without concerning sx's and after discussion on current recommendations for prostate CA screening including PSA tests) Neuro - affect was not flat, appropriate with conversation  Grossly non-focal with good strength on testing, sensation intact to LT in distal extremities, DTR's 2+ and = patella, Romberg neg, no pronator drift, limited balance on one foot for any prolonged time (noted bothers his knees), good finger to nose   Labs reviewed - glucose - 101, T. Bili - 1.3 (higher prior) and other LFTs ok, TG - 175, HDL - 37, LDL - 111 (all worse from last readings noted), PSA -0.9, CBC - ok (not anemic) ECG reviewed - no concerning changes from prior ECG Colonoscopy screening discussed and reviewed - prior adenomas - one villous, two tubular and from 2016 study, f/u rec'ed and not yet done (with cost the issue for him noted)  CXR 7/7 - neg H. Pylori lab test neg 12/28/17  Ass/plan 1. Increased cough/GERD - much improved with Pepcid product?              - Cont pepcid - 20mg  bid              - GERD precautions reviewed at length prior and to cont   - CXR ok on recent check    - Monitor presently  2. Morbid Obesity - weight loss strongly encouraged and he noted the prednisone  was a big source to this. He has weaned off at present and he noted he can really try to lose some weight to help and emphasized the importance of this. Diet modifications needed to help with this and increased physical activity as he plans also needed to help.  3. RA/OA - cont with rheum f/u and management and goes thru [place] now   Cont meds and f/u labs needed  with these meds and he noted he gets thru the [place] now  Keep \\eye  MD f/u planned next week as needs annually with med taking noted  Weight loss will help with this as well   4. HTN/h/o A fib/mod dilated aortic root - cont meds and f/u with cards as rec'ed, last saw 09/2018   I noted concern with the dilated aortic root noted on prior ECHO and ideally would be good to get a f/u ECHO to ensure not increasing. Again cost an issue for him presently and hoping can pursue in the not too distant future noted as well  Cont cardizem  5. Sleep Apnea - continue with CPAP  6. Colon polyps - removed 2016, one villous adenoma, two tubular adenomas - rec'ed f/u colonoscopy and hoping he will be able to do in near future (cost concern presently). Noted possibly the [facility] may be able to help with this and he will inquire  7. IFG - diet and exercise planned to help and noted this is prediabetes and educated on this.   Also weight loss will help and encouraged

## 2019-03-29 ENCOUNTER — Other Ambulatory Visit: Payer: Self-pay

## 2019-03-29 NOTE — Telephone Encounter (Signed)
Received a Rx Refill Request from [facility] for Pantoprazole 40 mg 1 tab po qd.  Paper chart has medication D/C'd & Epic has Pepcid listed as medication he's taking.  Tried to [employer].  Went straight to voice mail.  Left a call back message.  AMD

## 2019-03-29 NOTE — Telephone Encounter (Signed)
Scott called back & said he's taking the famotidine 20 mg 1 tab po bid.  He's not taking pantoprazole.  Informed [facility] that Nicki Reaper is no longer taking pantoprazole.  AMD

## 2019-05-11 ENCOUNTER — Telehealth: Payer: Self-pay | Admitting: General Practice

## 2019-05-11 ENCOUNTER — Encounter: Payer: Self-pay | Admitting: General Practice

## 2019-05-11 NOTE — Telephone Encounter (Signed)
Patient does not have FSA and preferred to buy medication otc.  Patient to restartOTC flonase 1 spray each nostril BID, nasal saline 2 sprays each nostril q2h wa, and otc antihistamine of his choice. Patient denied personal or family history of ENT cancer.  Avoid triggers if possible.  Shower prior to bedtime if exposed to triggers.  If allergic dust/dust mites recommend mattress/pillow covers/encasements; washing linens, vacuuming, sweeping, dusting weekly.  Call or return to clinic as needed if these symptoms worsen or fail to improve as anticipated.    P2:  Avoidance and hand washing. Noted in paper record he has previously taken claritin 10mg  or allegra 180mg  po daily.  Work excuse x 48 hours and quarantine to ascertain if this is normal seasonal allergies or covid/viral URI.  Testing on day 1 of symptoms not helpful as typically negative covid test.  If no relief with plan of care will plan for drive thru covid testing on Monday 26 Oct and continue 14 day quarantine.   Discuss with patient people with COVID-19 have had a wide range of symptoms reported - ranging from mild symptoms to severe illness. Symptoms may appear 2-14 days after exposure to the virus.  Currently there are more people in community with covid/surge in the past week.   People with these symptoms may have COVID-19:  Fever or chills Cough Shortness of breath or difficulty breathing Fatigue Muscle or body aches Headache New loss of taste or smell Sore throat Congestion or runny nose Nausea or vomiting Diarrhea This list does not include all possible symptoms. CDC will continue to update this list as we learn more about COVID-19.  When to seek emergency medical attention Look for emergency warning signs* for COVID-19. If someone is showing any of these signs, seek emergency medical care immediately:  Trouble breathing Persistent pain or pressure in the chest New confusion Inability to wake or stay awake Bluish lips or  face *This list is not all possible symptoms. Please call your medical provider for any other symptoms that are severe or concerning to you.  Call 911 or call ahead to your local emergency facility: Notify the operator that you are seeking care for someone who has or may have COVID-19.    Patient preferred [facility] for drive thru testing.  If testing indicated later this week he is to arrive between 0830 -1530. Test results typically returning in 24-36 hours.

## 2019-05-11 NOTE — Telephone Encounter (Signed)
In the last 24 hours have you experienced any of these symptoms  Difficulty breathing  no  Nasal congestion  yes  New cough  yes  Runny nose  yes  Shortness of breath  no  New sore throat  no  Unexplained body aches  no  Nausea or vomiting  no  Diarrhea  no  Loss of taste or smell  no  Fever (temp>100 F/37.8 C) or chills  N/a  No chills  Exposure:   Have you been in close contact with someone with confirmed diagnosis of COVID or person under going testing in past 14 days?  no   Have you been tested for COVID? If yes date, location, results in known  no   Have you been living in the same home as a person with confirmed diagnosis of COVID or a person undergoing testing? (household contact)  no   Have you been diagnosed with COVID or are you waiting on test results? no   Have you traveled anywhere in the past 4 weeks? 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   Lives alone  Called in today due to lack of sleep.  Sx started this morning around 1 am. The pressure is so bad it woke him up. He is take Mucus DM. No Headache

## 2019-05-13 NOTE — Telephone Encounter (Signed)
Negative SARS Co-V NAA test results per Care Everywhere Patient tested at [facility] affiliated.  RN Vinnie Level Long notified HR and spoke with patient.

## 2019-05-13 NOTE — Telephone Encounter (Signed)
Notified today that RN Vinnie Level sent patient for covid testing because he is unable to utilize [employer] benefits without the test it is their protocol when quarantining at home.  Discussed with RN Vinnie Level that NEJM study reported that up to 40% of covid tests false negative when performed within first seven days of symptoms and that a negative test at this time does not indicate patient may be released from quarantine once negative results received.  She verbalized understanding and had no further questions at this time.  Epic does not have covid testing results available at this time.  Patient aware to check his mychart for results counseled by RN Harrie Foreman.

## 2019-05-17 NOTE — Telephone Encounter (Signed)
Patient released back to work today and feeling better.

## 2019-05-31 ENCOUNTER — Ambulatory Visit: Payer: Self-pay | Admitting: Physician Assistant

## 2019-05-31 ENCOUNTER — Encounter: Payer: Self-pay | Admitting: Physician Assistant

## 2019-05-31 ENCOUNTER — Other Ambulatory Visit: Payer: Self-pay

## 2019-05-31 VITALS — BP 140/90 | HR 108 | Temp 99.2°F | Resp 16 | Ht 72.0 in | Wt 354.0 lb

## 2019-05-31 DIAGNOSIS — W57XXXA Bitten or stung by nonvenomous insect and other nonvenomous arthropods, initial encounter: Secondary | ICD-10-CM

## 2019-05-31 MED ORDER — MUPIROCIN CALCIUM 2 % EX CREA: 1.0000 "application " | TOPICAL_CREAM | Freq: Two times a day (BID) | CUTANEOUS | 0 refills | Status: DC

## 2019-05-31 NOTE — Progress Notes (Signed)
Subjective:infected insect bte    Patient ID: Jeremy West, male    DOB: 1965-09-14, 53 y.o.   MRN: [id number]  HPI Patient present with bite to left fifth finger. Incident occurred yesterday morning. Vesicular lesion this morning with mild pain. Denies loss of function. Lesion rupture prior to arrival Review of Systems    Hypertension Objective:   Physical Exam Rupture vesicular lesion fifth digit left have .       Assessment & Plan:  Infective insect bite to finger. Advised to keep clean and dry.  Apply ointment to area twice a day. Follow up as needed.

## 2019-05-31 NOTE — Progress Notes (Signed)
Base of left 5th finger - swollen & draining serous drainage.  Felt the bite yesterday. Woke up this morning with the blister on the finger. Scratched it today & that's when it started draining fluid. No topical ointments or creams used. No bandage used - states just ruptured right before coming to our office.   Tender to touch, but not as bad now that it's draining. No itching or burning.  AMD

## 2019-09-20 ENCOUNTER — Other Ambulatory Visit: Payer: Self-pay

## 2019-09-20 DIAGNOSIS — M059 Rheumatoid arthritis with rheumatoid factor, unspecified: Secondary | ICD-10-CM

## 2019-09-20 DIAGNOSIS — I10 Essential (primary) hypertension: Secondary | ICD-10-CM

## 2019-09-20 NOTE — Progress Notes (Signed)
Patient comes in today for labs ordered by the [place], NP. Fax# [phone] Rheumatology [facility]

## 2019-09-21 LAB — HEPATIC FUNCTION PANEL
ALT: 54 IU/L — ABNORMAL HIGH (ref 0–44)
AST: 43 IU/L — ABNORMAL HIGH (ref 0–40)
Alkaline Phosphatase: 79 IU/L (ref 39–117)
Bilirubin Total: 1.2 mg/dL (ref 0.0–1.2)
Bilirubin, Direct: 0.25 mg/dL (ref 0.00–0.40)
Total Protein: 6.5 g/dL (ref 6.0–8.5)

## 2019-09-21 LAB — CBC WITH DIFFERENTIAL/PLATELET
Basophils Absolute: 0 10*3/uL (ref 0.0–0.2)
Basos: 0 %
EOS (ABSOLUTE): 0.2 10*3/uL (ref 0.0–0.4)
Eos: 3 %
Hematocrit: 42.3 % (ref 37.5–51.0)
Hemoglobin: 14.7 g/dL (ref 13.0–17.7)
Immature Grans (Abs): 0 10*3/uL (ref 0.0–0.1)
Immature Granulocytes: 0 %
Lymphocytes Absolute: 1.1 10*3/uL (ref 0.7–3.1)
Lymphs: 19 %
MCH: 26.6 pg (ref 26.6–33.0)
MCHC: 34.8 g/dL (ref 31.5–35.7)
MCV: 77 fL — ABNORMAL LOW (ref 79–97)
Monocytes Absolute: 0.4 10*3/uL (ref 0.1–0.9)
Monocytes: 7 %
Neutrophils Absolute: 4.2 10*3/uL (ref 1.4–7.0)
Neutrophils: 71 %
Platelets: 241 10*3/uL (ref 150–450)
RBC: 5.52 x10E6/uL (ref 4.14–5.80)
RDW: 14.9 % (ref 11.6–15.4)
WBC: 5.8 10*3/uL (ref 3.4–10.8)

## 2019-09-21 LAB — RENAL FUNCTION PANEL
Albumin: 4.2 g/dL (ref 3.8–4.9)
BUN/Creatinine Ratio: 9 (ref 9–20)
BUN: 8 mg/dL (ref 6–24)
CO2: 19 mmol/L — ABNORMAL LOW (ref 20–29)
Calcium: 9 mg/dL (ref 8.7–10.2)
Chloride: 104 mmol/L (ref 96–106)
Creatinine, Ser: 0.91 mg/dL (ref 0.76–1.27)
GFR calc Af Amer: 111 mL/min/{1.73_m2} (ref >59)
GFR calc non Af Amer: 96 mL/min/{1.73_m2} (ref >59)
Glucose: 147 mg/dL — ABNORMAL HIGH (ref 65–99)
Phosphorus: 3 mg/dL (ref 2.8–4.1)
Potassium: 4.4 mmol/L (ref 3.5–5.2)
Sodium: 137 mmol/L (ref 134–144)

## 2019-10-05 ENCOUNTER — Telehealth: Payer: Self-pay

## 2019-10-05 NOTE — Telephone Encounter (Signed)
Faxed lab results collected on 09/20/2019 to Dr. Domenic Polite at the [facility] in [place] [phone].  AMD

## 2019-10-27 ENCOUNTER — Encounter: Payer: Self-pay | Admitting: Physician Assistant

## 2019-10-27 ENCOUNTER — Other Ambulatory Visit: Payer: Self-pay

## 2019-10-27 ENCOUNTER — Ambulatory Visit: Payer: Self-pay | Admitting: Physician Assistant

## 2019-10-27 VITALS — BP 130/95 | HR 85 | Temp 98.8°F | Resp 16 | Ht 72.0 in | Wt 353.0 lb

## 2019-10-27 DIAGNOSIS — R21 Rash and other nonspecific skin eruption: Secondary | ICD-10-CM

## 2019-10-27 MED ORDER — HYDROXYZINE HCL 50 MG PO TABS: 50.0000 mg | ORAL_TABLET | Freq: Three times a day (TID) | ORAL | 0 refills | Status: DC | PRN

## 2019-10-27 MED ORDER — DOXYCYCLINE MONOHYDRATE 100 MG PO CAPS: 100.0000 mg | ORAL_CAPSULE | Freq: Two times a day (BID) | ORAL | 0 refills | Status: DC

## 2019-10-27 MED ORDER — METHYLPREDNISOLONE 4 MG PO TBPK: ORAL_TABLET | ORAL | 0 refills | Status: DC

## 2019-10-27 NOTE — Progress Notes (Signed)
Subjective: Rash    Patient ID: Jeremy West, male    DOB: 24-Feb-1966, 54 y.o.   MRN: [id number]  HPI Patient presents with rash to upper/ lower extremity for 2 weeks.  Patient states intense itching with rash causing secondary infection to the left arm.  Patient rest of body is spared.  Patient state he applied Bactroban to the lesion on the left arm secondary to a small amount of purulent material expressed while scratching.  Denies change of personal hygiene products.  Denies new laundry products.  Review of Systems     Objective:   Physical Exam No acute distress.  Erythematous macular lesions upper lower extremities.  Signs excoriation.  No drainage at this time       Assessment & Plan:  Nonspecific rash with secondary cellulitis.  Patient given a prescription for Atarax, Bactrim DS, and Medrol Dosepak.  Patient advised to take pictures of the rash before starting medication.  If no improvement in 5 to 7 days will consider dermatology consult.

## 2019-10-27 NOTE — Progress Notes (Signed)
Rash on arms & legs - itching all over - mostly at night. Going on for 2 weeks. Tried lotion without relief Tried switching laundry detergents without relief. Took OTC [facility] "allergy medicine" - generic benadry q 4 hrs prn without relief. No topical medications.  AMD

## 2019-11-08 ENCOUNTER — Other Ambulatory Visit: Payer: Self-pay

## 2019-11-08 ENCOUNTER — Encounter: Payer: Self-pay | Admitting: Emergency Medicine

## 2019-11-08 ENCOUNTER — Emergency Department: Payer: 59

## 2019-11-08 ENCOUNTER — Ambulatory Visit: Payer: Self-pay | Admitting: Emergency Medicine

## 2019-11-08 ENCOUNTER — Observation Stay
Admission: EM | Admit: 2019-11-08 | Discharge: 2019-11-10 | Disposition: A | Discharge status: Home / Self Care | Payer: 59 | Attending: General Surgery | Admitting: General Surgery

## 2019-11-08 VITALS — BP 110/58 | HR 89 | Temp 98.0°F

## 2019-11-08 DIAGNOSIS — M069 Rheumatoid arthritis, unspecified: Secondary | ICD-10-CM | POA: Insufficient documentation

## 2019-11-08 DIAGNOSIS — Z880 Allergy status to penicillin: Secondary | ICD-10-CM | POA: Insufficient documentation

## 2019-11-08 DIAGNOSIS — K8010 Calculus of gallbladder with chronic cholecystitis without obstruction: Secondary | ICD-10-CM | POA: Diagnosis not present

## 2019-11-08 DIAGNOSIS — G4730 Sleep apnea, unspecified: Secondary | ICD-10-CM | POA: Insufficient documentation

## 2019-11-08 DIAGNOSIS — I10 Essential (primary) hypertension: Secondary | ICD-10-CM | POA: Diagnosis not present

## 2019-11-08 DIAGNOSIS — Z79899 Other long term (current) drug therapy: Secondary | ICD-10-CM | POA: Insufficient documentation

## 2019-11-08 DIAGNOSIS — Z6841 Body Mass Index (BMI) 40.0 and over, adult: Secondary | ICD-10-CM | POA: Insufficient documentation

## 2019-11-08 DIAGNOSIS — Z20822 Contact with and (suspected) exposure to covid-19: Secondary | ICD-10-CM | POA: Insufficient documentation

## 2019-11-08 DIAGNOSIS — I480 Paroxysmal atrial fibrillation: Secondary | ICD-10-CM | POA: Diagnosis not present

## 2019-11-08 DIAGNOSIS — K810 Acute cholecystitis: Secondary | ICD-10-CM | POA: Diagnosis present

## 2019-11-08 DIAGNOSIS — Z888 Allergy status to other drugs, medicaments and biological substances status: Secondary | ICD-10-CM | POA: Insufficient documentation

## 2019-11-08 DIAGNOSIS — R079 Chest pain, unspecified: Secondary | ICD-10-CM

## 2019-11-08 DIAGNOSIS — K8020 Calculus of gallbladder without cholecystitis without obstruction: Secondary | ICD-10-CM

## 2019-11-08 DIAGNOSIS — R1011 Right upper quadrant pain: Secondary | ICD-10-CM

## 2019-11-08 LAB — BASIC METABOLIC PANEL
Anion gap: 7 (ref 5–15)
BUN: 10 mg/dL (ref 6–20)
CO2: 26 mmol/L (ref 22–32)
Calcium: 9.1 mg/dL (ref 8.9–10.3)
Chloride: 103 mmol/L (ref 98–111)
Creatinine, Ser: 1.09 mg/dL (ref 0.61–1.24)
GFR calc Af Amer: >60 mL/min (ref >60)
GFR calc non Af Amer: >60 mL/min (ref >60)
Glucose, Bld: 120 mg/dL — ABNORMAL HIGH (ref 70–99)
Potassium: 4.1 mmol/L (ref 3.5–5.1)
Sodium: 136 mmol/L (ref 135–145)

## 2019-11-08 LAB — HEPATIC FUNCTION PANEL
ALT: 38 U/L (ref 0–44)
AST: 27 U/L (ref 15–41)
Albumin: 3.8 g/dL (ref 3.5–5.0)
Alkaline Phosphatase: 64 U/L (ref 38–126)
Bilirubin, Direct: 0.2 mg/dL (ref 0.0–0.2)
Indirect Bilirubin: 1.5 mg/dL — ABNORMAL HIGH (ref 0.3–0.9)
Total Bilirubin: 1.7 mg/dL — ABNORMAL HIGH (ref 0.3–1.2)
Total Protein: 6.9 g/dL (ref 6.5–8.1)

## 2019-11-08 LAB — CBC
HCT: 43.3 % (ref 39.0–52.0)
Hemoglobin: 14.1 g/dL (ref 13.0–17.0)
MCH: 26.1 pg (ref 26.0–34.0)
MCHC: 32.6 g/dL (ref 30.0–36.0)
MCV: 80.2 fL (ref 80.0–100.0)
Platelets: 227 10*3/uL (ref 150–400)
RBC: 5.4 MIL/uL (ref 4.22–5.81)
RDW: 15 % (ref 11.5–15.5)
WBC: 8.5 10*3/uL (ref 4.0–10.5)
nRBC: 0 % (ref 0.0–0.2)

## 2019-11-08 LAB — TROPONIN I (HIGH SENSITIVITY)
Troponin I (High Sensitivity): 5 ng/L (ref <18)
Troponin I (High Sensitivity): 5 ng/L (ref <18)

## 2019-11-08 LAB — FIBRIN DERIVATIVES D-DIMER (ARMC ONLY): Fibrin derivatives D-dimer (ARMC): 585.47 ng/mL (FEU) — ABNORMAL HIGH (ref 0.00–499.00)

## 2019-11-08 MED ORDER — METRONIDAZOLE IN NACL 5-0.79 MG/ML-% IV SOLN: 500.0000 mg | Freq: Once | INTRAVENOUS | Status: DC

## 2019-11-08 MED ORDER — ENOXAPARIN SODIUM 40 MG/0.4ML ~~LOC~~ SOLN
40.0000 mg | Freq: Two times a day (BID) | SUBCUTANEOUS | Status: DC
  Administered 2019-11-08 – 2019-11-10 (×3): 40 mg via SUBCUTANEOUS
  Filled 2019-11-08 (×3): qty 0.4

## 2019-11-08 MED ORDER — DILTIAZEM HCL ER COATED BEADS 180 MG PO CP24
180.0000 mg | ORAL_CAPSULE | Freq: Every day | ORAL | Status: DC
  Administered 2019-11-09 – 2019-11-10 (×2): 180 mg via ORAL
  Filled 2019-11-08 (×2): qty 1

## 2019-11-08 MED ORDER — HYDROXYCHLOROQUINE SULFATE 200 MG PO TABS
400.0000 mg | ORAL_TABLET | Freq: Every day | ORAL | Status: DC
  Administered 2019-11-10: 400 mg via ORAL
  Filled 2019-11-08 (×2): qty 2

## 2019-11-08 MED ORDER — CIPROFLOXACIN IN D5W 400 MG/200ML IV SOLN
400.0000 mg | Freq: Two times a day (BID) | INTRAVENOUS | Status: DC
  Administered 2019-11-09 – 2019-11-10 (×4): 400 mg via INTRAVENOUS
  Filled 2019-11-08 (×5): qty 200

## 2019-11-08 MED ORDER — SODIUM CHLORIDE 0.9 % IV SOLN: INTRAVENOUS | Status: DC

## 2019-11-08 MED ORDER — METRONIDAZOLE IN NACL 5-0.79 MG/ML-% IV SOLN
500.0000 mg | Freq: Three times a day (TID) | INTRAVENOUS | Status: DC
  Administered 2019-11-08 – 2019-11-10 (×5): 500 mg via INTRAVENOUS
  Filled 2019-11-08 (×7): qty 100

## 2019-11-08 MED ORDER — ONDANSETRON 4 MG PO TBDP: 4.0000 mg | ORAL_TABLET | Freq: Four times a day (QID) | ORAL | Status: DC | PRN

## 2019-11-08 MED ORDER — HYDROMORPHONE HCL 1 MG/ML IJ SOLN: 0.5000 mg | INTRAMUSCULAR | Status: DC | PRN

## 2019-11-08 MED ORDER — IOHEXOL 350 MG/ML SOLN
100.0000 mL | Freq: Once | INTRAVENOUS | Status: AC | PRN
  Administered 2019-11-08: 100 mL via INTRAVENOUS
  Filled 2019-11-08: qty 100

## 2019-11-08 MED ORDER — MORPHINE SULFATE (PF) 4 MG/ML IV SOLN: 4.0000 mg | INTRAVENOUS | Status: DC | PRN

## 2019-11-08 MED ORDER — HYDROXYZINE HCL 25 MG PO TABS: 50.0000 mg | ORAL_TABLET | Freq: Three times a day (TID) | ORAL | Status: DC | PRN

## 2019-11-08 MED ORDER — PANTOPRAZOLE SODIUM 40 MG IV SOLR
40.0000 mg | Freq: Every day | INTRAVENOUS | Status: DC
  Administered 2019-11-08 – 2019-11-09 (×2): 40 mg via INTRAVENOUS
  Filled 2019-11-08 (×2): qty 40

## 2019-11-08 MED ORDER — INDOCYANINE GREEN 25 MG IV SOLR
1.2500 mg | Freq: Once | INTRAVENOUS | Status: AC
  Administered 2019-11-09: 13:00:00 1.25 mg via INTRAVENOUS
  Filled 2019-11-08: qty 10

## 2019-11-08 MED ORDER — ACETAMINOPHEN 650 MG RE SUPP: 650.0000 mg | Freq: Four times a day (QID) | RECTAL | Status: DC | PRN

## 2019-11-08 MED ORDER — ACETAMINOPHEN 325 MG PO TABS
650.0000 mg | ORAL_TABLET | Freq: Four times a day (QID) | ORAL | Status: DC | PRN
  Administered 2019-11-09: 650 mg via ORAL
  Filled 2019-11-08: qty 2

## 2019-11-08 MED ORDER — HYDROCODONE-ACETAMINOPHEN 5-325 MG PO TABS
1.0000 | ORAL_TABLET | ORAL | Status: DC | PRN
  Administered 2019-11-09 – 2019-11-10 (×2): 1 via ORAL
  Filled 2019-11-08 (×2): qty 1

## 2019-11-08 MED ORDER — KETOROLAC TROMETHAMINE 30 MG/ML IJ SOLN: 15.0000 mg | Freq: Once | INTRAMUSCULAR | Status: DC

## 2019-11-08 MED ORDER — ALUM & MAG HYDROXIDE-SIMETH 200-200-20 MG/5ML PO SUSP
30.0000 mL | Freq: Once | ORAL | Status: AC
  Administered 2019-11-08: 30 mL via ORAL
  Filled 2019-11-08: qty 30

## 2019-11-08 MED ORDER — KETOROLAC TROMETHAMINE 30 MG/ML IJ SOLN
30.0000 mg | Freq: Once | INTRAMUSCULAR | Status: AC
  Administered 2019-11-08: 30 mg via INTRAVENOUS
  Filled 2019-11-08: qty 1

## 2019-11-08 MED ORDER — ONDANSETRON HCL 4 MG/2ML IJ SOLN
4.0000 mg | Freq: Four times a day (QID) | INTRAMUSCULAR | Status: DC | PRN
  Administered 2019-11-09: 17:00:00 4 mg via INTRAVENOUS

## 2019-11-08 MED ORDER — DOXYCYCLINE HYCLATE 100 MG PO TABS: 100.0000 mg | ORAL_TABLET | Freq: Two times a day (BID) | ORAL | Status: DC

## 2019-11-08 MED ORDER — CIPROFLOXACIN IN D5W 400 MG/200ML IV SOLN: 400.0000 mg | Freq: Once | INTRAVENOUS | Status: DC

## 2019-11-08 MED ORDER — MORPHINE SULFATE (PF) 4 MG/ML IV SOLN
4.0000 mg | INTRAVENOUS | Status: DC | PRN
  Administered 2019-11-08: 4 mg via INTRAVENOUS
  Filled 2019-11-08: qty 1

## 2019-11-08 MED ORDER — FOLIC ACID 1 MG PO TABS
1.0000 mg | ORAL_TABLET | Freq: Every day | ORAL | Status: DC
  Administered 2019-11-10: 08:00:00 1 mg via ORAL
  Filled 2019-11-08: qty 1

## 2019-11-08 MED ORDER — ONDANSETRON HCL 4 MG/2ML IJ SOLN
4.0000 mg | Freq: Once | INTRAMUSCULAR | Status: AC
  Administered 2019-11-08: 18:00:00 4 mg via INTRAVENOUS
  Filled 2019-11-08: qty 2

## 2019-11-08 MED ORDER — LIDOCAINE VISCOUS HCL 2 % MT SOLN
15.0000 mL | Freq: Once | OROMUCOSAL | Status: AC
  Administered 2019-11-08: 15 mL via ORAL
  Filled 2019-11-08: qty 15

## 2019-11-08 NOTE — Progress Notes (Addendum)
I have reviewed the triage vital signs and the nursing notes.   HISTORY  Chief Complaint No chief complaint on file.   HPI Jeremy West is a 54 y.o. male presents to the clinic with complaint of anterior chest wall pain that began while working.  Patient states that he had a ham and cheese sub for lunch and "feels like it stuck".  He describes chest pain with pressure.  He denies any radiation into his upper extremities.  He has nausea without vomiting.  He is unaware of any indigestion, diaphoresis and denies shortness of breath.  Patient does have a history of reflux.  Patient does have a history of atrial fib.  He rates his pain as a 10/10.      Past Medical History:  Diagnosis Date  . Dilated aortic root ([place])    a. 04/2017 Echo: Mod dil Ao root @ 44 mm.  Marland Kitchen GERD (gastroesophageal reflux disease)   . History of echocardiogram    a. 04/2017 Echo: EF 55-60%, Mod dil Ao root @ 44 mm.  mildly dil LA.  Marland Kitchen Hypertension   . Morbid obesity ([place])   . OA (osteoarthritis)   . PAF (paroxysmal atrial fibrillation) ([place])    a. Seen in 01/2010; b. CHA2DS2VASc = 1.  . Rheumatoid arthritis ([place])   . Sleep apnea   . Tubular adenoma of colon 03/12/2015  . Villous adenoma of colon 03/12/2015    Patient Active Problem List   Diagnosis Date Noted  . Gastroesophageal reflux disease 03/10/2019  . Cough 03/10/2019  . Dilated aortic root ([place]) 03/10/2019  . IFG (impaired fasting glucose) 03/10/2019  . History of colonic polyps 03/10/2019  . Chest pain 05/22/2017  . Shortness of breath 05/22/2017  . Atrial fibrillation ([place]) 10/31/2013  . Sleep apnea 10/31/2013  . Obesity 10/31/2013  . Hypertension 10/31/2013  . Seropositive rheumatoid arthritis ([place]) 10/31/2013  . Abnormal transaminases 10/31/2013    Past Surgical History:  Procedure Laterality Date  . ARTHROTOMY N/A   . COLONOSCOPY WITH PROPOFOL N/A 03/12/2015   Procedure: COLONOSCOPY WITH PROPOFOL;  Surgeon: Manya Silvas,  MD;  Location: [facility] ENDOSCOPY;  Service: Endoscopy;  Laterality: N/A;  . complex repair leg Right   . ESOPHAGOGASTRODUODENOSCOPY (EGD) WITH PROPOFOL N/A 03/12/2015   Procedure: ESOPHAGOGASTRODUODENOSCOPY (EGD) WITH PROPOFOL;  Surgeon: Manya Silvas, MD;  Location[facility] ENDOSCOPY;  Service: Endoscopy;  Laterality: N/A;    Prior to Admission medications   Medication Sig Start Date End Date Taking? Authorizing Provider  Abatacept 125 MG/ML SOAJ Inject 125 mg into the skin once a week.   Yes [provider]  acetaminophen (TYLENOL) 650 MG CR tablet Take 650 mg by mouth every 8 (eight) hours as needed for pain.   Yes [provider]  diclofenac sodium (VOLTAREN) 1 % GEL Apply 4 g topically 4 (four) times daily.   Yes [provider]  diltiazem (CARDIZEM CD) 180 MG 24 hr capsule Take 1 capsule (180 mg total) by mouth daily. 09/23/18  Yes Gollan, Kathlene November, MD  doxycycline (MONODOX) 100 MG capsule Take 1 capsule (100 mg total) by mouth 2 (two) times daily. 10/27/19  Yes Sable Feil, PA-C  famotidine (PEPCID) 20 MG tablet Take 20 mg by mouth 2 (two) times daily.   Yes [provider]  folic acid (FOLVITE) 1 MG tablet Take 1 mg by mouth daily.   Yes [provider]  hydroxychloroquine (PLAQUENIL) 200 MG tablet Take 400 mg by  mouth daily.  02/05/17  Yes [provider]  hydrOXYzine (ATARAX/VISTARIL) 50 MG tablet Take 1 tablet (50 mg total) by mouth 3 (three) times daily as needed for itching. 10/27/19  Yes Sable Feil, PA-C  methotrexate (50 MG/ML) 1 g injection 0.8 mLs once a week.   Yes [provider]  methylPREDNISolone (MEDROL DOSEPAK) 4 MG TBPK tablet Take Tapered dose as directed 10/27/19   Sable Feil, PA-C  mupirocin cream (BACTROBAN) 2 % Apply 1 application topically 2 (two) times daily. 05/31/19   Sable Feil, PA-C    Allergies Penicillins and Adalimumab  Family History  Problem Relation Age of Onset  . Bone cancer  Mother        died in his 71s  . Lung cancer Father        died in his 35s  . Thyroid cancer Sister     Social History Social History   Tobacco Use  . Smoking status: Never Smoker  . Smokeless tobacco: Former Network engineer Use Topics  . Alcohol use: Yes    Alcohol/week: 1.0 standard drinks    Types: 1 Cans of beer per week    Comment: rare  . Drug use: No    Review of Systems Constitutional: No fever/chills Eyes: No visual changes. ENT: No sore throat. Cardiovascular: Positive chest pain. Respiratory: Denies shortness of breath. Gastrointestinal: No abdominal pain.  Positive nausea, no vomiting.  Musculoskeletal: Negative for muscle aches. Skin: Negative for rash. Neurological: Negative for headaches, focal weakness or numbness.  ____________________________________________   PHYSICAL EXAM: Constitutional: Alert and oriented. Well appearing and in no acute distress.  Appears to be very uncomfortable and continuously has his hand across the anterior portion of his chest rubbing it and complaining of the pressure. Eyes: Conjunctivae are normal.  Head: Atraumatic. Neck: No stridor.  Cardiovascular: Normal rate, regular rhythm. Grossly normal heart sounds.  Good peripheral circulation. Respiratory: Normal respiratory effort.  No retractions. Lungs CTAB. Gastrointestinal: Soft and nontender.  Specifically no epigastric tenderness on palpation.  Bowel sounds are normoactive. Neurologic:  Normal speech and language. No gross focal neurologic deficits are appreciated. No gait instability. Skin:  Skin is warm, dry and intact.  Initially patient was noted to be pale without clamminess. Psychiatric: Mood and affect are normal. Speech and behavior are normal.    FINAL CLINICAL IMPRESSION(S)   54 year old male presents to clinic with complaint of sudden onset anterior chest wall pain with pressure.  Patient reports nausea without vomiting.  No radiation into the upper  extremity.  He does have a history of both reflux and atrial fib.  Patient initially rated his pain as a 10/10 and initial blood pressure was 167/97.  Patient was given 1 325mg  aspirin and monitored.  Patient was noted to burp multiple times but continued to rub his anterior chest stating that it was still hurting and he still had pressure.  He did not want to go to the emergency department for further evaluation by EMS and was monitored until his sister picked him up.  Patient states prior to leaving that he is feeling much better and that his pain is down to a 3.  Patient was escorted to sister's vehicle for further testing at [facility]     ED Discharge Orders    None       Note:  This document was prepared using Dragon voice recognition software and may include unintentional dictation errors.

## 2019-11-08 NOTE — ED Triage Notes (Signed)
Patient states after he ate lunch, he felt soreness in his chest that began around 11:30am.  Patient states he went to his PCP who wanted him to come to the ED.  Patient states it is common that food, "gets stuck" in his chest, and he has to vomit.  Patient denies any procedures or testing around this issue.  Patient reports history of afib and sleep apnea.  Patient is in no obvious distress at this time.

## 2019-11-08 NOTE — H&P (Signed)
SURGICAL CONSULTATION NOTE   HISTORY OF PRESENT ILLNESS (HPI):  54 y.o. male presented to [facility] ED for evaluation of abdominal pain. Patient reports started with abdominal pain since today at 11:30 AM.  He was having lunch when he started having epigastric pain.  Patient reports the pains radiate to the right upper quadrant.  Aggravating factor was lunch.  There has been no alleviating factor despite having morphine, Dilaudid, Maalox among others at the ED to try to control the pain.  Denies nausea or vomiting.  Denies fever chills.  He came to the ED for evaluation of this pain.  At the ED he had ultrasound that shows a big stone impacted in the neck of the gallbladder.  There is no wall thickening or pericholecystic fluid.  I personally evaluated the images.  Patient without leukocytosis.  Elevated bilirubin but mostly indirect.  Surgery is consulted by Dr. Quentin Cornwall in this context for evaluation and management of intractable biliary colic.  PAST MEDICAL HISTORY (PMH):  Past Medical History:  Diagnosis Date  . Dilated aortic root ([place])    a. 04/2017 Echo: Mod dil Ao root @ 44 mm.  Marland Kitchen GERD (gastroesophageal reflux disease)   . History of echocardiogram    a. 04/2017 Echo: EF 55-60%, Mod dil Ao root @ 44 mm.  mildly dil LA.  Marland Kitchen Hypertension   . Morbid obesity ([place])   . OA (osteoarthritis)   . PAF (paroxysmal atrial fibrillation) ([place])    a. Seen in 01/2010; b. CHA2DS2VASc = 1.  . Rheumatoid arthritis ([place])   . Sleep apnea   . Tubular adenoma of colon 03/12/2015  . Villous adenoma of colon 03/12/2015     PAST SURGICAL HISTORY [facility]):  Past Surgical History:  Procedure Laterality Date  . ARTHROTOMY N/A   . COLONOSCOPY WITH PROPOFOL N/A 03/12/2015   Procedure: COLONOSCOPY WITH PROPOFOL;  Surgeon: Manya Silvas, MD;  Location: [facility] ENDOSCOPY;  Service: Endoscopy;  Laterality: N/A;  . complex repair leg Right   . ESOPHAGOGASTRODUODENOSCOPY (EGD) WITH PROPOFOL N/A 03/12/2015   Procedure:  ESOPHAGOGASTRODUODENOSCOPY (EGD) WITH PROPOFOL;  Surgeon: Manya Silvas, MD;  Location: [facility] ENDOSCOPY;  Service: Endoscopy;  Laterality: N/A;     MEDICATIONS:  Prior to Admission medications   Medication Sig Start Date End Date Taking? Authorizing Provider  Abatacept 125 MG/ML SOAJ Inject 125 mg into the skin once a week.    [provider]  acetaminophen (TYLENOL) 650 MG CR tablet Take 650 mg by mouth every 8 (eight) hours as needed for pain.    [provider]  diclofenac sodium (VOLTAREN) 1 % GEL Apply 4 g topically 4 (four) times daily.    [provider]  diltiazem (CARDIZEM CD) 180 MG 24 hr capsule Take 1 capsule (180 mg total) by mouth daily. 09/23/18   Minna Merritts, MD  doxycycline (MONODOX) 100 MG capsule Take 1 capsule (100 mg total) by mouth 2 (two) times daily. 10/27/19   Sable Feil, PA-C  famotidine (PEPCID) 20 MG tablet Take 20 mg by mouth 2 (two) times daily.    [provider]  folic acid (FOLVITE) 1 MG tablet Take 1 mg by mouth daily.    [provider]  hydroxychloroquine (PLAQUENIL) 200 MG tablet Take 400 mg by mouth daily.  02/05/17   [provider]  hydrOXYzine (ATARAX/VISTARIL) 50 MG tablet Take 1 tablet (50 mg total) by mouth 3 (three) times daily as needed for itching. 10/27/19   Sable Feil,  PA-C  methotrexate (50 MG/ML) 1 g injection 0.8 mLs once a week.    [provider]     ALLERGIES:  Allergies  Allergen Reactions  . Penicillins Anaphylaxis    Childhood reaction Has patient had a PCN reaction causing immediate rash, facial/tongue/throat swelling, SOB or lightheadedness with hypotension: Unknown Has patient had a PCN reaction causing severe rash involving mucus membranes or skin necrosis: Unknown Has patient had a PCN reaction that required hospitalization: Unknown Has patient had a PCN reaction occurring within the last 10 years: No If all of the above answers are "NO", then may proceed  with Cephalosporin use.   . Adalimumab      SOCIAL HISTORY:  Social History   Socioeconomic History  . Marital status: Single    Spouse name: Not on file  . Number of children: Not on file  . Years of education: Not on file  . 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  Tobacco Use  . Smoking status: Never Smoker  . Smokeless tobacco: Former Network engineer and Sexual Activity  . Alcohol use: Yes    Alcohol/week: 1.0 standard drinks    Types: 1 Cans of beer per week    Comment: rare  . Drug use: No  . Sexual activity: Not on file  Other Topics Concern  . Not on file  Social History Narrative   Lives in [place] by himself.  Was walking regularly about a year ago and lost 30 lbs, but has since changed jobs and has gained back weight.   Social Determinants of Health   Financial Resource Strain:   . Difficulty of Paying Living Expenses:   Food Insecurity:   . Worried About Charity fundraiser in the Last Year:   . Arboriculturist in the Last Year:   Transportation Needs:   . Film/video editor (Medical):   Marland Kitchen Lack of Transportation (Non-Medical):   Physical Activity:   . Days of Exercise per Week:   . Minutes of Exercise per Session:   Stress:   . Feeling of Stress :   Social Connections:   . Frequency of Communication with Friends and Family:   . Frequency of Social Gatherings with Friends and Family:   . Attends Religious Services:   . Active Member of Clubs or Organizations:   . Attends Archivist Meetings:   Marland Kitchen Marital Status:   Intimate Partner Violence:   . Fear of Current or Ex-Partner:   . Emotionally Abused:   Marland Kitchen Physically Abused:   . Sexually Abused:      FAMILY HISTORY:  Family History  Problem Relation Age of Onset  . Bone cancer Mother        died in his 66s  . Lung cancer Father        died in his 44s  . Thyroid cancer Sister       REVIEW OF SYSTEMS:  Constitutional: denies weight loss, fever, chills, or sweats  Eyes: denies any other vision changes, history of eye injury  ENT: denies sore throat, hearing problems  Respiratory: denies shortness of breath, wheezing  Cardiovascular: denies chest pain, palpitations  Gastrointestinal: positive abdominal pain, Denies nausea and vomitnig Genitourinary: denies burning with urination or urinary frequency Musculoskeletal: denies any other joint pains or cramps  Skin: denies any other rashes or skin discolorations  Neurological: denies any other headache, dizziness, weakness  Psychiatric: denies any other depression, anxiety   All other review of systems were negative   VITAL SIGNS:  Temp:  [98 F (36.7 C)-98.8 F (37.1 C)] 98.8 F (37.1 C) (04/20 1412) Pulse Rate:  [84-89] 84 (04/20 1412) Resp:  [18] 18 (04/20 1412) BP: (110-162)/(58-97) 135/83 (04/20 1412) SpO2:  [98 %] 98 % (04/20 1412) Weight:  [160.1 kg] 160.1 kg (04/20 1412)     Height: 6' (182.9 cm) Weight: (!) 160.1 kg BMI (Calculated): 47.87   INTAKE/OUTPUT:  This shift: No intake/output data recorded.  Last 2 shifts: @IOLAST2SHIFTS @   PHYSICAL EXAM:  Constitutional:  -- Normal body habitus  -- wake, alert, and oriented x3  Eyes:  -- Pupils equally round and reactive to light  -- No scleral icterus  Ear, nose, and throat:  -- No jugular venous distension  Pulmonary:  -- No crackles  -- Equal breath sounds bilaterally -- Breathing non-labored at rest Cardiovascular:  -- S1, S2 present  -- No pericardial rubs Gastrointestinal:  -- Abdomen soft, tender to palpation in the right upper quadrant, non-distended, no guarding or rebound tenderness -- No abdominal masses appreciated, pulsatile or otherwise  Musculoskeletal and Integumentary:  -- Wounds: None appreciated -- Extremities: B/L UE and LE FROM, hands and feet warm, no edema  Neurologic:  -- Motor function: intact and symmetric --  Sensation: intact and symmetric   Labs:  CBC Latest Ref Rng & Units 11/08/2019 09/20/2019 02/23/2019  WBC 4.0 - 10.5 K/uL 8.5 5.8 6.1  Hemoglobin 13.0 - 17.0 g/dL 14.1 14.7 14.9  Hematocrit 39.0 - 52.0 % 43.3 42.3 44.8  Platelets 150 - 400 K/uL 227 241 242   CMP Latest Ref Rng & Units 11/08/2019 09/20/2019 02/23/2019  Glucose 70 - 99 mg/dL 120(H) 147(H) 101(H)  BUN 6 - 20 mg/dL 10 8 10   Creatinine 0.61 - 1.24 mg/dL 1.09 0.91 0.97  Sodium 135 - 145 mmol/L 136 137 139  Potassium 3.5 - 5.1 mmol/L 4.1 4.4 4.5  Chloride 98 - 111 mmol/L 103 104 102  CO2 22 - 32 mmol/L 26 19(L) -  Calcium 8.9 - 10.3 mg/dL 9.1 9.0 9.4  Total Protein 6.5 - 8.1 g/dL 6.9 6.5 6.7  Total Bilirubin 0.3 - 1.2 mg/dL 1.7(H) 1.2 1.3(H)  Alkaline Phos 38 - 126 U/L 64 79 77  AST 15 - 41 U/L 27 43(H) 24  ALT 0 - 44 U/L 38 54(H) 25    Imaging studies:  EXAM: ULTRASOUND ABDOMEN LIMITED RIGHT UPPER QUADRANT  COMPARISON:  None.  FINDINGS: Gallbladder:  Shadowing stone measuring 2.1 cm. Normal wall thickness. Negative sonographic Murphy.  Common bile duct:  Diameter: 5.6 mm  Liver:  Liver is echogenic. No focal hepatic abnormality. Portal vein is patent on color Doppler imaging with normal direction of blood flow towards the liver.  Other: None.  IMPRESSION: 1. Cholelithiasis without sonographic evidence for acute cholecystitis or biliary dilatation 2. Echogenic liver consistent with steatosis.   Electronically Signed   By: Donavan Foil M.D.   On: 11/08/2019 19:19  Assessment/Plan:  54 y.o. male with acute cholecystitis, complicated by pertinent comorbidities including rheumatoid arthritis.  Patient with history, physical exam and images consistent with acute cholecystitis. Patient oriented about diagnosis and surgical management as treatment.  The fact that the stone is in the neck and the pain has not resolved it is consistent with acute cholecystitis even though he does not show gallbladder  wall thickening or pericholecystic  fluid.  We will admit patient and and start on IV antibiotic therapy and IV fluids with pain control and plan to proceed with cholecystectomy tomorrow.  Discussed the risk of surgery including post-op infxn, seroma, biloma, chronic pain, poor-delayed wound healing, retained gallstone, conversion to open procedure, post-op SBO or ileus, and need for additional procedures to address said risks.  The risks of general anesthetic including MI, CVA, sudden death or even reaction to anesthetic medications also discussed. Alternatives include continued observation.  Benefits include possible symptom relief, prevention of complications including acute cholecystitis, pancreatitis.  Arnold Long, MD

## 2019-11-08 NOTE — Progress Notes (Signed)
Assumed care of patient at this time. Patient alert oriented and pleasant. Patient voices a tolerate pain level at this time. Patient started on IV fluids and Antibiotics started. Consent has been signed and CHG bath completed this evening. No acute finding noted at this time. Safety checks completed and call light placed within reach.

## 2019-11-08 NOTE — ED Provider Notes (Signed)
[facility] Emergency Department Provider Note    First MD Initiated Contact with Patient 11/08/19 1510     (approximate)  I have reviewed the triage vital signs and the nursing notes.   HISTORY  Chief Complaint Chest Pain    HPI LINZY LADEHOFF is a 54 y.o. male close past medical history presents to the ER for evaluation of chest pain or pressure that started around 1130 after the patient was eating a ham sandwich.  States he does have a history of reflux and has had food get stuck in his throat but will frequently vomit after that and clear.  Did not feel the same did not feel like he was unable to pass food.  He was able to drink Pepsi after but is still having persistent discomfort.  No diaphoresis.  Does have some discomfort with taking deep inspiration.  No measured fevers.  No cough or congestion.    Past Medical History:  Diagnosis Date  . Dilated aortic root ([place])    a. 04/2017 Echo: Mod dil Ao root @ 44 mm.  Marland Kitchen GERD (gastroesophageal reflux disease)   . History of echocardiogram    a. 04/2017 Echo: EF 55-60%, Mod dil Ao root @ 44 mm.  mildly dil LA.  Marland Kitchen Hypertension   . Morbid obesity ([place])   . OA (osteoarthritis)   . PAF (paroxysmal atrial fibrillation) ([place])    a. Seen in 01/2010; b. CHA2DS2VASc = 1.  . Rheumatoid arthritis ([place])   . Sleep apnea   . Tubular adenoma of colon 03/12/2015  . Villous adenoma of colon 03/12/2015   Family History  Problem Relation Age of Onset  . Bone cancer Mother        died in his 29s  . Lung cancer Father        died in his 62s  . Thyroid cancer Sister    Past Surgical History:  Procedure Laterality Date  . ARTHROTOMY N/A   . COLONOSCOPY WITH PROPOFOL N/A 03/12/2015   Procedure: COLONOSCOPY WITH PROPOFOL;  Surgeon: Manya Silvas, MD;  Location: [facility] ENDOSCOPY;  Service: Endoscopy;  Laterality: N/A;  . complex repair leg Right   . ESOPHAGOGASTRODUODENOSCOPY (EGD) WITH PROPOFOL N/A 03/12/2015   Procedure: ESOPHAGOGASTRODUODENOSCOPY (EGD) WITH PROPOFOL;  Surgeon: Manya Silvas, MD;  Location: [facility] ENDOSCOPY;  Service: Endoscopy;  Laterality: N/A;   Patient Active Problem List   Diagnosis Date Noted  . Gastroesophageal reflux disease 03/10/2019  . Cough 03/10/2019  . Dilated aortic root ([place]) 03/10/2019  . IFG (impaired fasting glucose) 03/10/2019  . History of colonic polyps 03/10/2019  . Chest pain 05/22/2017  . Shortness of breath 05/22/2017  . Atrial fibrillation ([place]) 10/31/2013  . Sleep apnea 10/31/2013  . Obesity 10/31/2013  . Hypertension 10/31/2013  . Seropositive rheumatoid arthritis ([place]) 10/31/2013  . Abnormal transaminases 10/31/2013      Prior to Admission medications   Medication Sig Start Date End Date Taking? Authorizing Provider  Abatacept 125 MG/ML SOAJ Inject 125 mg into the skin once a week.    [provider]  acetaminophen (TYLENOL) 650 MG CR tablet Take 650 mg by mouth every 8 (eight) hours as needed for pain.    [provider]  diclofenac sodium (VOLTAREN) 1 % GEL Apply 4 g topically 4 (four) times daily.    [provider]  diltiazem (CARDIZEM CD) 180 MG 24 hr capsule Take 1 capsule (180 mg total) by mouth daily. 09/23/18   Gollan,  Kathlene November, MD  doxycycline (MONODOX) 100 MG capsule Take 1 capsule (100 mg total) by mouth 2 (two) times daily. 10/27/19   Sable Feil, PA-C  famotidine (PEPCID) 20 MG tablet Take 20 mg by mouth 2 (two) times daily.    [provider]  folic acid (FOLVITE) 1 MG tablet Take 1 mg by mouth daily.    [provider]  hydroxychloroquine (PLAQUENIL) 200 MG tablet Take 400 mg by mouth daily.  02/05/17   [provider]  hydrOXYzine (ATARAX/VISTARIL) 50 MG tablet Take 1 tablet (50 mg total) by mouth 3 (three) times daily as needed for itching. 10/27/19   Sable Feil, PA-C  methotrexate (50 MG/ML) 1 g injection 0.8 mLs once a week.    [provider]     Allergies Penicillins and Adalimumab    Social History Social History   Tobacco Use  . Smoking status: Never Smoker  . Smokeless tobacco: Former Network engineer Use Topics  . Alcohol use: Yes    Alcohol/week: 1.0 standard drinks    Types: 1 Cans of beer per week    Comment: rare  . Drug use: No    Review of Systems Patient denies headaches, rhinorrhea, blurry vision, numbness, shortness of breath, chest pain, edema, cough, abdominal pain, nausea, vomiting, diarrhea, dysuria, fevers, rashes or hallucinations unless otherwise stated above in HPI. ____________________________________________   PHYSICAL EXAM:  VITAL SIGNS: Vitals:   11/08/19 1412  BP: 135/83  Pulse: 84  Resp: 18  Temp: 98.8 F (37.1 C)  SpO2: 98%    Constitutional: Alert and oriented.  Eyes: Conjunctivae are normal.  Head: Atraumatic. Nose: No congestion/rhinnorhea. Mouth/Throat: Mucous membranes are moist.   Neck: No stridor. Painless ROM.  Cardiovascular: Normal rate, regular rhythm. Grossly normal heart sounds.  Good peripheral circulation. Respiratory: Normal respiratory effort.  No retractions. Lungs CTAB. Gastrointestinal: Soft ttp in RUQ. No distention. No abdominal bruits. No CVA tenderness. Genitourinary:  Musculoskeletal: No lower extremity tenderness nor edema.  No joint effusions. Neurologic:  Normal speech and language. No gross focal neurologic deficits are appreciated. No facial droop Skin:  Skin is warm, dry and intact. No rash noted. Psychiatric: Mood and affect are normal. Speech and behavior are normal.  ____________________________________________   LABS (all labs ordered are listed, but only abnormal results are displayed)  Results for orders placed or performed during the hospital encounter of 11/08/19 (from the past 24 hour(s))  Basic metabolic panel     Status: Abnormal   Collection Time: 11/08/19  2:14 PM  Result Value Ref Range   Sodium 136 135 - 145 mmol/L    Potassium 4.1 3.5 - 5.1 mmol/L   Chloride 103 98 - 111 mmol/L   CO2 26 22 - 32 mmol/L   Glucose, Bld 120 (H) 70 - 99 mg/dL   BUN 10 6 - 20 mg/dL   Creatinine, Ser 1.09 0.61 - 1.24 mg/dL   Calcium 9.1 8.9 - 10.3 mg/dL   GFR calc non Af Amer >60 >60 mL/min   GFR calc Af Amer >60 >60 mL/min   Anion gap 7 5 - 15  CBC     Status: None   Collection Time: 11/08/19  2:14 PM  Result Value Ref Range   WBC 8.5 4.0 - 10.5 K/uL   RBC 5.40 4.22 - 5.81 MIL/uL   Hemoglobin 14.1 13.0 - 17.0 g/dL   HCT 43.3 39.0 - 52.0 %   MCV 80.2 80.0 - 100.0 fL  MCH 26.1 26.0 - 34.0 pg   MCHC 32.6 30.0 - 36.0 g/dL   RDW 15.0 11.5 - 15.5 %   Platelets 227 150 - 400 K/uL   nRBC 0.0 0.0 - 0.2 %  Troponin I (High Sensitivity)     Status: None   Collection Time: 11/08/19  2:14 PM  Result Value Ref Range   Troponin I (High Sensitivity) 5 <18 ng/L  Fibrin derivatives D-Dimer ([facility] only)     Status: Abnormal   Collection Time: 11/08/19  3:47 PM  Result Value Ref Range   Fibrin derivatives D-dimer ([facility]) 585.47 (H) 0.00 - 499.00 ng/mL (FEU)  Troponin I (High Sensitivity)     Status: None   Collection Time: 11/08/19  4:14 PM  Result Value Ref Range   Troponin I (High Sensitivity) 5 <18 ng/L  Hepatic function panel     Status: Abnormal   Collection Time: 11/08/19  4:14 PM  Result Value Ref Range   Total Protein 6.9 6.5 - 8.1 g/dL   Albumin 3.8 3.5 - 5.0 g/dL   AST 27 15 - 41 U/L   ALT 38 0 - 44 U/L   Alkaline Phosphatase 64 38 - 126 U/L   Total Bilirubin 1.7 (H) 0.3 - 1.2 mg/dL   Bilirubin, Direct 0.2 0.0 - 0.2 mg/dL   Indirect Bilirubin 1.5 (H) 0.3 - 0.9 mg/dL   ____________________________________________  EKG My review and personal interpretation at Time: 14:06   Indication: chest pain  Rate: 80  Rhythm: sinus Axis: normal Other: normal intervals, no stemi or depressions ____________________________________________  RADIOLOGY  I personally reviewed all radiographic images ordered to evaluate for  the above acute complaints and reviewed radiology reports and findings.  These findings were personally discussed with the patient.  Please see medical record for radiology report.  ____________________________________________   PROCEDURES  Procedure(s) performed:  Procedures    Critical Care performed: no ____________________________________________   INITIAL IMPRESSION / ASSESSMENT AND PLAN / ED COURSE  Pertinent labs & imaging results that were available during my care of the patient were reviewed by me and considered in my medical decision making (see chart for details).   DDX: ACS, esophagitis, gastritis, PE, cholelithiasis, pancreatitis, cholecystitis  ETHEN GIRTY is a 54 y.o. who presents to the ED with symptoms as described above.  Patient nontoxic-appearing but is having some discomfort.  States that most of his symptoms are thoracic he does have a little bit of epigastric pain.  EKG is nonischemic.  Troponin is normal.  No sign of dysrhythmia.  Will give pain medication and reassess.  Does not seem consistent with food bolus impaction.  Clinical Course as of Nov 08 2026  Tue Nov 08, 2019  1647 D-dimer is elevated.  Will order CT angiogram to further evaluate.   [PR]  [id number] CT angiogram does not show any clear evidence of PE.  He is not hypoxic.  He is now complaining of more right upper quadrant pain.  His CT does show evidence of cholelithiasis.  Did feel improvement after GI cocktail the pain is now resolved.  Does not seem consistent with ACS given serial enzymes being negative.  Will order right upper quadrant ultrasound to further evaluate.   [PR]  1939 Patient with large gallstone. He is still having some tenderness in right upper quadrant. Is not showing any signs of sepsis or acute cholecystitis by discussed the case in consultation with Dr. Peyton Najjar. I do suspect he is having symptomatic cholelithiasis therefore the  plan will be to try additional dose of IV  Toradol to see if that is able to control his pain. If he is able to be pain-free will be appropriate for elective management and follow-up in clinic. If he is still having pain we will plan admission.   [PR]  2024 Patient still having pain despite additional IV analgesia.  I discussed the case again with Dr. Peyton Najjar who, agrees admit patient.  Will give Cipro Flagyl for IV antibiotics.  Will give additional IV pain medication.   [PR]    Clinical Course User Index [PR] Merlyn Lot, MD    The patient was evaluated in Emergency Department today for the symptoms described in the history of present illness. He/she was evaluated in the context of the global COVID-19 pandemic, which necessitated consideration that the patient might be at risk for infection with the SARS-CoV-2 virus that causes COVID-19. Institutional protocols and algorithms that pertain to the evaluation of patients at risk for COVID-19 are in a state of rapid change based on information released by regulatory bodies including the CDC and federal and state organizations. These policies and algorithms were followed during the patient's care in the ED.  As part of my medical decision making, I reviewed the following data within the [place] notes reviewed and incorporated, Labs reviewed, notes from prior ED visits and [place] Controlled Substance Database   ____________________________________________   FINAL CLINICAL IMPRESSION(S) / ED DIAGNOSES  Final diagnoses:  RUQ abdominal pain  Calculus of gallbladder without cholecystitis without obstruction      NEW MEDICATIONS STARTED DURING THIS VISIT:  New Prescriptions   No medications on file     Note:  This document was prepared using Dragon voice recognition software and may include unintentional dictation errors.    Merlyn Lot, MD 11/08/19 2028

## 2019-11-08 NOTE — Progress Notes (Signed)
Presents to clinic requesting BP check & C/O chest pain.  Denies radiation.  Rates pain a 10 along with pressure  ASA 325 mg x1 given while in clinic.  After about 30 minutes reports decrease in chest pressure & that pain down to a 3.  Escorted down to 1st floor by S. Stone, RN to sister awaiting to take him to [facility] ED for evaluation  AMD

## 2019-11-09 ENCOUNTER — Observation Stay: Payer: 59 | Admitting: Anesthesiology

## 2019-11-09 ENCOUNTER — Encounter: Payer: Self-pay | Admitting: General Surgery

## 2019-11-09 ENCOUNTER — Encounter
Admission: EM | Disposition: A | Discharge status: Home / Self Care | Payer: Self-pay | Source: Home / Self Care | Attending: Student in an Organized Health Care Education/Training Program

## 2019-11-09 LAB — SARS CORONAVIRUS 2 (TAT 6-24 HRS): SARS Coronavirus 2: NEGATIVE

## 2019-11-09 LAB — HIV ANTIBODY (ROUTINE TESTING W REFLEX): HIV Screen 4th Generation wRfx: NONREACTIVE

## 2019-11-09 SURGERY — CHOLECYSTECTOMY, ROBOT-ASSISTED, LAPAROSCOPIC
Anesthesia: General | Site: Abdomen

## 2019-11-09 MED ORDER — SUCCINYLCHOLINE CHLORIDE 200 MG/10ML IV SOSY
PREFILLED_SYRINGE | INTRAVENOUS | Status: AC
  Filled 2019-11-09: qty 10

## 2019-11-09 MED ORDER — FENTANYL CITRATE (PF) 100 MCG/2ML IJ SOLN: 25.0000 ug | INTRAMUSCULAR | Status: DC | PRN

## 2019-11-09 MED ORDER — SUGAMMADEX SODIUM 500 MG/5ML IV SOLN
INTRAVENOUS | Status: DC | PRN
  Administered 2019-11-09: 320 mg via INTRAVENOUS

## 2019-11-09 MED ORDER — FENTANYL CITRATE (PF) 100 MCG/2ML IJ SOLN
INTRAMUSCULAR | Status: DC | PRN
  Administered 2019-11-09 (×2): 50 ug via INTRAVENOUS

## 2019-11-09 MED ORDER — MIDAZOLAM HCL 2 MG/2ML IJ SOLN
INTRAMUSCULAR | Status: AC
  Filled 2019-11-09: qty 2

## 2019-11-09 MED ORDER — SUCCINYLCHOLINE CHLORIDE 20 MG/ML IJ SOLN
INTRAMUSCULAR | Status: DC | PRN
  Administered 2019-11-09: 200 mg via INTRAVENOUS

## 2019-11-09 MED ORDER — ONDANSETRON HCL 4 MG/2ML IJ SOLN: 4.0000 mg | Freq: Once | INTRAMUSCULAR | Status: DC | PRN

## 2019-11-09 MED ORDER — EPINEPHRINE PF 1 MG/ML IJ SOLN
INTRAMUSCULAR | Status: AC
  Filled 2019-11-09: qty 1

## 2019-11-09 MED ORDER — SUGAMMADEX SODIUM 500 MG/5ML IV SOLN
INTRAVENOUS | Status: AC
  Filled 2019-11-09: qty 5

## 2019-11-09 MED ORDER — LIDOCAINE HCL (PF) 2 % IJ SOLN
INTRAMUSCULAR | Status: AC
  Filled 2019-11-09: qty 5

## 2019-11-09 MED ORDER — ONDANSETRON HCL 4 MG/2ML IJ SOLN
INTRAMUSCULAR | Status: DC | PRN
  Administered 2019-11-09: 4 mg via INTRAVENOUS

## 2019-11-09 MED ORDER — ONDANSETRON HCL 4 MG/2ML IJ SOLN
INTRAMUSCULAR | Status: AC
  Filled 2019-11-09: qty 2

## 2019-11-09 MED ORDER — DEXAMETHASONE SODIUM PHOSPHATE 10 MG/ML IJ SOLN
INTRAMUSCULAR | Status: DC | PRN
  Administered 2019-11-09: 10 mg via INTRAVENOUS

## 2019-11-09 MED ORDER — DEXAMETHASONE SODIUM PHOSPHATE 10 MG/ML IJ SOLN
INTRAMUSCULAR | Status: AC
  Filled 2019-11-09: qty 1

## 2019-11-09 MED ORDER — PHENYLEPHRINE HCL (PRESSORS) 10 MG/ML IV SOLN
INTRAVENOUS | Status: DC | PRN
  Administered 2019-11-09: 150 ug via INTRAVENOUS
  Administered 2019-11-09: 100 ug via INTRAVENOUS

## 2019-11-09 MED ORDER — ROCURONIUM BROMIDE 100 MG/10ML IV SOLN
INTRAVENOUS | Status: DC | PRN
  Administered 2019-11-09 (×2): 30 mg via INTRAVENOUS
  Administered 2019-11-09 (×2): 20 mg via INTRAVENOUS

## 2019-11-09 MED ORDER — ACETAMINOPHEN 10 MG/ML IV SOLN
INTRAVENOUS | Status: AC
  Filled 2019-11-09: qty 100

## 2019-11-09 MED ORDER — MIDAZOLAM HCL 2 MG/2ML IJ SOLN
INTRAMUSCULAR | Status: DC | PRN
  Administered 2019-11-09 (×2): 1 mg via INTRAVENOUS

## 2019-11-09 MED ORDER — PROPOFOL 10 MG/ML IV BOLUS
INTRAVENOUS | Status: DC | PRN
  Administered 2019-11-09: 150 mg via INTRAVENOUS

## 2019-11-09 MED ORDER — BUPIVACAINE HCL (PF) 0.25 % IJ SOLN
INTRAMUSCULAR | Status: AC
  Filled 2019-11-09: qty 30

## 2019-11-09 MED ORDER — PROPOFOL 10 MG/ML IV BOLUS
INTRAVENOUS | Status: AC
  Filled 2019-11-09: qty 20

## 2019-11-09 MED ORDER — FENTANYL CITRATE (PF) 100 MCG/2ML IJ SOLN
INTRAMUSCULAR | Status: AC
  Filled 2019-11-09: qty 2

## 2019-11-09 MED ORDER — DEXMEDETOMIDINE HCL 200 MCG/2ML IV SOLN
INTRAVENOUS | Status: DC | PRN
  Administered 2019-11-09: 12 ug via INTRAVENOUS

## 2019-11-09 MED ORDER — LIDOCAINE HCL (CARDIAC) PF 100 MG/5ML IV SOSY
PREFILLED_SYRINGE | INTRAVENOUS | Status: DC | PRN
  Administered 2019-11-09: 80 mg via INTRAVENOUS

## 2019-11-09 MED ORDER — ROCURONIUM BROMIDE 10 MG/ML (PF) SYRINGE
PREFILLED_SYRINGE | INTRAVENOUS | Status: AC
  Filled 2019-11-09: qty 10

## 2019-11-09 MED ORDER — ACETAMINOPHEN 10 MG/ML IV SOLN
INTRAVENOUS | Status: DC | PRN
  Administered 2019-11-09: 1000 mg via INTRAVENOUS

## 2019-11-09 MED ORDER — BUPIVACAINE-EPINEPHRINE 0.25% -1:200000 IJ SOLN
INTRAMUSCULAR | Status: DC | PRN
  Administered 2019-11-09: 18 mL

## 2019-11-09 SURGICAL SUPPLY — 52 items
BAG INFUSER PRESSURE 100CC (MISCELLANEOUS) IMPLANT
BLADE SURG SZ11 CARB STEEL (BLADE) ×2 IMPLANT
CANISTER SUCT 1200ML W/VALVE (MISCELLANEOUS) ×2 IMPLANT
CANNULA REDUC XI 12-8 STAPL (CANNULA) ×1
CANNULA REDUCER 12-8 DVNC XI (CANNULA) ×1 IMPLANT
CHLORAPREP W/TINT 26 (MISCELLANEOUS) ×2 IMPLANT
CLIP VESOLOCK MED LG 6/CT (CLIP) ×2 IMPLANT
COVER WAND RF STERILE (DRAPES) ×2 IMPLANT
DECANTER SPIKE VIAL GLASS SM (MISCELLANEOUS) ×2 IMPLANT
DEFOGGER SCOPE WARMER CLEARIFY (MISCELLANEOUS) ×2 IMPLANT
DERMABOND ADVANCED (GAUZE/BANDAGES/DRESSINGS) ×1
DERMABOND ADVANCED .7 DNX12 (GAUZE/BANDAGES/DRESSINGS) ×1 IMPLANT
DRAPE ARM DVNC X/XI (DISPOSABLE) ×4 IMPLANT
DRAPE COLUMN DVNC XI (DISPOSABLE) ×1 IMPLANT
DRAPE DA VINCI XI ARM (DISPOSABLE) ×4
DRAPE DA VINCI XI COLUMN (DISPOSABLE) ×1
ELECT REM PT RETURN 9FT ADLT (ELECTROSURGICAL) ×2
ELECTRODE REM PT RTRN 9FT ADLT (ELECTROSURGICAL) ×1 IMPLANT
GLOVE BIO SURGEON STRL SZ 6.5 (GLOVE) ×4 IMPLANT
GLOVE BIOGEL PI IND STRL 6.5 (GLOVE) ×2 IMPLANT
GLOVE BIOGEL PI INDICATOR 6.5 (GLOVE) ×2
GOWN STRL REUS W/ TWL LRG LVL3 (GOWN DISPOSABLE) ×3 IMPLANT
GOWN STRL REUS W/TWL LRG LVL3 (GOWN DISPOSABLE) ×3
GRASPER SUT TROCAR 14GX15 (MISCELLANEOUS) IMPLANT
IRRIGATOR SUCT 8 DISP DVNC XI (IRRIGATION / IRRIGATOR) IMPLANT
IRRIGATOR SUCTION 8MM XI DISP (IRRIGATION / IRRIGATOR)
IV NS 1000ML (IV SOLUTION)
IV NS 1000ML BAXH (IV SOLUTION) IMPLANT
KIT PINK PAD W/HEAD ARE REST (MISCELLANEOUS) ×2
KIT PINK PAD W/HEAD ARM REST (MISCELLANEOUS) ×1 IMPLANT
LABEL OR SOLS (LABEL) ×2 IMPLANT
NEEDLE HYPO 22GX1.5 SAFETY (NEEDLE) ×2 IMPLANT
NEEDLE INSUFFLATION 14GA 120MM (NEEDLE) ×2 IMPLANT
NS IRRIG 500ML POUR BTL (IV SOLUTION) ×2 IMPLANT
OBTURATOR OPTICAL STANDARD 8MM (TROCAR) ×1
OBTURATOR OPTICAL STND 8 DVNC (TROCAR) ×1
OBTURATOR OPTICALSTD 8 DVNC (TROCAR) ×1 IMPLANT
PACK LAP CHOLECYSTECTOMY (MISCELLANEOUS) ×2 IMPLANT
POUCH SPECIMEN RETRIEVAL 10MM (ENDOMECHANICALS) ×2 IMPLANT
SEAL CANN UNIV 5-8 DVNC XI (MISCELLANEOUS) ×3 IMPLANT
SEAL XI 5MM-8MM UNIVERSAL (MISCELLANEOUS) ×3
SET TUBE SMOKE EVAC HIGH FLOW (TUBING) ×2 IMPLANT
SOLUTION ELECTROLUBE (MISCELLANEOUS) ×2 IMPLANT
SPONGE LAP 4X18 RFD (DISPOSABLE) ×2 IMPLANT
STAPLER CANNULA SEAL DVNC XI (STAPLE) ×1 IMPLANT
STAPLER CANNULA SEAL XI (STAPLE) ×1
SUT MNCRL 4-0 (SUTURE) ×2
SUT MNCRL 4-0 27XMFL (SUTURE) ×2
SUT VIC AB 3-0 SH 27 (SUTURE)
SUT VIC AB 3-0 SH 27X BRD (SUTURE) IMPLANT
SUT VICRYL 0 AB UR-6 (SUTURE) IMPLANT
SUTURE MNCRL 4-0 27XMF (SUTURE) ×2 IMPLANT

## 2019-11-09 NOTE — Anesthesia Procedure Notes (Signed)
Procedure Name: Intubation Performed by: Cory Munch, RN Pre-anesthesia Checklist: Patient identified, Patient being monitored, Timeout performed, Emergency Drugs available and Suction available Patient Re-evaluated:Patient Re-evaluated prior to induction Oxygen Delivery Method: Circle system utilized Preoxygenation: Pre-oxygenation with 100% oxygen Induction Type: IV induction Ventilation: Two handed mask ventilation required, Oral airway inserted - appropriate to patient size and Mask ventilation without difficulty Laryngoscope Size: McGraph and 4 Grade View: Grade I Tube type: Oral Tube size: 7.5 mm Number of attempts: 1 Airway Equipment and Method: Stylet Placement Confirmation: ETT inserted through vocal cords under direct vision,  positive ETCO2 and breath sounds checked- equal and bilateral Secured at: 23 cm Tube secured with: Tape Dental Injury: Teeth and Oropharynx as per pre-operative assessment

## 2019-11-09 NOTE — Interval H&P Note (Signed)
History and Physical Interval Note:  11/09/2019 1:28 PM  Jeremy West  has presented today for surgery, with the diagnosis of cholecyctectitis.  The various methods of treatment have been discussed with the patient and family. After consideration of risks, benefits and other options for treatment, the patient has consented to  Procedure(s): XI ROBOTIC [place] (N/A) as a surgical intervention.  The patient's history has been reviewed, patient examined, no change in status, stable for surgery.  I have reviewed the patient's chart and labs.  Questions were answered to the patient's satisfaction.     Herbert Pun

## 2019-11-09 NOTE — Anesthesia Preprocedure Evaluation (Signed)
Anesthesia Evaluation  Patient identified by MRN, date of birth, ID band Patient awake    Reviewed: Allergy & Precautions, NPO status , Patient's Chart, lab work & pertinent test results  History of Anesthesia Complications Negative for: history of anesthetic complications  Airway Mallampati: III       Dental   Pulmonary sleep apnea and Continuous Positive Airway Pressure Ventilation , neg COPD, Not current smoker,           Cardiovascular hypertension, Pt. on medications (-) Past MI and (-) CHF + dysrhythmias Atrial Fibrillation (-) Valvular Problems/Murmurs     Neuro/Psych neg Seizures (seizures as a child, none since 64 yo)    GI/Hepatic Neg liver ROS, GERD  Controlled,  Endo/Other  neg diabetesMorbid obesity  Renal/GU negative Renal ROS     Musculoskeletal   Abdominal   Peds  Hematology   Anesthesia Other Findings   Reproductive/Obstetrics                             Anesthesia Physical Anesthesia Plan  ASA: III  Anesthesia Plan: General   Post-op Pain Management:    Induction: Intravenous  PONV Risk Score and Plan: 2 and Ondansetron and Dexamethasone  Airway Management Planned: Oral ETT  Additional Equipment:   Intra-op Plan:   Post-operative Plan:   Informed Consent: I have reviewed the patients History and Physical, chart, labs and discussed the procedure including the risks, benefits and alternatives for the proposed anesthesia with the patient or authorized representative who has indicated his/her understanding and acceptance.       Plan Discussed with:   Anesthesia Plan Comments:         Anesthesia Quick Evaluation

## 2019-11-09 NOTE — Transfer of Care (Signed)
Immediate Anesthesia Transfer of Care Note  Patient: Jeremy West  Procedure(s) Performed: XI ROBOTIC ASSISTED LAPAROSCOPIC CHOLECYSTECTOMY (N/A Abdomen)  Patient Location: PACU  Anesthesia Type:General  Level of Consciousness: awake  Airway & Oxygen Therapy: Patient connected to face mask oxygen  Post-op Assessment: Post -op Vital signs reviewed and stable  Post vital signs: stable  Last Vitals:  Vitals Value Taken Time  BP 119/70   Temp    Pulse 91 11/09/19 1648  Resp 23 11/09/19 1648  SpO2 96 % 11/09/19 1648  Vitals shown include unvalidated device data.  Last Pain:  Vitals:   11/09/19 1301  TempSrc: Temporal  PainSc: 0-No pain      Patients Stated Pain Goal: 0 ([id number])  Complications: No apparent anesthesia complications

## 2019-11-09 NOTE — Op Note (Signed)
Preoperative diagnosis: Acute cholecystitis   Postoperative diagnosis: Acute cholecystitis  Procedure: Robotic Assisted Laparoscopic Cholecystectomy.   Anesthesia: GETA   Surgeon: Dr. Windell Moment  Wound Classification: Clean Contaminated  Indications: Patient is a 54 y.o. male developed right upper quadrant pain and on workup was found to have cholelithiasis with a normal common duct with the stone impacted in the neck of the gallbladder. Pain did not improved with narcotics at the ED. Robotic Assisted Laparoscopic cholecystectomy was elected.  Findings: Pericholecystic edema identified  Critical view of safety achieved Cystic duct and artery identified, ligated and divided Adequate hemostasis  Description of procedure: The patient was placed on the operating table in the supine position. General anesthesia was induced. A time-out was completed verifying correct patient, procedure, site, positioning, and implant(s) and/or special equipment prior to beginning this procedure. An orogastric tube was placed. The abdomen was prepped and draped in the usual sterile fashion.  An incision was made in a natural skin line below the umbilicus.  The fascia was elevated and the Veress needle inserted. Proper position was confirmed by aspiration and saline meniscus test.  The abdomen was insufflated with carbon dioxide to a pressure of 15 mmHg. The patient tolerated insufflation well. A 8-mm trocar was then inserted in optiview fashion.  The laparoscope was inserted and the abdomen inspected. No injuries from initial trocar placement were noted. Additional trocars were then inserted in the following locations: an 8-mm trocar in the left lateral abdomen, and another two 8-mm trocars to the right side of the abdomen 5 cm appart. The umbilical trocar was changed to a 12 mm trocar all under direct visualization. The abdomen was inspected and no abnormalities were found. The table was placed in the reverse  Trendelenburg position with the right side up. The robotic arms were docked and target anatomy identified. Instrument inserted under direct visualization.  Filmy adhesions between the gallbladder and omentum, duodenum and transverse colon were lysed with electrocautery. The dome of the gallbladder was grasped with a prograsp and retracted over the dome of the liver. The infundibulum was also grasped with an atraumatic grasper and retracted toward the right lower quadrant. This maneuver exposed Calot's triangle. The peritoneum overlying the gallbladder infundibulum was then incised and the cystic duct and cystic artery identified and circumferentially dissected. Critical view of safety reviewed before ligating any structure. Firefly images taken to visualize biliary ducts. The cystic duct and cystic artery were then doubly clipped and divided close to the gallbladder.  The gallbladder was then dissected from its peritoneal attachments by electrocautery. Hemostasis was checked and the gallbladder and contained stones were removed using an endoscopic retrieval bag. The gallbladder was passed off the table as a specimen. The gallbladder fossa was copiously irrigated with saline and hemostasis was obtained. There was no evidence of bleeding from the gallbladder fossa or cystic artery or leakage of the bile from the cystic duct stump. Secondary trocars were removed under direct vision. No bleeding was noted. The robotic arms were undoked. The scope was withdrawn and the umbilical trocar removed. The abdomen was allowed to collapse. The fascia of the 30mm trocar sites was closed with figure-of-eight 0 vicryl sutures. The skin was closed with subcuticular sutures of 4-0 monocryl and topical skin adhesive. The orogastric tube was removed.  The patient tolerated the procedure well and was taken to the postanesthesia care unit in stable condition.   Specimen: Gallbladder  Complications: None  EBL: 5 mL

## 2019-11-10 MED ORDER — HYDROXYZINE HCL 50 MG PO TABS: 50.0000 mg | ORAL_TABLET | Freq: Three times a day (TID) | ORAL | 0 refills | Status: DC | PRN

## 2019-11-10 MED ORDER — HYDROCODONE-ACETAMINOPHEN 5-325 MG PO TABS: 1.0000 | ORAL_TABLET | ORAL | 0 refills | Status: AC | PRN

## 2019-11-10 NOTE — Discharge Instructions (Signed)

## 2019-11-10 NOTE — Discharge Summary (Signed)
Patient ID: BRITT BORST MRN: [id number] DOB/AGE: [birth year] 54 y.o.  Admit date: 11/08/2019 Discharge date: 11/10/2019   Discharge Diagnoses:  Active Problems:   Acute cholecystitis   Procedures: Robotic assisted laparoscopic cholecystectomy  Hospital Course: Patient with acute cholecystitis.  He underwent robotic assisted laparoscopic cholecystectomy.  He tolerated the procedure well.  He is tolerating diet.  He is ambulating.  Pain under control.  No problem with wounds.  Physical Exam  Constitutional: He is oriented to person, place, and time and well-developed, well-nourished, and in no distress.  Cardiovascular: Normal rate.  Pulmonary/Chest: Effort normal.  Abdominal: Soft. He exhibits no distension. There is no abdominal tenderness. There is no rebound.  Neurological: He is alert and oriented to person, place, and time.     Consults: None  Disposition: Discharge disposition: 01-Home or Self Care       Discharge Instructions    Diet - low sodium heart healthy   Complete by: As directed    Increase activity slowly   Complete by: As directed      Allergies as of 11/10/2019      Reactions   Penicillins Anaphylaxis   Childhood reaction Has patient had a PCN reaction causing immediate rash, facial/tongue/throat swelling, SOB or lightheadedness with hypotension: Unknown Has patient had a PCN reaction causing severe rash involving mucus membranes or skin necrosis: Unknown Has patient had a PCN reaction that required hospitalization: Unknown Has patient had a PCN reaction occurring within the last 10 years: No If all of the above answers are "NO", then may proceed with Cephalosporin use.   Adalimumab       Medication List    TAKE these medications   Abatacept 125 MG/ML Soaj Inject 125 mg into the skin once a week.   acetaminophen 650 MG CR tablet Commonly known as: TYLENOL Take 650 mg by mouth every 8 (eight) hours as needed for pain.   diclofenac  sodium 1 % Gel Commonly known as: VOLTAREN Apply 4 g topically 4 (four) times daily.   diltiazem 180 MG 24 hr capsule Commonly known as: CARDIZEM CD Take 1 capsule (180 mg total) by mouth daily.   folic acid 1 MG tablet Commonly known as: FOLVITE Take 1 mg by mouth daily.   HYDROcodone-acetaminophen 5-325 MG tablet Commonly known as: Norco Take 1 tablet by mouth every 4 (four) hours as needed for up to 3 days for moderate pain.   hydroxychloroquine 200 MG tablet Commonly known as: PLAQUENIL Take 400 mg by mouth daily.   hydrOXYzine 50 MG tablet Commonly known as: ATARAX/VISTARIL Take 1 tablet (50 mg total) by mouth 3 (three) times daily as needed for itching.   methotrexate 1 g injection Commonly known as: 50 mg/ml 0.8 mLs once a week.

## 2019-11-10 NOTE — Progress Notes (Signed)
Coral Else to be D/C'd Home per MD order.  Discussed prescriptions and follow up appointments with the patient. Prescriptions given to patient, medication list explained in detail. Pt verbalized understanding.  Allergies as of 11/10/2019      Reactions   Penicillins Anaphylaxis   Childhood reaction Has patient had a PCN reaction causing immediate rash, facial/tongue/throat swelling, SOB or lightheadedness with hypotension: Unknown Has patient had a PCN reaction causing severe rash involving mucus membranes or skin necrosis: Unknown Has patient had a PCN reaction that required hospitalization: Unknown Has patient had a PCN reaction occurring within the last 10 years: No If all of the above answers are "NO", then may proceed with Cephalosporin use.   Adalimumab       Medication List    TAKE these medications   Abatacept 125 MG/ML Soaj Inject 125 mg into the skin once a week.   acetaminophen 650 MG CR tablet Commonly known as: TYLENOL Take 650 mg by mouth every 8 (eight) hours as needed for pain.   diclofenac sodium 1 % Gel Commonly known as: VOLTAREN Apply 4 g topically 4 (four) times daily.   diltiazem 180 MG 24 hr capsule Commonly known as: CARDIZEM CD Take 1 capsule (180 mg total) by mouth daily.   folic acid 1 MG tablet Commonly known as: FOLVITE Take 1 mg by mouth daily.   HYDROcodone-acetaminophen 5-325 MG tablet Commonly known as: Norco Take 1 tablet by mouth every 4 (four) hours as needed for up to 3 days for moderate pain.   hydroxychloroquine 200 MG tablet Commonly known as: PLAQUENIL Take 400 mg by mouth daily.   hydrOXYzine 50 MG tablet Commonly known as: ATARAX/VISTARIL Take 1 tablet (50 mg total) by mouth 3 (three) times daily as needed for itching.   methotrexate 1 g injection Commonly known as: 50 mg/ml 0.8 mLs once a week.       Vitals:   11/10/19 0455 11/10/19 0803  BP: 136/78 (!) 117/92  Pulse: 92 83  Resp: 20 20  Temp: 97.9 F (36.6  C) 98.1 F (36.7 C)  SpO2: 98% 99%    Skin clean, dry and intact without evidence of skin break down, no evidence of skin tears noted. IV catheter discontinued intact. Site without signs and symptoms of complications. Dressing and pressure applied. Pt denies pain at this time. No complaints noted.  An After Visit Summary was printed and given to the patient. Patient escorted via [place], and D/C home via private auto.  Fuller Mandril, RN

## 2019-11-11 LAB — SURGICAL PATHOLOGY

## 2019-11-11 NOTE — Anesthesia Postprocedure Evaluation (Signed)
Anesthesia Post Note  Patient: Jeremy West  Procedure(s) Performed: XI ROBOTIC ASSISTED LAPAROSCOPIC CHOLECYSTECTOMY (N/A Abdomen)  Patient location during evaluation: PACU Anesthesia Type: General Level of consciousness: awake and alert Pain management: pain level controlled Vital Signs Assessment: post-procedure vital signs reviewed and stable Respiratory status: spontaneous breathing, nonlabored ventilation, respiratory function stable and patient connected to nasal cannula oxygen Cardiovascular status: blood pressure returned to baseline and stable Postop Assessment: no apparent nausea or vomiting Anesthetic complications: no     Last Vitals:  Vitals:   11/10/19 0455 11/10/19 0803  BP: 136/78 (!) 117/92  Pulse: 92 83  Resp: 20 20  Temp: 36.6 C 36.7 C  SpO2: 98% 99%    Last Pain:  Vitals:   11/10/19 0850  TempSrc:   PainSc: 6                  Molli Barrows

## 2019-12-21 DIAGNOSIS — M75101 Unspecified rotator cuff tear or rupture of right shoulder, not specified as traumatic: Secondary | ICD-10-CM | POA: Diagnosis not present

## 2019-12-21 DIAGNOSIS — E663 Overweight: Secondary | ICD-10-CM | POA: Diagnosis not present

## 2020-01-10 DIAGNOSIS — M75121 Complete rotator cuff tear or rupture of right shoulder, not specified as traumatic: Secondary | ICD-10-CM | POA: Diagnosis not present

## 2020-01-20 DIAGNOSIS — Z01818 Encounter for other preprocedural examination: Secondary | ICD-10-CM | POA: Diagnosis not present

## 2020-02-08 ENCOUNTER — Telehealth: Payer: Self-pay | Admitting: Cardiovascular Disease

## 2020-02-08 NOTE — Telephone Encounter (Signed)
Called patient. He stated that he is having a urgery that will cause him not to drive for 5-6 weeks. Patient was asked if he wanted to schedule after that and began to state if he can not get his medication before he is seen he will go to another doctor that is more compassionate and does not care about just making money. I stated to the patient that was never said and we can work together to figure something out, phone call was then disconnected.

## 2020-02-08 NOTE — Telephone Encounter (Signed)
Please reschedule overdue F/U appointment with Dr. Rockey Situ. Thank you!

## 2020-02-08 NOTE — Telephone Encounter (Signed)
Patient was last seen 09/2018. Please advise if you would like [place] to refill without an appointment.

## 2020-02-09 HISTORY — PX: ROTATOR CUFF REPAIR: SHX139

## 2020-02-09 NOTE — Telephone Encounter (Signed)
Spoke with patient and reviewed that provider did allow one 30 day supply and 1 refill. He states that having to come every year is money gouging especially when there is no reason or changes. He also went on to say that if provider wants to play games then he will find another provider who can mange his medications without any necessary appointment. I did review our policy requires patients to be seen once a year in order to refill any medications. Discussed that this is important to make sure nothing has changed or if any adjustments need to be made. He verbalized understanding, was agreeable to try and come in once he has recovered from his surgery. He verbalized understanding with no further questions at this time.

## 2020-02-09 NOTE — Telephone Encounter (Signed)
I am not sure which medication he is talking about as it is not listed in the conversation If it is the diltiazem we can refill the diltiazem 30 days 1 refill Would encourage him to establish with primary care.  [facility] can [facility] medication, it is not proprietary Can come in and see [place] for more refills

## 2020-02-10 DIAGNOSIS — M25511 Pain in right shoulder: Secondary | ICD-10-CM | POA: Diagnosis not present

## 2020-02-10 DIAGNOSIS — Z4789 Encounter for other orthopedic aftercare: Secondary | ICD-10-CM | POA: Diagnosis not present

## 2020-02-16 DIAGNOSIS — Z4789 Encounter for other orthopedic aftercare: Secondary | ICD-10-CM | POA: Diagnosis not present

## 2020-02-16 DIAGNOSIS — M25511 Pain in right shoulder: Secondary | ICD-10-CM | POA: Diagnosis not present

## 2020-02-23 DIAGNOSIS — Z4789 Encounter for other orthopedic aftercare: Secondary | ICD-10-CM | POA: Diagnosis not present

## 2020-02-23 DIAGNOSIS — M25511 Pain in right shoulder: Secondary | ICD-10-CM | POA: Diagnosis not present

## 2020-03-01 DIAGNOSIS — Z4789 Encounter for other orthopedic aftercare: Secondary | ICD-10-CM | POA: Diagnosis not present

## 2020-03-01 DIAGNOSIS — M25511 Pain in right shoulder: Secondary | ICD-10-CM | POA: Diagnosis not present

## 2020-03-08 DIAGNOSIS — M25511 Pain in right shoulder: Secondary | ICD-10-CM | POA: Diagnosis not present

## 2020-03-08 DIAGNOSIS — Z4789 Encounter for other orthopedic aftercare: Secondary | ICD-10-CM | POA: Diagnosis not present

## 2020-03-09 ENCOUNTER — Other Ambulatory Visit: Payer: Self-pay | Admitting: Cardiovascular Disease

## 2020-03-15 DIAGNOSIS — M25511 Pain in right shoulder: Secondary | ICD-10-CM | POA: Diagnosis not present

## 2020-03-15 DIAGNOSIS — Z4789 Encounter for other orthopedic aftercare: Secondary | ICD-10-CM | POA: Diagnosis not present

## 2020-03-21 DIAGNOSIS — M25511 Pain in right shoulder: Secondary | ICD-10-CM | POA: Diagnosis not present

## 2020-03-21 DIAGNOSIS — Z4789 Encounter for other orthopedic aftercare: Secondary | ICD-10-CM | POA: Diagnosis not present

## 2020-03-29 DIAGNOSIS — M25511 Pain in right shoulder: Secondary | ICD-10-CM | POA: Diagnosis not present

## 2020-03-29 DIAGNOSIS — Z4789 Encounter for other orthopedic aftercare: Secondary | ICD-10-CM | POA: Diagnosis not present

## 2020-04-05 DIAGNOSIS — M25511 Pain in right shoulder: Secondary | ICD-10-CM | POA: Diagnosis not present

## 2020-04-05 DIAGNOSIS — Z4789 Encounter for other orthopedic aftercare: Secondary | ICD-10-CM | POA: Diagnosis not present

## 2020-04-16 DIAGNOSIS — M25511 Pain in right shoulder: Secondary | ICD-10-CM | POA: Diagnosis not present

## 2020-04-16 DIAGNOSIS — Z4789 Encounter for other orthopedic aftercare: Secondary | ICD-10-CM | POA: Diagnosis not present

## 2020-04-17 ENCOUNTER — Ambulatory Visit: Payer: Self-pay

## 2020-04-17 ENCOUNTER — Other Ambulatory Visit: Payer: Self-pay

## 2020-04-17 DIAGNOSIS — Z0000 Encounter for general adult medical examination without abnormal findings: Secondary | ICD-10-CM

## 2020-04-17 LAB — POCT URINALYSIS DIPSTICK
Bilirubin, UA: NEGATIVE
Blood, UA: NEGATIVE
Glucose, UA: NEGATIVE
Ketones, UA: NEGATIVE
Leukocytes, UA: NEGATIVE
Nitrite, UA: NEGATIVE
Protein, UA: POSITIVE — AB
Spec Grav, UA: >=1.03 — AB (ref 1.010–1.025)
Urobilinogen, UA: 0.2 E.U./dL
pH, UA: 5.5 (ref 5.0–8.0)

## 2020-04-17 NOTE — Progress Notes (Signed)
Scheduled to complete physical 04/24/20 with Letitia Neri, PA-C.  AMD

## 2020-04-18 LAB — CMP12+LP+TP+TSH+6AC+PSA+CBC…
ALT: 19 IU/L (ref 0–44)
AST: 16 IU/L (ref 0–40)
Albumin/Globulin Ratio: 1.7 (ref 1.2–2.2)
Albumin: 4.5 g/dL (ref 3.8–4.9)
Alkaline Phosphatase: 78 IU/L (ref 44–121)
BUN/Creatinine Ratio: 13 (ref 9–20)
BUN: 13 mg/dL (ref 6–24)
Basophils Absolute: 0 10*3/uL (ref 0.0–0.2)
Basos: 0 %
Bilirubin Total: 0.8 mg/dL (ref 0.0–1.2)
Calcium: 9.7 mg/dL (ref 8.7–10.2)
Chloride: 102 mmol/L (ref 96–106)
Chol/HDL Ratio: 3.7 ratio (ref 0.0–5.0)
Cholesterol, Total: 180 mg/dL (ref 100–199)
Creatinine, Ser: 1 mg/dL (ref 0.76–1.27)
EOS (ABSOLUTE): 0 10*3/uL (ref 0.0–0.4)
Eos: 0 %
Estimated CHD Risk: 0.6 times avg. (ref 0.0–1.0)
Free Thyroxine Index: 1.6 (ref 1.2–4.9)
GFR calc Af Amer: 99 mL/min/{1.73_m2} (ref >59)
GFR calc non Af Amer: 86 mL/min/{1.73_m2} (ref >59)
GGT: 30 IU/L (ref 0–65)
Globulin, Total: 2.7 g/dL (ref 1.5–4.5)
Glucose: 117 mg/dL — ABNORMAL HIGH (ref 65–99)
HDL: 49 mg/dL (ref >39)
Hematocrit: 45 % (ref 37.5–51.0)
Hemoglobin: 15.2 g/dL (ref 13.0–17.7)
Immature Grans (Abs): 0 10*3/uL (ref 0.0–0.1)
Immature Granulocytes: 0 %
Iron: 57 ug/dL (ref 38–169)
LDH: 179 IU/L (ref 121–224)
LDL Chol Calc (NIH): 114 mg/dL — ABNORMAL HIGH (ref 0–99)
Lymphocytes Absolute: 1.2 10*3/uL (ref 0.7–3.1)
Lymphs: 13 %
MCH: 27 pg (ref 26.6–33.0)
MCHC: 33.8 g/dL (ref 31.5–35.7)
MCV: 80 fL (ref 79–97)
Monocytes Absolute: 0.7 10*3/uL (ref 0.1–0.9)
Monocytes: 8 %
Neutrophils Absolute: 7.3 10*3/uL — ABNORMAL HIGH (ref 1.4–7.0)
Neutrophils: 79 %
Phosphorus: 3.2 mg/dL (ref 2.8–4.1)
Platelets: 337 10*3/uL (ref 150–450)
Potassium: 5 mmol/L (ref 3.5–5.2)
Prostate Specific Ag, Serum: 0.9 ng/mL (ref 0.0–4.0)
RBC: 5.64 x10E6/uL (ref 4.14–5.80)
RDW: 14.8 % (ref 11.6–15.4)
Sodium: 141 mmol/L (ref 134–144)
T3 Uptake Ratio: 23 % — ABNORMAL LOW (ref 24–39)
T4, Total: 6.8 ug/dL (ref 4.5–12.0)
TSH: 0.198 u[IU]/mL — ABNORMAL LOW (ref 0.450–4.500)
Total Protein: 7.2 g/dL (ref 6.0–8.5)
Triglycerides: 95 mg/dL (ref 0–149)
Uric Acid: 4.5 mg/dL (ref 3.8–8.4)
VLDL Cholesterol Cal: 17 mg/dL (ref 5–40)
WBC: 9.3 10*3/uL (ref 3.4–10.8)

## 2020-04-24 ENCOUNTER — Ambulatory Visit: Payer: Self-pay | Admitting: Emergency Medicine

## 2020-04-24 ENCOUNTER — Other Ambulatory Visit: Payer: Self-pay

## 2020-04-24 ENCOUNTER — Encounter: Payer: Self-pay | Admitting: Emergency Medicine

## 2020-04-24 VITALS — BP 123/90 | HR 80 | Temp 97.3°F | Resp 12 | Ht 72.0 in | Wt 341.0 lb

## 2020-04-24 DIAGNOSIS — Z0000 Encounter for general adult medical examination without abnormal findings: Secondary | ICD-10-CM

## 2020-04-24 NOTE — Progress Notes (Signed)
I have reviewed the triage vital signs and the nursing notes.   HISTORY  Chief Complaint Annual Exam  HPI Jeremy West is a 54 y.o. male presents to the clinic for annual physical.  Patient states that all his rheumatoid and osteoarthritis is treated at the [facility].  He denies any new complaints.        Past Medical History:  Diagnosis Date  . Dilated aortic root ([place])    a. 04/2017 Echo: Mod dil Ao root @ 44 mm.  Marland Kitchen GERD (gastroesophageal reflux disease)   . History of echocardiogram    a. 04/2017 Echo: EF 55-60%, Mod dil Ao root @ 44 mm.  mildly dil LA.  Marland Kitchen Hypertension   . Morbid obesity ([place])   . OA (osteoarthritis)   . PAF (paroxysmal atrial fibrillation) ([place])    a. Seen in 01/2010; b. CHA2DS2VASc = 1.  . Rheumatoid arthritis ([place])   . Sleep apnea   . Tubular adenoma of colon 03/12/2015  . Villous adenoma of colon 03/12/2015    Patient Active Problem List   Diagnosis Date Noted  . Acute cholecystitis 11/08/2019  . Gastroesophageal reflux disease 03/10/2019  . Cough 03/10/2019  . Dilated aortic root ([place]) 03/10/2019  . IFG (impaired fasting glucose) 03/10/2019  . History of colonic polyps 03/10/2019  . Chest pain 05/22/2017  . Shortness of breath 05/22/2017  . Atrial fibrillation ([place]) 10/31/2013  . Sleep apnea 10/31/2013  . Obesity 10/31/2013  . Hypertension 10/31/2013  . Seropositive rheumatoid arthritis ([place]) 10/31/2013  . Abnormal transaminases 10/31/2013    Past Surgical History:  Procedure Laterality Date  . ARTHROTOMY N/A   . CHOLECYSTECTOMY  May or June 2021  . COLONOSCOPY WITH PROPOFOL N/A 03/12/2015   Procedure: COLONOSCOPY WITH PROPOFOL;  Surgeon: Manya Silvas, MD;  Location: [facility] ENDOSCOPY;  Service: Endoscopy;  Laterality: N/A;  . complex repair leg Right   . ESOPHAGOGASTRODUODENOSCOPY (EGD) WITH PROPOFOL N/A 03/12/2015   Procedure: ESOPHAGOGASTRODUODENOSCOPY (EGD) WITH PROPOFOL;  Surgeon: Manya Silvas, MD;  Location: [facility]  ENDOSCOPY;  Service: Endoscopy;  Laterality: N/A;  . ROTATOR CUFF REPAIR Right 02/09/2020   [facility]    Prior to Admission medications   Medication Sig Start Date End Date Taking? Authorizing Provider  Abatacept 125 MG/ML SOAJ Inject 125 mg into the skin once a week.   Yes [provider]  acetaminophen (TYLENOL) 650 MG CR tablet Take 650 mg by mouth every 8 (eight) hours as needed for pain.   Yes [provider]  diltiazem (CARDIZEM CD) 180 MG 24 hr capsule TAKE 1 CAPSULE BY MOUTH EVERY DAY 03/09/20  Yes Gollan, Kathlene November, MD  diltiazem (TIAZAC) 180 MG 24 hr capsule Take by mouth. 09/23/18  Yes [provider]  folic acid (FOLVITE) 1 MG tablet Take 1 mg by mouth daily.   Yes [provider]  hydroxychloroquine (PLAQUENIL) 200 MG tablet Take 400 mg by mouth daily.  02/05/17  Yes [provider]  methotrexate (50 MG/ML) 1 g injection 0.8 mLs once a week.   Yes [provider]  pantoprazole (PROTONIX) 40 MG tablet Take by mouth. 12/26/18  Yes [provider]  diclofenac sodium (VOLTAREN) 1 % GEL Apply 4 g topically 4 (four) times daily.    [provider]  hydrOXYzine (ATARAX/VISTARIL) 50 MG tablet Take 1 tablet (50 mg total) by mouth 3 (three) times daily as needed for itching. Patient not taking: Reported on 04/24/2020 11/10/19   Cintron-Diaz,  Reeves Forth, MD  ondansetron (ZOFRAN-ODT) 4 MG disintegrating tablet Take 4 mg by mouth every 8 (eight) hours as needed. 02/09/20   [provider]  oxyCODONE (OXY IR/ROXICODONE) 5 MG immediate release tablet Take 5 mg by mouth every 4 (four) hours as needed. 02/09/20   [provider]    Allergies Adalimumab and Penicillins  Family History  Problem Relation Age of Onset  . Bone cancer Mother        died in his 43s  . Lung cancer Father        died in his 50s  . Thyroid cancer Sister     Social History Social History   Tobacco Use  . Smoking status:  Never Smoker  . Smokeless tobacco: Former Network engineer  . Vaping Use: Never used  Substance Use Topics  . Alcohol use: Yes    Alcohol/week: 1.0 standard drink    Types: 1 Cans of beer per week    Comment: rare  . Drug use: No    Review of Systems Constitutional: No fever/chills Eyes: No visual changes. Cardiovascular: Denies chest pain. Respiratory: Denies shortness of breath. Gastrointestinal: No abdominal pain.  Musculoskeletal: Positive for generalized arthritic changes especially in bilateral knees.  Positive rotator cuff right shoulder. Skin: Negative for rash. Neurological: Negative for headaches, focal weakness or numbness.  ____________________________________________   PHYSICAL EXAM:  VITAL SIGNS: Constitutional: Alert and oriented. Well appearing and in no acute distress. Eyes: Conjunctivae are normal. PERRL. EOMI. Head: Atraumatic. Nose: No congestion/rhinnorhea. Mouth/Throat: Mucous membranes are moist.  Oropharynx non-erythematous. Neck: No stridor.  Cardiovascular: Normal rate, regular rhythm. Grossly normal heart sounds.  Good peripheral circulation. Respiratory: Normal respiratory effort.  No retractions. Lungs CTAB. Gastrointestinal: Soft and nontender. No distention.  Bowel sounds are normoactive x4 quadrants. Musculoskeletal: No point tenderness on palpation of cervical, thoracic or lumbar spine.  Patient is able to ambulate without any assistance.  No point tenderness is noted on palpation of the knees bilaterally and patient is able move upper extremities without any restriction. Neurologic:  Normal speech and language. No gross focal neurologic deficits are appreciated. No gait instability. Skin:  Skin is warm, dry and intact. No rash noted. Psychiatric: Mood and affect are normal. Speech and behavior are normal.  ____________________________________________   LABS (all labs ordered are listed, but only abnormal results are  displayed)   ____________________________________________  EKG  Sinus rhythm with ventricular rate of 78. ____________________________________________    FINAL CLINICAL IMPRESSION(S)  Annual physical exam without new findings.    ED Discharge Orders    None      Note:  This document was prepared using Dragon voice recognition software and may include unintentional dictation errors.

## 2020-05-01 DIAGNOSIS — M25511 Pain in right shoulder: Secondary | ICD-10-CM | POA: Diagnosis not present

## 2020-05-01 DIAGNOSIS — Z4789 Encounter for other orthopedic aftercare: Secondary | ICD-10-CM | POA: Diagnosis not present

## 2020-05-08 DIAGNOSIS — M1711 Unilateral primary osteoarthritis, right knee: Secondary | ICD-10-CM | POA: Diagnosis not present

## 2020-05-08 DIAGNOSIS — M948X6 Other specified disorders of cartilage, lower leg: Secondary | ICD-10-CM | POA: Diagnosis not present

## 2020-05-08 DIAGNOSIS — M25561 Pain in right knee: Secondary | ICD-10-CM | POA: Diagnosis not present

## 2020-05-08 DIAGNOSIS — M25461 Effusion, right knee: Secondary | ICD-10-CM | POA: Diagnosis not present

## 2020-05-22 DIAGNOSIS — Z4789 Encounter for other orthopedic aftercare: Secondary | ICD-10-CM | POA: Diagnosis not present

## 2020-05-22 DIAGNOSIS — M25511 Pain in right shoulder: Secondary | ICD-10-CM | POA: Diagnosis not present

## 2020-06-18 DIAGNOSIS — Z4789 Encounter for other orthopedic aftercare: Secondary | ICD-10-CM | POA: Diagnosis not present

## 2020-06-18 DIAGNOSIS — M25511 Pain in right shoulder: Secondary | ICD-10-CM | POA: Diagnosis not present

## 2020-07-10 DIAGNOSIS — M7751 Other enthesopathy of right foot: Secondary | ICD-10-CM | POA: Diagnosis not present

## 2020-07-10 DIAGNOSIS — M19071 Primary osteoarthritis, right ankle and foot: Secondary | ICD-10-CM | POA: Diagnosis not present

## 2020-07-10 DIAGNOSIS — M25571 Pain in right ankle and joints of right foot: Secondary | ICD-10-CM | POA: Diagnosis not present

## 2020-07-10 DIAGNOSIS — M25572 Pain in left ankle and joints of left foot: Secondary | ICD-10-CM | POA: Diagnosis not present

## 2020-08-15 DIAGNOSIS — J029 Acute pharyngitis, unspecified: Secondary | ICD-10-CM | POA: Diagnosis not present

## 2020-08-15 DIAGNOSIS — R220 Localized swelling, mass and lump, head: Secondary | ICD-10-CM | POA: Diagnosis not present

## 2020-08-20 ENCOUNTER — Telehealth: Payer: Self-pay | Admitting: Cardiovascular Disease

## 2020-08-20 NOTE — Telephone Encounter (Signed)
3 attempts to schedule fu appt from recall list.   Deleting recall.   

## 2020-09-08 DIAGNOSIS — M1712 Unilateral primary osteoarthritis, left knee: Secondary | ICD-10-CM | POA: Diagnosis not present

## 2020-09-08 DIAGNOSIS — M06062 Rheumatoid arthritis without rheumatoid factor, left knee: Secondary | ICD-10-CM | POA: Diagnosis not present

## 2020-09-17 DIAGNOSIS — M542 Cervicalgia: Secondary | ICD-10-CM | POA: Diagnosis not present

## 2020-09-17 DIAGNOSIS — M25512 Pain in left shoulder: Secondary | ICD-10-CM | POA: Diagnosis not present

## 2020-09-22 DIAGNOSIS — Z0189 Encounter for other specified special examinations: Secondary | ICD-10-CM | POA: Diagnosis not present

## 2020-09-25 DIAGNOSIS — R9431 Abnormal electrocardiogram [ECG] [EKG]: Secondary | ICD-10-CM | POA: Diagnosis not present

## 2020-09-25 DIAGNOSIS — R001 Bradycardia, unspecified: Secondary | ICD-10-CM | POA: Diagnosis not present

## 2020-09-25 DIAGNOSIS — I4891 Unspecified atrial fibrillation: Secondary | ICD-10-CM | POA: Diagnosis not present

## 2020-09-26 ENCOUNTER — Telehealth: Payer: Self-pay | Admitting: Cardiovascular Disease

## 2020-09-26 ENCOUNTER — Other Ambulatory Visit: Payer: Self-pay

## 2020-09-26 ENCOUNTER — Ambulatory Visit: Payer: Self-pay

## 2020-09-26 VITALS — BP 118/69

## 2020-09-26 DIAGNOSIS — Z0130 Encounter for examination of blood pressure without abnormal findings: Secondary | ICD-10-CM

## 2020-09-26 NOTE — Telephone Encounter (Signed)
Spoke with the patient. He was last seen in March 2020. Patient sts that he was seen in the [place] emergency dept in [facility] yesterday. Patient was having right sided chest pressure. Patient sts that an EKG was performed and he was told that he was in AFIB wih a controlled rate of 90 bpm. Pt sts that emergency physician increased his Diltiazem from 180 mg daily to 240 mg daily. He is not on a anticoag.  He is currently at work. Pt reports having the same right sided chest pressure today. His job has a Primary school teacher. He was in her office when I called him back. Pt VS 120/80 71bpm 97 O2. Patient was scheduled with Dr. Rockey Situ for his first available 10/24/20. Reviewed our [employer] schedules for a sooner appt. Appt scheduled for 10/05/20 @ 8:30am with JV. Patient aware of the appt date and time.  Adv him that in the interim he should seek care at the [facility] ED for reoccurring or worsening symptoms. Patient verbalized understanding and voiced appreciation for the call.

## 2020-09-26 NOTE — Telephone Encounter (Signed)
Patient c/o Palpitations:  High priority if patient c/o lightheadedness, shortness of breath, or chest pain  1) How long have you had palpitations/irregular HR/ Afib? Are you having the symptoms now? A fib since yesterday  2) Are you currently experiencing lightheadedness, SOB or CP? Feels pressure on right side  3) Do you have a history of afib (atrial fibrillation) or irregular heart rhythm? Yes, a fib  4) Have you checked your BP or HR? (document readings if available): unsure of HR now, yesterday it was 97 during an EKG and dropped to 90  5) Are you experiencing any other symptoms? Feels like its going "up and down"  Patient went to [facility] Emergency room yesterday in [facility]
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
## 2020-10-05 ENCOUNTER — Encounter: Payer: Self-pay | Admitting: Physician Assistant

## 2020-10-05 ENCOUNTER — Other Ambulatory Visit: Payer: Self-pay

## 2020-10-05 ENCOUNTER — Ambulatory Visit (INDEPENDENT_AMBULATORY_CARE_PROVIDER_SITE_OTHER): Payer: 59 | Admitting: Physician Assistant

## 2020-10-05 VITALS — BP 150/80 | HR 64 | Ht 72.0 in | Wt 339.5 lb

## 2020-10-05 DIAGNOSIS — Z5320 Procedure and treatment not carried out because of patient's decision for unspecified reasons: Secondary | ICD-10-CM

## 2020-10-05 DIAGNOSIS — M059 Rheumatoid arthritis with rheumatoid factor, unspecified: Secondary | ICD-10-CM

## 2020-10-05 DIAGNOSIS — R7989 Other specified abnormal findings of blood chemistry: Secondary | ICD-10-CM

## 2020-10-05 DIAGNOSIS — I480 Paroxysmal atrial fibrillation: Secondary | ICD-10-CM

## 2020-10-05 DIAGNOSIS — I77810 Thoracic aortic ectasia: Secondary | ICD-10-CM

## 2020-10-05 DIAGNOSIS — G4733 Obstructive sleep apnea (adult) (pediatric): Secondary | ICD-10-CM

## 2020-10-05 MED ORDER — DILTIAZEM HCL ER COATED BEADS 360 MG PO CP24: 360.0000 mg | ORAL_CAPSULE | Freq: Every day | ORAL | 11 refills | Status: DC

## 2020-10-05 NOTE — Patient Instructions (Signed)
Medication Instructions:  Your physician has recommended you make the following change in your medication:   INCREASE Diltiazem 360mg  DAILY   *If you need a refill on your cardiac medications before your next appointment, please call your pharmacy*   Lab Work: None ordered   Testing/Procedures: Your physician has requested that you have an echocardiogram. Echocardiography is a painless test that uses sound waves to create images of your heart. It provides your doctor with information about the size and shape of your heart and how well your heart's chambers and valves are working. This procedure takes approximately one hour. There are no restrictions for this procedure.   Follow-Up: At [facility], you and your health needs are our priority.  As part of our continuing mission to provide you with exceptional heart care, we have created designated Provider Care Teams.  These Care Teams include your primary Cardiologist (physician) and Advanced Practice Providers (APPs -  Physician Assistants and Nurse Practitioners) who all work together to provide you with the care you need, when you need it.  We recommend signing up for the patient portal called "MyChart".  Sign up information is provided on this After Visit Summary.  MyChart is used to connect with patients for Virtual Visits (Telemedicine).  Patients are able to view lab/test results, encounter notes, upcoming appointments, etc.  Non-urgent messages can be sent to your provider as well.   To learn more about what you can do with MyChart, go to [email or web address].    Your next appointment:   Follow up after echo  The format for your next appointment:   In Person  Provider:   You may see Ida Rogue, MD or one of the following Advanced Practice Providers on your designated Care Team:    Murray Hodgkins, NP  Christell Faith, PA-C  Marrianne Mood, PA-C  Cadence Kathlen Mody, [place]  Laurann Montana, NP    Other  Instructions  Please obtain a blood pressure cuff and monitor BP at home. Goal blood pressure is 130/80.  Please call our office if your BP is consistently higher than 130/80.

## 2020-10-08 ENCOUNTER — Telehealth: Payer: Self-pay | Admitting: *Deleted

## 2020-10-08 NOTE — Telephone Encounter (Signed)
Faxed office note and EKG from visit on 10/05/20 with Marrianne Mood, PA to PCP.  Faxed to Dr. Bing Matter at the [facility] @ [phone].

## 2020-10-15 DIAGNOSIS — H524 Presbyopia: Secondary | ICD-10-CM | POA: Diagnosis not present

## 2020-10-24 ENCOUNTER — Ambulatory Visit: Payer: 59 | Admitting: Cardiovascular Disease

## 2020-10-26 NOTE — Telephone Encounter (Signed)
Patient calling to check status of medical records being sent to [place].    [facility] to [facility].  Fax number below incorrect .   Faxed records to confirmed fax [phone]

## 2020-10-29 DIAGNOSIS — M069 Rheumatoid arthritis, unspecified: Secondary | ICD-10-CM | POA: Diagnosis not present

## 2020-10-29 DIAGNOSIS — R945 Abnormal results of liver function studies: Secondary | ICD-10-CM | POA: Diagnosis not present

## 2020-11-09 ENCOUNTER — Ambulatory Visit (INDEPENDENT_AMBULATORY_CARE_PROVIDER_SITE_OTHER): Payer: 59

## 2020-11-09 ENCOUNTER — Other Ambulatory Visit: Payer: Self-pay

## 2020-11-09 DIAGNOSIS — I480 Paroxysmal atrial fibrillation: Secondary | ICD-10-CM

## 2020-11-09 LAB — ECHOCARDIOGRAM COMPLETE
AR max vel: 5 cm2
AV Area VTI: 5.36 cm2
AV Area mean vel: 4.67 cm2
AV Mean grad: 2 mmHg
AV Peak grad: 3.8 mmHg
Ao pk vel: 0.98 m/s
Area-P 1/2: 3.66 cm2
S' Lateral: 3.8 cm

## 2020-11-09 MED ORDER — PERFLUTREN LIPID MICROSPHERE
1.0000 mL | INTRAVENOUS | Status: AC | PRN
  Administered 2020-11-09: 2 mL via INTRAVENOUS
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
## 2020-11-15 ENCOUNTER — Telehealth: Payer: Self-pay | Admitting: Cardiovascular Disease

## 2020-11-15 NOTE — Telephone Encounter (Signed)
Patient called to cancel  Upcoming visit .  He states he is tired of wearing a mask and is tired of all the covid stuff and doesn't believe in it.     Patient did not reschedule.

## 2020-11-16 ENCOUNTER — Ambulatory Visit: Payer: 59 | Admitting: Physician Assistant

## 2020-12-04 DIAGNOSIS — Z79899 Other long term (current) drug therapy: Secondary | ICD-10-CM | POA: Diagnosis not present

## 2020-12-04 DIAGNOSIS — M069 Rheumatoid arthritis, unspecified: Secondary | ICD-10-CM | POA: Diagnosis not present

## 2020-12-11 DIAGNOSIS — I4891 Unspecified atrial fibrillation: Secondary | ICD-10-CM | POA: Diagnosis not present

## 2020-12-11 DIAGNOSIS — G4733 Obstructive sleep apnea (adult) (pediatric): Secondary | ICD-10-CM | POA: Diagnosis not present

## 2020-12-28 DIAGNOSIS — G4730 Sleep apnea, unspecified: Secondary | ICD-10-CM | POA: Diagnosis not present

## 2020-12-28 DIAGNOSIS — I4891 Unspecified atrial fibrillation: Secondary | ICD-10-CM | POA: Diagnosis not present

## 2020-12-28 DIAGNOSIS — Z86010 Personal history of colonic polyps: Secondary | ICD-10-CM | POA: Diagnosis not present

## 2020-12-28 DIAGNOSIS — M069 Rheumatoid arthritis, unspecified: Secondary | ICD-10-CM | POA: Diagnosis not present

## 2021-01-01 ENCOUNTER — Other Ambulatory Visit: Payer: Self-pay

## 2021-01-01 DIAGNOSIS — R17 Unspecified jaundice: Secondary | ICD-10-CM | POA: Diagnosis not present

## 2021-01-16 DIAGNOSIS — Z0189 Encounter for other specified special examinations: Secondary | ICD-10-CM | POA: Diagnosis not present

## 2021-01-16 DIAGNOSIS — K7581 Nonalcoholic steatohepatitis (NASH): Secondary | ICD-10-CM | POA: Diagnosis not present

## 2021-01-29 DIAGNOSIS — K760 Fatty (change of) liver, not elsewhere classified: Secondary | ICD-10-CM | POA: Diagnosis not present

## 2021-02-25 DIAGNOSIS — R059 Cough, unspecified: Secondary | ICD-10-CM | POA: Diagnosis not present

## 2021-02-25 DIAGNOSIS — R042 Hemoptysis: Secondary | ICD-10-CM | POA: Diagnosis not present

## 2021-02-25 DIAGNOSIS — J019 Acute sinusitis, unspecified: Secondary | ICD-10-CM | POA: Diagnosis not present

## 2021-03-20 DIAGNOSIS — Z9889 Other specified postprocedural states: Secondary | ICD-10-CM | POA: Diagnosis not present

## 2021-05-06 DIAGNOSIS — D124 Benign neoplasm of descending colon: Secondary | ICD-10-CM | POA: Diagnosis not present

## 2021-05-06 DIAGNOSIS — K635 Polyp of colon: Secondary | ICD-10-CM | POA: Diagnosis not present

## 2021-05-06 DIAGNOSIS — Z1211 Encounter for screening for malignant neoplasm of colon: Secondary | ICD-10-CM | POA: Diagnosis not present

## 2021-05-06 DIAGNOSIS — K648 Other hemorrhoids: Secondary | ICD-10-CM | POA: Diagnosis not present

## 2021-05-30 DIAGNOSIS — M069 Rheumatoid arthritis, unspecified: Secondary | ICD-10-CM | POA: Diagnosis not present

## 2021-06-03 DIAGNOSIS — M059 Rheumatoid arthritis with rheumatoid factor, unspecified: Secondary | ICD-10-CM | POA: Diagnosis not present

## 2021-08-26 DIAGNOSIS — M069 Rheumatoid arthritis, unspecified: Secondary | ICD-10-CM | POA: Diagnosis not present

## 2021-08-26 DIAGNOSIS — M1711 Unilateral primary osteoarthritis, right knee: Secondary | ICD-10-CM | POA: Diagnosis not present

## 2021-08-26 DIAGNOSIS — I4891 Unspecified atrial fibrillation: Secondary | ICD-10-CM | POA: Diagnosis not present

## 2021-08-26 DIAGNOSIS — K760 Fatty (change of) liver, not elsewhere classified: Secondary | ICD-10-CM | POA: Diagnosis not present

## 2021-08-26 DIAGNOSIS — M25461 Effusion, right knee: Secondary | ICD-10-CM | POA: Diagnosis not present

## 2021-11-12 ENCOUNTER — Ambulatory Visit: Payer: Self-pay | Admitting: Physician Assistant

## 2021-11-12 ENCOUNTER — Other Ambulatory Visit: Payer: Self-pay

## 2021-11-12 ENCOUNTER — Encounter: Payer: Self-pay | Admitting: Physician Assistant

## 2021-11-12 DIAGNOSIS — J0100 Acute maxillary sinusitis, unspecified: Secondary | ICD-10-CM

## 2021-11-12 DIAGNOSIS — Z1152 Encounter for screening for COVID-19: Secondary | ICD-10-CM

## 2021-11-12 LAB — POCT RAPID STREP A (OFFICE): Rapid Strep A Screen: NEGATIVE

## 2021-11-12 LAB — POC COVID19 BINAXNOW: SARS Coronavirus 2 Ag: NEGATIVE

## 2021-11-12 MED ORDER — FEXOFENADINE-PSEUDOEPHED ER 60-120 MG PO TB12: 1.0000 | ORAL_TABLET | Freq: Two times a day (BID) | ORAL | 0 refills | Status: DC

## 2021-11-12 MED ORDER — METHYLPREDNISOLONE SODIUM SUCC 40 MG IJ SOLR
40.0000 mg | Freq: Once | INTRAMUSCULAR | Status: AC
  Administered 2021-11-12: 40 mg via INTRAMUSCULAR

## 2021-11-12 MED ORDER — BENZONATATE 200 MG PO CAPS: 200.0000 mg | ORAL_CAPSULE | Freq: Two times a day (BID) | ORAL | 0 refills | Status: DC | PRN

## 2021-11-12 MED ORDER — DOXYCYCLINE MONOHYDRATE 100 MG PO CAPS: 100.0000 mg | ORAL_CAPSULE | Freq: Two times a day (BID) | ORAL | 0 refills | Status: DC

## 2021-11-12 NOTE — Progress Notes (Signed)
? ?  Subjective: Sinus congestion  ? ? Patient ID: Jeremy West, male    DOB: 01-06-1966, 56 y.o.   MRN: [id number] ? ?HPI ?Patient presents with 1 week of sinus congestion, sore throat secondary to postnasal drainage and cough.  Denies recent travel or known contact with COVID-19.  Patient tested negative for COVID-19 or strep pharyngitis today.  Past medical history markable for A-fib, GERD, hypertension, impaired fasting glucose. ? ? ?Review of Systems ?Negative except for chief complaint ?   ?Objective:  ? Physical Exam ?HEENT is remarkable for a edematous nasal turbinates with postnasal drainage.  Throat is erythematous with exudative tonsils.  No cervical lymphadenopathy.  Lungs clear to auscultation.  No productive cough.  Heart regular rate and rhythm. ? ? ? ?   ?Assessment & Plan: Maxillary sinusitis  ? ? ?

## 2021-11-12 NOTE — Addendum Note (Signed)
Addended by: Malachy Moan F on: 11/12/2021 03:37 PM ? ? Modules accepted: Orders ? ?

## 2021-11-12 NOTE — Progress Notes (Signed)
Swollen/sore throat with cough for 4 days ? ?

## 2021-11-12 NOTE — Progress Notes (Signed)
Sore throat/swollen tonsils and cough for 4 days. ?

## 2021-11-28 DIAGNOSIS — H524 Presbyopia: Secondary | ICD-10-CM | POA: Diagnosis not present

## 2021-12-09 DIAGNOSIS — M1711 Unilateral primary osteoarthritis, right knee: Secondary | ICD-10-CM | POA: Diagnosis not present

## 2021-12-09 DIAGNOSIS — M25512 Pain in left shoulder: Secondary | ICD-10-CM | POA: Diagnosis not present

## 2021-12-09 DIAGNOSIS — M19042 Primary osteoarthritis, left hand: Secondary | ICD-10-CM | POA: Diagnosis not present

## 2021-12-09 DIAGNOSIS — M069 Rheumatoid arthritis, unspecified: Secondary | ICD-10-CM | POA: Diagnosis not present

## 2021-12-31 ENCOUNTER — Ambulatory Visit: Payer: Self-pay | Admitting: Physician Assistant

## 2021-12-31 ENCOUNTER — Ambulatory Visit
Admission: RE | Admit: 2021-12-31 | Discharge: 2021-12-31 | Disposition: A | Discharge status: Home / Self Care | Payer: No Typology Code available for payment source | Source: Ambulatory Visit | Attending: Physician Assistant | Admitting: Physician Assistant

## 2021-12-31 ENCOUNTER — Ambulatory Visit
Admission: RE | Admit: 2021-12-31 | Discharge: 2021-12-31 | Disposition: A | Discharge status: Home / Self Care | Payer: No Typology Code available for payment source | Attending: Physician Assistant | Admitting: Physician Assistant

## 2021-12-31 ENCOUNTER — Encounter: Payer: Self-pay | Admitting: Physician Assistant

## 2021-12-31 VITALS — BP 132/88 | HR 95 | Temp 99.1°F | Resp 14 | Ht 72.0 in | Wt 360.0 lb

## 2021-12-31 DIAGNOSIS — M25561 Pain in right knee: Secondary | ICD-10-CM | POA: Insufficient documentation

## 2021-12-31 NOTE — Progress Notes (Deleted)
Pt presets today for work related injury to his right knee. Pt was getting out the truck and knee turned/twist when left foot slipped truck step.

## 2021-12-31 NOTE — Progress Notes (Signed)
Subjective: Right knee pain    Patient ID: Jeremy West, male    DOB: 1965/11/21, 56 y.o.   MRN: [id number]  HPI Patient complain inferior right knee pain which occurred when stepping down from a truck.  Patient has a history internal fixation of the right lower leg secondary to prior injury years ago.  Patient states pain increased with weightbearing and ambulation.  Rates the pain as a 5/10.  Describes the pain as "achy".  No palliative measure for complaint.   Review of Systems Internal fixation right lower leg    Objective:   Physical Exam  No acute distress.  Ambulating with atypical gait favoring right lower extremity. BP is 132/88, pulse 95, respiration 14, temperature 99.1, and patient 96% O2 sat on room air.  Patient weighs 360 pounds and BMI is 48.82. Examination of the right knee shows no obvious deformity, edema, or erythema.  Moderate crepitus with palpation.  Moderate guarding palpation inferior aspect of the patella.  Decreased strength with resistance when compared to the left lower extremity.     Assessment & Plan: Right knee pain.  Advised further evaluation with imaging.  Patient given advice on supportive care and will have x-rays done today.  Patient will follow-up tomorrow.

## 2022-01-01 ENCOUNTER — Encounter: Payer: Self-pay | Admitting: Physician Assistant

## 2022-01-01 ENCOUNTER — Ambulatory Visit: Payer: Self-pay | Admitting: Physician Assistant

## 2022-01-01 DIAGNOSIS — M25469 Effusion, unspecified knee: Secondary | ICD-10-CM | POA: Insufficient documentation

## 2022-01-01 DIAGNOSIS — M25561 Pain in right knee: Secondary | ICD-10-CM | POA: Insufficient documentation

## 2022-01-01 DIAGNOSIS — S8391XA Sprain of unspecified site of right knee, initial encounter: Secondary | ICD-10-CM | POA: Insufficient documentation

## 2022-01-01 NOTE — Progress Notes (Signed)
Pt presents today to go over xray results.

## 2022-01-01 NOTE — Progress Notes (Signed)
Subjective: Right knee pain    Patient ID: Jeremy West, male    DOB: [birth year], 56 y.o.   MRN: [id number]  HPI Patient is followed for right knee pain secondary to a twisting incident while exiting a truck yesterday.  This is a work-related injury.  Patient was sent for x-rays and is here today for results.  Patient state continued pain with ambulation.  Patient refused pain medications.   Review of Systems Negative set for chief complaint    Objective:   Physical Exam  No acute distress.  Patient continues to walk for atypical gait. X-ray shows a faint lucency is noted along the superolateral aspect of the patella suspicious for subtle fracture.     Assessment & Plan: Right knee pain  Patient placed on modified duty and sent to orthopedics for definitive evaluation and treatment.  Patient will follow-up in 1 01/06/2022.

## 2022-01-06 ENCOUNTER — Other Ambulatory Visit: Payer: No Typology Code available for payment source | Admitting: Physician Assistant

## 2022-01-09 DIAGNOSIS — F32A Depression, unspecified: Secondary | ICD-10-CM | POA: Insufficient documentation

## 2022-01-09 DIAGNOSIS — K760 Fatty (change of) liver, not elsewhere classified: Secondary | ICD-10-CM | POA: Insufficient documentation

## 2022-01-09 DIAGNOSIS — Z789 Other specified health status: Secondary | ICD-10-CM | POA: Insufficient documentation

## 2022-01-09 DIAGNOSIS — K7581 Nonalcoholic steatohepatitis (NASH): Secondary | ICD-10-CM | POA: Insufficient documentation

## 2022-01-09 DIAGNOSIS — M25561 Pain in right knee: Secondary | ICD-10-CM | POA: Insufficient documentation

## 2022-01-09 DIAGNOSIS — Z01818 Encounter for other preprocedural examination: Secondary | ICD-10-CM | POA: Insufficient documentation

## 2022-01-09 DIAGNOSIS — S46001A Unspecified injury of muscle(s) and tendon(s) of the rotator cuff of right shoulder, initial encounter: Secondary | ICD-10-CM | POA: Insufficient documentation

## 2022-01-09 DIAGNOSIS — K635 Polyp of colon: Secondary | ICD-10-CM | POA: Insufficient documentation

## 2022-01-09 DIAGNOSIS — R454 Irritability and anger: Secondary | ICD-10-CM | POA: Insufficient documentation

## 2022-01-09 DIAGNOSIS — M170 Bilateral primary osteoarthritis of knee: Secondary | ICD-10-CM | POA: Insufficient documentation

## 2022-01-09 DIAGNOSIS — M19079 Primary osteoarthritis, unspecified ankle and foot: Secondary | ICD-10-CM | POA: Insufficient documentation

## 2022-01-10 ENCOUNTER — Ambulatory Visit: Payer: Self-pay

## 2022-01-10 DIAGNOSIS — Z0000 Encounter for general adult medical examination without abnormal findings: Secondary | ICD-10-CM

## 2022-01-10 LAB — POCT URINALYSIS DIPSTICK
Bilirubin, UA: NEGATIVE
Blood, UA: NEGATIVE
Glucose, UA: NEGATIVE
Ketones, UA: NEGATIVE
Leukocytes, UA: NEGATIVE
Nitrite, UA: NEGATIVE
Protein, UA: NEGATIVE
Spec Grav, UA: 1.015 (ref 1.010–1.025)
Urobilinogen, UA: 0.2 E.U./dL
pH, UA: 6 (ref 5.0–8.0)

## 2022-01-11 LAB — CMP12+LP+TP+TSH+6AC+PSA+CBC…
ALT: 62 IU/L — ABNORMAL HIGH (ref 0–44)
AST: 33 IU/L (ref 0–40)
Albumin/Globulin Ratio: 2.6 — ABNORMAL HIGH (ref 1.2–2.2)
Albumin: 4.4 g/dL (ref 3.8–4.9)
Alkaline Phosphatase: 52 IU/L (ref 44–121)
BUN/Creatinine Ratio: 12 (ref 9–20)
BUN: 11 mg/dL (ref 6–24)
Basophils Absolute: 0 10*3/uL (ref 0.0–0.2)
Basos: 1 %
Bilirubin Total: 3.2 mg/dL — ABNORMAL HIGH (ref 0.0–1.2)
Calcium: 9.3 mg/dL (ref 8.7–10.2)
Chloride: 101 mmol/L (ref 96–106)
Chol/HDL Ratio: 4.6 ratio (ref 0.0–5.0)
Cholesterol, Total: 196 mg/dL (ref 100–199)
Creatinine, Ser: 0.95 mg/dL (ref 0.76–1.27)
EOS (ABSOLUTE): 0.2 10*3/uL (ref 0.0–0.4)
Eos: 3 %
Estimated CHD Risk: 0.9 times avg. (ref 0.0–1.0)
Free Thyroxine Index: 1.5 (ref 1.2–4.9)
GGT: 35 IU/L (ref 0–65)
Globulin, Total: 1.7 g/dL (ref 1.5–4.5)
Glucose: 124 mg/dL — ABNORMAL HIGH (ref 70–99)
HDL: 43 mg/dL (ref >39)
Hematocrit: 46.3 % (ref 37.5–51.0)
Hemoglobin: 16.5 g/dL (ref 13.0–17.7)
Immature Grans (Abs): 0 10*3/uL (ref 0.0–0.1)
Immature Granulocytes: 0 %
Iron: 98 ug/dL (ref 38–169)
LDH: 198 IU/L (ref 121–224)
LDL Chol Calc (NIH): 106 mg/dL — ABNORMAL HIGH (ref 0–99)
Lymphocytes Absolute: 1.6 10*3/uL (ref 0.7–3.1)
Lymphs: 29 %
MCH: 30.1 pg (ref 26.6–33.0)
MCHC: 35.6 g/dL (ref 31.5–35.7)
MCV: 85 fL (ref 79–97)
Monocytes Absolute: 0.6 10*3/uL (ref 0.1–0.9)
Monocytes: 11 %
Neutrophils Absolute: 3.1 10*3/uL (ref 1.4–7.0)
Neutrophils: 56 %
Phosphorus: 2.6 mg/dL — ABNORMAL LOW (ref 2.8–4.1)
Platelets: 154 10*3/uL (ref 150–450)
Potassium: 4.4 mmol/L (ref 3.5–5.2)
Prostate Specific Ag, Serum: 0.8 ng/mL (ref 0.0–4.0)
RBC: 5.48 x10E6/uL (ref 4.14–5.80)
RDW: 12.9 % (ref 11.6–15.4)
Sodium: 137 mmol/L (ref 134–144)
T3 Uptake Ratio: 28 % (ref 24–39)
T4, Total: 5.3 ug/dL (ref 4.5–12.0)
TSH: 0.565 u[IU]/mL (ref 0.450–4.500)
Total Protein: 6.1 g/dL (ref 6.0–8.5)
Triglycerides: 273 mg/dL — ABNORMAL HIGH (ref 0–149)
Uric Acid: 4.9 mg/dL (ref 3.8–8.4)
VLDL Cholesterol Cal: 47 mg/dL — ABNORMAL HIGH (ref 5–40)
WBC: 5.6 10*3/uL (ref 3.4–10.8)
eGFR: 95 mL/min/{1.73_m2} (ref >59)

## 2022-01-13 ENCOUNTER — Ambulatory Visit: Payer: Self-pay | Admitting: Physician Assistant

## 2022-01-13 ENCOUNTER — Encounter: Payer: Self-pay | Admitting: Physician Assistant

## 2022-01-13 VITALS — BP 125/82 | HR 80 | Temp 98.0°F | Resp 12 | Ht 72.0 in | Wt 363.0 lb

## 2022-01-13 DIAGNOSIS — Z0000 Encounter for general adult medical examination without abnormal findings: Secondary | ICD-10-CM

## 2022-01-13 DIAGNOSIS — E782 Mixed hyperlipidemia: Secondary | ICD-10-CM

## 2022-01-13 NOTE — Progress Notes (Signed)
[facility]  ____________________________________________   None    (approximate)  I have reviewed the triage vital signs and the nursing notes.   HISTORY  Chief Complaint No chief complaint on file.    HPI Jeremy West is a 56 y.o. male patient presents for annual physical exam.  Patient states he reviewed his labs and findings his elevated lipids are secondary to decreased physical activities and weight gain.  Patient currently followed by [facility] orthopedic department for osteoarthritis of the right knee.  Past medical history markable for GERD, hypertension, morbid obesity, and sleep apnea.     Past Medical History:  Diagnosis Date   Dilated aortic root (HCC)    a. 04/2017 Echo: Mod dil Ao root @ 44 mm.   GERD (gastroesophageal reflux disease)    History of echocardiogram    a. 04/2017 Echo: EF 55-60%, Mod dil Ao root @ 44 mm.  mildly dil LA.   Hypertension    Morbid obesity (HCC)    OA (osteoarthritis)    PAF (paroxysmal atrial fibrillation) (HCC)    a. Seen in 01/2010; b. CHA2DS2VASc = 1.   Rheumatoid arthritis (HCC)    Sleep apnea    Tubular adenoma of colon 03/12/2015   Villous adenoma of colon 03/12/2015    Patient Active Problem List   Diagnosis Date Noted   Osteoarthritis of knees, bilateral 01/09/2022   Depression 01/09/2022   Osteoarthritis of ankle 01/09/2022   Encounter for other preprocedural examination 01/09/2022   Fatty (change of) liver, not elsewhere classified 01/09/2022   Injury of right rotator cuff 01/09/2022   Irritability and anger 01/09/2022   Nonalcoholic steatohepatitis (NASH) 01/09/2022   Other specified health status 01/09/2022   Pain in right knee 01/09/2022   Polyp of colon 01/09/2022   Acute cholecystitis 11/08/2019   Gastroesophageal reflux disease 03/10/2019   Cough 03/10/2019   Dilated aortic root (HCC) 03/10/2019   IFG (impaired fasting glucose) 03/10/2019   History of colonic polyps  03/10/2019   Chest pain 05/22/2017   Shortness of breath 05/22/2017   Atrial fibrillation (HCC) 10/31/2013   Sleep apnea 10/31/2013   Obesity 10/31/2013   Hypertension 10/31/2013   Seropositive rheumatoid arthritis (HCC) 10/31/2013   Abnormal transaminases 10/31/2013    Past Surgical History:  Procedure Laterality Date   ARTHROTOMY N/A    CHOLECYSTECTOMY  May or June 2021   COLONOSCOPY WITH PROPOFOL N/A 03/12/2015   Procedure: COLONOSCOPY WITH PROPOFOL;  Surgeon: Scot Jun, MD;  Location: [facility] ENDOSCOPY;  Service: Endoscopy;  Laterality: N/A;   complex repair leg Right    ESOPHAGOGASTRODUODENOSCOPY (EGD) WITH PROPOFOL N/A 03/12/2015   Procedure: ESOPHAGOGASTRODUODENOSCOPY (EGD) WITH PROPOFOL;  Surgeon: Scot Jun, MD;  Location: [facility] ENDOSCOPY;  Service: Endoscopy;  Laterality: N/A;   ROTATOR CUFF REPAIR Right 02/09/2020   [facility]    Prior to Admission medications   Medication Sig Start Date End Date Taking? Authorizing Provider  acetaminophen (TYLENOL) 650 MG CR tablet Take 650 mg by mouth every 8 (eight) hours as needed for pain.    [provider]  diltiazem (TIAZAC) 360 MG 24 hr capsule Take 1 tablet by mouth daily. 10/31/20   [provider]  hydroxychloroquine (PLAQUENIL) 200 MG tablet Take 400 mg by mouth daily.  02/05/17   [provider]  predniSONE (DELTASONE) 20 MG tablet TAKE THREE TABLETS BY MOUTH EVERY DAY FOR 5 DAYS, THEN TAKE TWO TABLETS EVERY DAY FOR 5 DAYS, THEN  TAKE ONE AND ONE-HALF TABLETS EVERY DAY FOR 5 DAYS, THEN TAKE ONE TABLET EVERY DAY FOR 3 DAYS 08/26/21   [provider]  predniSONE (DELTASONE) 5 MG tablet TAKE SIX TABLETS BY MOUTH ONCE EVERY DAY FOR 2 DAYS, CONTINUE TAKING ONE LESS TABLET EVERY 2 DAYS UNTIL TAKING 1 TABLET DAILY FOR 2 DAYS AS DIRECTED WITH FOOD. 12/05/21   [provider]  sarilumab (KEVZARA) 200 MG/1. SOSY INJECT 200MG  (1 PEN) UNDER SKIN EVERY OTHER WEEK FOR RHEUMATOID  ARTHRITIS - REPLACES ABATACEPT 09/17/20   [provider]    Allergies Adalimumab, Penicillin g, and Penicillins  Family History  Problem Relation Age of Onset   Bone cancer Mother        died in his 46s   Lung cancer Father        died in his 46s   Thyroid cancer Sister     Social History Social History   Tobacco Use   Smoking status: Never   Smokeless tobacco: Former    Quit date: 1995  Building services engineer Use: Never used  Substance Use Topics   Alcohol use: Yes    Alcohol/week: 1.0 standard drink of alcohol    Types: 1 Cans of beer per week    Comment: rare   Drug use: No    Review of Systems Constitutional: No fever/chills Eyes: No visual changes. ENT: No sore throat. Cardiovascular: Denies chest pain.  PAF Respiratory: Denies shortness of breath. Gastrointestinal: No abdominal pain.  No nausea, no vomiting.  No diarrhea.  No constipation.  GERD Genitourinary: Negative for dysuria. Musculoskeletal: Negative for back pain. Skin: Negative for rash. Neurological: Negative for headaches, focal weakness or numbness. Psychiatric: Depression Endocrine: Hypertension Hematological/Lymphatic:  Allergic/Immunilogical: Adalimumab and penicillin  ____________________________________________   PHYSICAL EXAM:  VITAL SIGNS: BP is 125/82, pulse 80, respiration 12, temperature 98, and patient 90% O2 sat on room air.  Patient weighs 363 pounds.  BMI is 49.23. Constitutional: Alert and oriented. Well appearing and in no acute distress.  14 pound weight gain since since last physical. Eyes: Conjunctivae are normal. PERRL. EOMI. Head: Atraumatic. Nose: No congestion/rhinnorhea. Mouth/Throat: Mucous membranes are moist.  Oropharynx non-erythematous. Neck: No stridor.  No cervical spine tenderness to palpation. Hematological/Lymphatic/Immunilogical: No cervical lymphadenopathy. Cardiovascular: Normal rate, regular rhythm. Grossly normal heart sounds.  Good peripheral  circulation. Respiratory: Normal respiratory effort.  No retractions. Lungs CTAB. Gastrointestinal: Soft and nontender.  Abdominal distention secondary to body habitus. No abdominal bruits. No CVA tenderness. Genitourinary: Deferred Musculoskeletal: No lower extremity tenderness nor edema.  No joint effusions.  Mild crepitus with palpation of the right knee. Neurologic:  Normal speech and language. No gross focal neurologic deficits are appreciated. No gait instability. Skin:  Skin is warm, dry and intact. No rash noted. Psychiatric: Mood and affect are normal. Speech and behavior are normal.  ____________________________________________   LABS  ____________________________________________         Component Ref Range & Units 3 d ago 1 yr ago 2 yr ago  Color, UA  Light Yellow  yellow  Amber   Clarity, UA  Clear  cloudy  Clear   Glucose, UA Negative Negative  Negative  Negative   Bilirubin, UA  Negative  negative  Negative   Ketones, UA  Negative  negative  Negative   Spec Grav, UA 1.010 - 1.025 1.015  >=1.030 Abnormal   >=1.030 Abnormal  CM   Blood, UA  Negative  negative  Negative   pH,  UA 5.0 - 8.0 6.0  5.5  5.5   Protein, UA Negative Negative  Positive Abnormal  CM  Negative   Urobilinogen, UA 0.2 or 1.0 E.U./dL 0.2  0.2  0.2   Nitrite, UA  Negative  negative  Negative   Leukocytes, UA Negative Negative  Negative  Negative   Appearance   dark     Odor                          Contains abnormal data CMP12+LP+TP+TSH+6AC+PSA+CBC. Order: [id number] Status: Final result    Visible to patient: Yes (not seen)    Next appt: 10/29/2022 at 08:30 AM in [facility] ([facility] N[facility])    Dx: Routine adult health maintenance    0 Result Notes            Component Ref Range & Units 3 d ago (01/10/22) 1 yr ago (04/17/20) 2 yr ago (11/08/19) 2 yr ago (11/08/19) 2 yr ago (11/08/19) 2 yr ago (09/20/19) 2 yr ago (09/20/19) 2 yr ago (09/20/19)  Glucose 70 - 99 mg/dL 119 High   147 High  R   120  High  CM    147 High  R    Uric Acid 3.8 - 8.4 mg/dL 4.9  4.5 CM         Comment:            Therapeutic target for gout patients: <6.0  BUN 6 - 24 mg/dL 11  13   10  R    8    Creatinine, Ser 0.76 - 1.27 mg/dL 8.29  5.62   1.30 R    0.91    eGFR >59 mL/min/1.73 95          BUN/Creatinine Ratio 9 - 20 12  13      9     Sodium 134 - 144 mmol/L 137  141   136 R    137    Potassium 3.5 - 5.2 mmol/L 4.4  5.0   4.1 R    4.4    Chloride 96 - 106 mmol/L 101  102   103 R    104    Calcium 8.7 - 10.2 mg/dL 9.3  9.7   9.1 R    9.0    Phosphorus 2.8 - 4.1 mg/dL 2.6 Low   3.2      3.0    Total Protein 6.0 - 8.5 g/dL 6.1  7.2  6.9 R      6.5   Albumin 3.8 - 4.9 g/dL 4.4  4.5  3.8 R     4.2    Globulin, Total 1.5 - 4.5 g/dL 1.7  2.7         Albumin/Globulin Ratio 1.2 - 2.2 2.6 High   1.7         Bilirubin Total 0.0 - 1.2 mg/dL 3.2 High   0.8  1.7 High  R      1.2   Comment: Note: Specimen is icteric.  Alkaline Phosphatase 44 - 121 IU/L 52  78 CM  64 R      79 R   LDH 121 - 224 IU/L 198  179         AST 0 - 40 IU/L 33  16  27 R      43 High    ALT 0 - 44 IU/L 62 High   19  38 R      54 High  GGT 0 - 65 IU/L 35  30         Iron 38 - 169 ug/dL 98  57         Cholesterol, Total 100 - 199 mg/dL 528  413         Triglycerides 0 - 149 mg/dL 244 High   95         HDL >39 mg/dL 43  49         VLDL Cholesterol Cal 5 - 40 mg/dL 47 High   17         LDL Chol Calc (NIH) 0 - 99 mg/dL 010 High   272 High          Chol/HDL Ratio 0.0 - 5.0 ratio 4.6  3.7 CM         Comment:                                   T. Chol/HDL Ratio                                              Men  Women                                1/2 Avg.Risk  3.4    3.3                                    Avg.Risk  5.0    4.4                                 2X Avg.Risk  9.6    7.1                                 3X Avg.Risk 23.4   11.0   Estimated CHD Risk 0.0 - 1.0 times avg. 0.9  0.6 CM         Comment: The CHD Risk is based on the T. Chol/HDL  ratio. Other  factors affect CHD Risk such as hypertension, smoking,  diabetes, severe obesity, and family history of  premature CHD.   TSH 0.450 - 4.500 uIU/mL 0.565  0.198 Low          T4, Total 4.5 - 12.0 ug/dL 5.3  6.8         T3 Uptake Ratio 24 - 39 % 28  23 Low          Free Thyroxine Index 1.2 - 4.9 1.5  1.6         Prostate Specific Ag, Serum 0.0 - 4.0 ng/mL 0.8  0.9 CM         Comment: Roche ECLIA methodology.  According to the American Urological Association, Serum PSA should  decrease and remain at undetectable levels after radical  prostatectomy. The AUA defines biochemical recurrence as an initial  PSA value 0.2 ng/mL or greater followed by a subsequent confirmatory  PSA value 0.2 ng/mL or greater.  Values obtained with different assay methods or kits cannot be used  interchangeably. Results cannot be interpreted as absolute evidence  of the presence or absence of malignant disease.   WBC 3.4 - 10.8 x10E3/uL 5.6  9.3    8.5 R  5.8     RBC 4.14 - 5.80 x10E6/uL 5.48  5.64    5.40 R  5.52     Hemoglobin 13.0 - 17.7 g/dL 95.1  88.4    16.6 R  06.3     Hematocrit 37.5 - 51.0 % 46.3  45.0    43.3 R  42.3     MCV 79 - 97 fL 85  80    80.2 R  77 Low      MCH 26.6 - 33.0 pg 30.1  27.0    26.1 R  26.6     MCHC 31.5 - 35.7 g/dL 01.6  01.0    93.2 R  35.5     RDW 11.6 - 15.4 % 12.9  14.8    15.0 R  14.9     Platelets 150 - 450 x10E3/uL 154  337    227 R  241     Neutrophils Not Estab. % 56  79     71     Lymphs Not Estab. % 29  13     19      Monocytes Not Estab. % 11  8     7      Eos Not Estab. % 3  0     3     Basos Not Estab. % 1  0     0     Neutrophils Absolute 1.4 - 7.0 x10E3/uL 3.1  7.3 High      4.2     Lymphocytes Absolute 0.7 - 3.1 x10E3/uL 1.6  1.2     1.1     Monocytes Absolute 0.1 - 0.9 x10E3/uL 0.6  0.7     0.4     EOS (ABSOLUTE) 0.0 - 0.4 x10E3/uL 0.2  0.0     0.2     Basophils Absolute 0.0 - 0.2 x10E3/uL 0.0  0.0     0.0     Immature Granulocytes Not Estab. % 0  0      0     Immature Grans (                ____________________________________________   ____________________________________________   INITIAL IMPRESSION / ASSESSMENT AND PLAN  As part of my medical decision making, I reviewed the following data within the electronic MEDICAL RECORD NUMBER      Discussed lab results with patient.  There is increase of the patient's cholesterol, triglycerides and decreased of his HDLs.  Patient is not amenable to start medication at this time he states lifestyle changes consist of increased exercise and diet.  Patient will follow-up in 3 months with fasting labs.     ____________________________________________   FINAL CLINICAL IMPRESSION Well exam   ED Discharge Orders          Ordered    EKG 12-Lead        01/13/22 1314             Note:  This document was prepared using Dragon voice recognition software and may include unintentional dictation errors.

## 2022-01-14 DIAGNOSIS — M62512 Muscle wasting and atrophy, not elsewhere classified, left shoulder: Secondary | ICD-10-CM | POA: Diagnosis not present

## 2022-01-14 DIAGNOSIS — M75122 Complete rotator cuff tear or rupture of left shoulder, not specified as traumatic: Secondary | ICD-10-CM | POA: Diagnosis not present

## 2022-01-14 DIAGNOSIS — M25512 Pain in left shoulder: Secondary | ICD-10-CM | POA: Diagnosis not present

## 2022-01-14 DIAGNOSIS — M25412 Effusion, left shoulder: Secondary | ICD-10-CM | POA: Diagnosis not present

## 2022-01-14 DIAGNOSIS — M7542 Impingement syndrome of left shoulder: Secondary | ICD-10-CM | POA: Diagnosis not present

## 2022-01-14 LAB — SPECIMEN STATUS REPORT

## 2022-01-14 LAB — HGB A1C W/O EAG: Hgb A1c MFr Bld: 6.5 % — ABNORMAL HIGH (ref 4.8–5.6)

## 2022-01-27 ENCOUNTER — Ambulatory Visit: Payer: Self-pay | Admitting: Physician Assistant

## 2022-01-27 ENCOUNTER — Encounter: Payer: Self-pay | Admitting: Physician Assistant

## 2022-01-27 VITALS — BP 146/96 | HR 92 | Temp 97.7°F | Resp 16 | Ht 72.0 in | Wt 360.0 lb

## 2022-01-27 DIAGNOSIS — M25561 Pain in right knee: Secondary | ICD-10-CM

## 2022-01-27 NOTE — Progress Notes (Signed)
Pt presents today for workers comp follow up for return to work note.

## 2022-01-27 NOTE — Progress Notes (Signed)
Subjective: Right knee pain    Patient ID: Jeremy West, male    DOB: 1966/05/12, 56 y.o.   MRN: [id number]  HPI Patient follow-up for right knee pain secondary to a twisting incident which occurred on 12/31/2021.  Patient was diagnosed with a knee effusion and went to physical therapy.  Patient was released from physical therapy 3 days ago and request return   Review of Systems Negative septa chief complaint    Objective:   Physical Exam No acute distress.  Ambulates with normal gait. BP is 146/96, pulse 92, respirations 16, temp 97.7, patient 95% O2 sat on room air.  Patient weighs 260 pounds and BMI is 40.82. Examination of the right knee shows no obvious deformity, there is no real edema, or erythema.  No crepitus palpation.  Patient has full Nikkel range of motion.  Patient strength against resistance 5/5.      Assessment & Plan: Knee effusion  Patient return back to full duty.  Advised to follow-up as necessary.

## 2022-01-28 DIAGNOSIS — Z23 Encounter for immunization: Secondary | ICD-10-CM | POA: Diagnosis not present

## 2022-01-28 DIAGNOSIS — K719 Toxic liver disease, unspecified: Secondary | ICD-10-CM | POA: Diagnosis not present

## 2022-01-28 DIAGNOSIS — K760 Fatty (change of) liver, not elsewhere classified: Secondary | ICD-10-CM | POA: Diagnosis not present

## 2022-01-28 DIAGNOSIS — T451X5A Adverse effect of antineoplastic and immunosuppressive drugs, initial encounter: Secondary | ICD-10-CM | POA: Diagnosis not present

## 2022-03-12 DIAGNOSIS — M75122 Complete rotator cuff tear or rupture of left shoulder, not specified as traumatic: Secondary | ICD-10-CM | POA: Diagnosis not present

## 2022-03-13 DIAGNOSIS — M25512 Pain in left shoulder: Secondary | ICD-10-CM | POA: Diagnosis not present

## 2022-03-28 DIAGNOSIS — M25512 Pain in left shoulder: Secondary | ICD-10-CM | POA: Diagnosis not present

## 2022-04-04 DIAGNOSIS — M25512 Pain in left shoulder: Secondary | ICD-10-CM | POA: Diagnosis not present

## 2022-04-10 ENCOUNTER — Other Ambulatory Visit: Payer: No Typology Code available for payment source

## 2022-04-11 DIAGNOSIS — M25512 Pain in left shoulder: Secondary | ICD-10-CM | POA: Diagnosis not present

## 2022-04-15 ENCOUNTER — Other Ambulatory Visit: Payer: Self-pay

## 2022-04-15 DIAGNOSIS — E782 Mixed hyperlipidemia: Secondary | ICD-10-CM

## 2022-04-15 NOTE — Progress Notes (Signed)
Pt presents today for 3 month lipid labs.

## 2022-04-16 LAB — LIPID PANEL
Chol/HDL Ratio: 5 ratio (ref 0.0–5.0)
Cholesterol, Total: 195 mg/dL (ref 100–199)
HDL: 39 mg/dL — ABNORMAL LOW (ref >39)
LDL Chol Calc (NIH): 118 mg/dL — ABNORMAL HIGH (ref 0–99)
Triglycerides: 213 mg/dL — ABNORMAL HIGH (ref 0–149)
VLDL Cholesterol Cal: 38 mg/dL (ref 5–40)

## 2022-04-23 DIAGNOSIS — M75122 Complete rotator cuff tear or rupture of left shoulder, not specified as traumatic: Secondary | ICD-10-CM | POA: Diagnosis not present

## 2022-04-28 DIAGNOSIS — Z0389 Encounter for observation for other suspected diseases and conditions ruled out: Secondary | ICD-10-CM | POA: Diagnosis not present

## 2022-04-28 DIAGNOSIS — M79651 Pain in right thigh: Secondary | ICD-10-CM | POA: Diagnosis not present

## 2022-05-13 DIAGNOSIS — M25512 Pain in left shoulder: Secondary | ICD-10-CM | POA: Diagnosis not present

## 2022-05-13 DIAGNOSIS — M75102 Unspecified rotator cuff tear or rupture of left shoulder, not specified as traumatic: Secondary | ICD-10-CM | POA: Diagnosis not present

## 2022-05-13 DIAGNOSIS — G8929 Other chronic pain: Secondary | ICD-10-CM | POA: Diagnosis not present

## 2022-06-11 DIAGNOSIS — M75102 Unspecified rotator cuff tear or rupture of left shoulder, not specified as traumatic: Secondary | ICD-10-CM | POA: Diagnosis not present

## 2022-06-16 ENCOUNTER — Ambulatory Visit: Payer: Self-pay | Admitting: Physician Assistant

## 2022-06-16 ENCOUNTER — Encounter: Payer: Self-pay | Admitting: Physician Assistant

## 2022-06-16 DIAGNOSIS — M25561 Pain in right knee: Secondary | ICD-10-CM

## 2022-06-16 MED ORDER — METHYLPREDNISOLONE 4 MG PO TBPK: ORAL_TABLET | ORAL | 0 refills | Status: DC

## 2022-06-16 NOTE — Progress Notes (Signed)
Subjective: Right knee pain    Patient ID: Jeremy West, male    DOB: Apr 21, 1966, 56 y.o.   MRN: [id number]  HPI Patient complains of right knee pain secondary to degenerative joint disease.  [facility]  States mild effusion but most concerned secondary to pain after prolonged sitting.  Patient states when he tries to get up to walk after prolonged sitting takes 3 to 5 minutes.  Knee feels stable enough to bear weight.  Patient will follow-up [facility].  Request medication for swelling and pain.   Review of Systems A-fib, GERD, nonalcoholic fatty liver, hypertension, and sleep apnea.    Objective:   Physical Exam See nurses notes for vital signs. No obvious deformity to the left knee.  Patient is wearing a hinged brace.  Brace shows mild effusion but no erythema.  Mild crepitus with palpation.       Assessment & Plan: DJD right knee with mild effusion   Patient given a Medrol Dosepak and advised to follow-up orthopedics at the [facility] as soon as possible.

## 2022-06-16 NOTE — Progress Notes (Signed)
Pt presents today with ongoing knee pain since last Monday. The most pain is when standing after sitting for a while then as he walks it eases off. Pt does have hx of arthritis flare up and or fluid.

## 2022-06-19 DIAGNOSIS — Z6841 Body Mass Index (BMI) 40.0 and over, adult: Secondary | ICD-10-CM | POA: Diagnosis not present

## 2022-06-19 DIAGNOSIS — Z7901 Long term (current) use of anticoagulants: Secondary | ICD-10-CM | POA: Diagnosis not present

## 2022-06-19 DIAGNOSIS — I480 Paroxysmal atrial fibrillation: Secondary | ICD-10-CM | POA: Diagnosis not present

## 2022-06-19 DIAGNOSIS — Z9049 Acquired absence of other specified parts of digestive tract: Secondary | ICD-10-CM | POA: Diagnosis not present

## 2022-06-19 DIAGNOSIS — I10 Essential (primary) hypertension: Secondary | ICD-10-CM | POA: Diagnosis not present

## 2022-06-19 DIAGNOSIS — Z87891 Personal history of nicotine dependence: Secondary | ICD-10-CM | POA: Diagnosis not present

## 2022-06-19 DIAGNOSIS — M25312 Other instability, left shoulder: Secondary | ICD-10-CM | POA: Diagnosis not present

## 2022-06-19 DIAGNOSIS — Z79899 Other long term (current) drug therapy: Secondary | ICD-10-CM | POA: Diagnosis not present

## 2022-06-19 DIAGNOSIS — G4730 Sleep apnea, unspecified: Secondary | ICD-10-CM | POA: Diagnosis not present

## 2022-06-19 DIAGNOSIS — M75112 Incomplete rotator cuff tear or rupture of left shoulder, not specified as traumatic: Secondary | ICD-10-CM | POA: Diagnosis not present

## 2022-06-19 DIAGNOSIS — Z9889 Other specified postprocedural states: Secondary | ICD-10-CM | POA: Diagnosis not present

## 2022-06-19 DIAGNOSIS — M069 Rheumatoid arthritis, unspecified: Secondary | ICD-10-CM | POA: Diagnosis not present

## 2022-06-19 DIAGNOSIS — G8929 Other chronic pain: Secondary | ICD-10-CM | POA: Diagnosis not present

## 2022-06-24 DIAGNOSIS — R29898 Other symptoms and signs involving the musculoskeletal system: Secondary | ICD-10-CM | POA: Diagnosis not present

## 2022-06-24 DIAGNOSIS — Z4789 Encounter for other orthopedic aftercare: Secondary | ICD-10-CM | POA: Diagnosis not present

## 2022-06-26 ENCOUNTER — Ambulatory Visit: Payer: No Typology Code available for payment source | Admitting: Physician Assistant

## 2022-06-30 ENCOUNTER — Ambulatory Visit: Payer: No Typology Code available for payment source | Admitting: Physician Assistant

## 2022-07-02 DIAGNOSIS — R29898 Other symptoms and signs involving the musculoskeletal system: Secondary | ICD-10-CM | POA: Diagnosis not present

## 2022-07-02 DIAGNOSIS — Z4789 Encounter for other orthopedic aftercare: Secondary | ICD-10-CM | POA: Diagnosis not present

## 2022-07-09 ENCOUNTER — Ambulatory Visit: Payer: Self-pay | Admitting: Physician Assistant

## 2022-07-09 ENCOUNTER — Encounter: Payer: Self-pay | Admitting: Physician Assistant

## 2022-07-09 VITALS — BP 144/95 | HR 74 | Temp 97.3°F | Resp 16

## 2022-07-09 DIAGNOSIS — M25462 Effusion, left knee: Secondary | ICD-10-CM

## 2022-07-09 NOTE — Progress Notes (Signed)
Subjective: Right knee effusion    Patient ID: Jeremy West, male    DOB: 08-Jun-1966, 56 y.o.   MRN: [id number]  HPI Patient complains of edema to bilateral knees right greater than left.  Patient states he has had 105 cc of fluid drained from the right knee 2 weeks ago.  Patient states the last 2 days he has had increased knee edema and difficulty ambulating.  Recent left shoulder surgery secondary to rotator cuff injury.  History of osteoarthritis of bilateral knee.   Review of Systems A-fib, hypertension, GERD, and obesity.    Objective:   Physical Exam  BP is 144/95, pulse 74, respirations 16, temperature 97.3, patient 97% O2 sat on room air.  Patient ambulates with atypical gait.  Examination of the knees shows bilateral knee effusion.      Assessment & Plan: Bilateral knee effusion   Advised patient to [facility]- in [facility] for further evaluation.

## 2022-07-09 NOTE — Progress Notes (Signed)
2 weeks ago had 105 cc of fluid removed from right knee by [facility].  Had rotator cuff surgery 4 weeks ago & has been sitting more over  this time.  No known injury.  States usually has to have fluid drained 1-2 x per year because of the arthritis.  It doesn't usually come back this soon after having it removed.  States feels like knee so swollen, it's hard to walk.  It's hard to bend my knees.  AMD

## 2022-07-10 DIAGNOSIS — R29898 Other symptoms and signs involving the musculoskeletal system: Secondary | ICD-10-CM | POA: Diagnosis not present

## 2022-07-10 DIAGNOSIS — Z4789 Encounter for other orthopedic aftercare: Secondary | ICD-10-CM | POA: Diagnosis not present

## 2022-07-15 ENCOUNTER — Other Ambulatory Visit
Admission: RE | Admit: 2022-07-15 | Discharge: 2022-07-15 | Disposition: A | Discharge status: Home / Self Care | Payer: 59 | Source: Ambulatory Visit | Attending: Sports Medicine | Admitting: Sports Medicine

## 2022-07-15 DIAGNOSIS — M25462 Effusion, left knee: Secondary | ICD-10-CM | POA: Insufficient documentation

## 2022-07-15 DIAGNOSIS — M25562 Pain in left knee: Secondary | ICD-10-CM | POA: Diagnosis not present

## 2022-07-15 DIAGNOSIS — M1731 Unilateral post-traumatic osteoarthritis, right knee: Secondary | ICD-10-CM | POA: Diagnosis not present

## 2022-07-15 DIAGNOSIS — M25561 Pain in right knee: Secondary | ICD-10-CM | POA: Insufficient documentation

## 2022-07-15 DIAGNOSIS — M1712 Unilateral primary osteoarthritis, left knee: Secondary | ICD-10-CM | POA: Diagnosis not present

## 2022-07-15 DIAGNOSIS — M25461 Effusion, right knee: Secondary | ICD-10-CM | POA: Diagnosis not present

## 2022-07-15 DIAGNOSIS — G8929 Other chronic pain: Secondary | ICD-10-CM | POA: Insufficient documentation

## 2022-07-15 LAB — SYNOVIAL CELL COUNT + DIFF, W/ CRYSTALS
Crystals, Fluid: NONE SEEN
Eosinophils-Synovial: 0 %
Lymphocytes-Synovial Fld: 52 %
Monocyte-Macrophage-Synovial Fluid: 11 %
Neutrophil, Synovial: 37 %
WBC, Synovial: 4796 /mm3 — ABNORMAL HIGH (ref 0–200)

## 2022-07-17 DIAGNOSIS — M25761 Osteophyte, right knee: Secondary | ICD-10-CM | POA: Diagnosis not present

## 2022-07-17 DIAGNOSIS — M1711 Unilateral primary osteoarthritis, right knee: Secondary | ICD-10-CM | POA: Diagnosis not present

## 2022-07-17 DIAGNOSIS — Z87828 Personal history of other (healed) physical injury and trauma: Secondary | ICD-10-CM | POA: Diagnosis not present

## 2022-07-17 DIAGNOSIS — M25461 Effusion, right knee: Secondary | ICD-10-CM | POA: Diagnosis not present

## 2022-07-18 LAB — AEROBIC CULTURE W GRAM STAIN (SUPERFICIAL SPECIMEN): Culture: NO GROWTH

## 2022-07-24 DIAGNOSIS — Z4789 Encounter for other orthopedic aftercare: Secondary | ICD-10-CM | POA: Diagnosis not present

## 2022-07-24 DIAGNOSIS — R29898 Other symptoms and signs involving the musculoskeletal system: Secondary | ICD-10-CM | POA: Diagnosis not present

## 2022-07-30 ENCOUNTER — Encounter: Payer: Self-pay | Admitting: Orthopedic Surgery

## 2022-07-31 DIAGNOSIS — Z0189 Encounter for other specified special examinations: Secondary | ICD-10-CM | POA: Diagnosis not present

## 2022-08-13 DIAGNOSIS — Z4789 Encounter for other orthopedic aftercare: Secondary | ICD-10-CM | POA: Diagnosis not present

## 2022-08-13 DIAGNOSIS — R29898 Other symptoms and signs involving the musculoskeletal system: Secondary | ICD-10-CM | POA: Diagnosis not present

## 2022-08-14 ENCOUNTER — Other Ambulatory Visit: Payer: Self-pay

## 2022-08-14 DIAGNOSIS — R7309 Other abnormal glucose: Secondary | ICD-10-CM

## 2022-08-14 DIAGNOSIS — Z0000 Encounter for general adult medical examination without abnormal findings: Secondary | ICD-10-CM

## 2022-08-14 NOTE — Addendum Note (Signed)
Addended by: Aliene Altes on: 08/14/2022 09:32 AM   Modules accepted: Orders

## 2022-08-14 NOTE — Progress Notes (Signed)
Changing to [place] system in [place] a screening physician (Dr. Ok Edwards) who facilitates getting him to appropriate specialists. When he had labs, he wasn't fasting & glucose was 300+ & A1c was 8.+  Rechecking glucose & A1c  Will fax results to Dr. Ok Edwards at [phone]  AMD

## 2022-08-15 LAB — GLUCOSE, RANDOM: Glucose: 197 mg/dL — ABNORMAL HIGH (ref 70–99)

## 2022-08-15 LAB — HGB A1C W/O EAG: Hgb A1c MFr Bld: 8.6 % — ABNORMAL HIGH (ref 4.8–5.6)

## 2022-08-15 NOTE — Addendum Note (Signed)
Addended by: Aliene Altes on: 08/15/2022 11:57 AM   Modules accepted: Orders

## 2022-08-29 DIAGNOSIS — I4891 Unspecified atrial fibrillation: Secondary | ICD-10-CM | POA: Diagnosis not present

## 2022-08-29 DIAGNOSIS — M059 Rheumatoid arthritis with rheumatoid factor, unspecified: Secondary | ICD-10-CM | POA: Diagnosis not present

## 2022-08-29 DIAGNOSIS — R739 Hyperglycemia, unspecified: Secondary | ICD-10-CM | POA: Diagnosis not present

## 2022-09-04 DIAGNOSIS — M069 Rheumatoid arthritis, unspecified: Secondary | ICD-10-CM | POA: Diagnosis not present

## 2022-09-05 DIAGNOSIS — M069 Rheumatoid arthritis, unspecified: Secondary | ICD-10-CM | POA: Diagnosis not present

## 2022-10-01 DIAGNOSIS — R29898 Other symptoms and signs involving the musculoskeletal system: Secondary | ICD-10-CM | POA: Diagnosis not present

## 2022-10-01 DIAGNOSIS — Z4789 Encounter for other orthopedic aftercare: Secondary | ICD-10-CM | POA: Diagnosis not present

## 2022-10-14 ENCOUNTER — Other Ambulatory Visit: Payer: Self-pay | Admitting: Physician Assistant

## 2022-10-14 MED ORDER — METHYLPREDNISOLONE 4 MG PO TBPK: ORAL_TABLET | ORAL | 0 refills | Status: DC

## 2022-10-22 DIAGNOSIS — Z4789 Encounter for other orthopedic aftercare: Secondary | ICD-10-CM | POA: Diagnosis not present

## 2022-10-22 DIAGNOSIS — R29898 Other symptoms and signs involving the musculoskeletal system: Secondary | ICD-10-CM | POA: Diagnosis not present

## 2022-11-05 ENCOUNTER — Encounter: Payer: No Typology Code available for payment source | Admitting: Physician Assistant

## 2022-11-06 DIAGNOSIS — Z4789 Encounter for other orthopedic aftercare: Secondary | ICD-10-CM | POA: Diagnosis not present

## 2022-11-06 DIAGNOSIS — R29898 Other symptoms and signs involving the musculoskeletal system: Secondary | ICD-10-CM | POA: Diagnosis not present

## 2022-11-17 DIAGNOSIS — M059 Rheumatoid arthritis with rheumatoid factor, unspecified: Secondary | ICD-10-CM | POA: Diagnosis not present

## 2022-11-17 DIAGNOSIS — M25462 Effusion, left knee: Secondary | ICD-10-CM | POA: Diagnosis not present

## 2022-11-17 DIAGNOSIS — Z7952 Long term (current) use of systemic steroids: Secondary | ICD-10-CM | POA: Diagnosis not present

## 2022-11-17 DIAGNOSIS — R739 Hyperglycemia, unspecified: Secondary | ICD-10-CM | POA: Diagnosis not present

## 2022-11-17 DIAGNOSIS — M25461 Effusion, right knee: Secondary | ICD-10-CM | POA: Diagnosis not present

## 2022-11-17 DIAGNOSIS — R030 Elevated blood-pressure reading, without diagnosis of hypertension: Secondary | ICD-10-CM | POA: Diagnosis not present

## 2022-11-17 DIAGNOSIS — I4891 Unspecified atrial fibrillation: Secondary | ICD-10-CM | POA: Diagnosis not present

## 2022-11-17 DIAGNOSIS — Z0189 Encounter for other specified special examinations: Secondary | ICD-10-CM | POA: Diagnosis not present

## 2022-11-20 ENCOUNTER — Other Ambulatory Visit: Payer: Self-pay

## 2022-11-20 DIAGNOSIS — D649 Anemia, unspecified: Secondary | ICD-10-CM

## 2022-11-20 NOTE — Progress Notes (Signed)
Pt completed labs for [facility]

## 2022-11-21 LAB — CBC WITH DIFFERENTIAL/PLATELET
Basophils Absolute: 0 10*3/uL (ref 0.0–0.2)
Basos: 0 %
EOS (ABSOLUTE): 0 10*3/uL (ref 0.0–0.4)
Eos: 1 %
Hematocrit: 44.2 % (ref 37.5–51.0)
Hemoglobin: 14.5 g/dL (ref 13.0–17.7)
Immature Grans (Abs): 0 10*3/uL (ref 0.0–0.1)
Immature Granulocytes: 0 %
Lymphocytes Absolute: 2.3 10*3/uL (ref 0.7–3.1)
Lymphs: 27 %
MCH: 26.2 pg — ABNORMAL LOW (ref 26.6–33.0)
MCHC: 32.8 g/dL (ref 31.5–35.7)
MCV: 80 fL (ref 79–97)
Monocytes Absolute: 0.6 10*3/uL (ref 0.1–0.9)
Monocytes: 8 %
Neutrophils Absolute: 5.5 10*3/uL (ref 1.4–7.0)
Neutrophils: 64 %
Platelets: 357 10*3/uL (ref 150–450)
RBC: 5.54 x10E6/uL (ref 4.14–5.80)
RDW: 13.7 % (ref 11.6–15.4)
WBC: 8.5 10*3/uL (ref 3.4–10.8)

## 2022-12-09 DIAGNOSIS — Z9889 Other specified postprocedural states: Secondary | ICD-10-CM | POA: Diagnosis not present

## 2022-12-09 DIAGNOSIS — M75102 Unspecified rotator cuff tear or rupture of left shoulder, not specified as traumatic: Secondary | ICD-10-CM | POA: Diagnosis not present

## 2022-12-23 ENCOUNTER — Encounter: Payer: No Typology Code available for payment source | Admitting: Physician Assistant

## 2022-12-23 ENCOUNTER — Ambulatory Visit: Payer: Self-pay

## 2022-12-23 DIAGNOSIS — Z0000 Encounter for general adult medical examination without abnormal findings: Secondary | ICD-10-CM

## 2022-12-23 LAB — POCT URINALYSIS DIPSTICK
Bilirubin, UA: NEGATIVE
Blood, UA: NEGATIVE
Glucose, UA: NEGATIVE
Ketones, UA: NEGATIVE
Leukocytes, UA: NEGATIVE
Nitrite, UA: NEGATIVE
Protein, UA: NEGATIVE
Spec Grav, UA: 1.02 (ref 1.010–1.025)
Urobilinogen, UA: 0.2 E.U./dL
pH, UA: 6 (ref 5.0–8.0)

## 2022-12-23 NOTE — Progress Notes (Signed)
Pt completed labs for physical. /CL,RMA 

## 2022-12-24 DIAGNOSIS — M25421 Effusion, right elbow: Secondary | ICD-10-CM | POA: Diagnosis not present

## 2022-12-24 DIAGNOSIS — Z79899 Other long term (current) drug therapy: Secondary | ICD-10-CM | POA: Diagnosis not present

## 2022-12-24 DIAGNOSIS — M170 Bilateral primary osteoarthritis of knee: Secondary | ICD-10-CM | POA: Insufficient documentation

## 2022-12-24 DIAGNOSIS — Z7729 Contact with and (suspected ) exposure to other hazardous substances: Secondary | ICD-10-CM | POA: Insufficient documentation

## 2022-12-24 DIAGNOSIS — R739 Hyperglycemia, unspecified: Secondary | ICD-10-CM | POA: Insufficient documentation

## 2022-12-24 DIAGNOSIS — M19021 Primary osteoarthritis, right elbow: Secondary | ICD-10-CM | POA: Diagnosis not present

## 2022-12-24 DIAGNOSIS — H527 Unspecified disorder of refraction: Secondary | ICD-10-CM | POA: Diagnosis not present

## 2022-12-24 DIAGNOSIS — D126 Benign neoplasm of colon, unspecified: Secondary | ICD-10-CM | POA: Insufficient documentation

## 2022-12-24 DIAGNOSIS — H35412 Lattice degeneration of retina, left eye: Secondary | ICD-10-CM | POA: Diagnosis not present

## 2022-12-24 DIAGNOSIS — H25043 Posterior subcapsular polar age-related cataract, bilateral: Secondary | ICD-10-CM | POA: Diagnosis not present

## 2022-12-24 LAB — CMP12+LP+TP+TSH+6AC+PSA+CBC…
ALT: 18 IU/L (ref 0–44)
AST: 22 IU/L (ref 0–40)
Albumin/Globulin Ratio: 2 (ref 1.2–2.2)
Albumin: 4.3 g/dL (ref 3.8–4.9)
Alkaline Phosphatase: 72 IU/L (ref 44–121)
BUN/Creatinine Ratio: 9 (ref 9–20)
BUN: 9 mg/dL (ref 6–24)
Basophils Absolute: 0 10*3/uL (ref 0.0–0.2)
Basos: 0 %
Bilirubin Total: 2.1 mg/dL — ABNORMAL HIGH (ref 0.0–1.2)
Calcium: 9.6 mg/dL (ref 8.7–10.2)
Chloride: 101 mmol/L (ref 96–106)
Chol/HDL Ratio: 4 ratio (ref 0.0–5.0)
Cholesterol, Total: 174 mg/dL (ref 100–199)
Creatinine, Ser: 0.95 mg/dL (ref 0.76–1.27)
EOS (ABSOLUTE): 0.1 10*3/uL (ref 0.0–0.4)
Eos: 2 %
Estimated CHD Risk: 0.8 times avg. (ref 0.0–1.0)
Free Thyroxine Index: 1 — ABNORMAL LOW (ref 1.2–4.9)
GGT: 20 IU/L (ref 0–65)
Globulin, Total: 2.1 g/dL (ref 1.5–4.5)
Glucose: 101 mg/dL — ABNORMAL HIGH (ref 70–99)
HDL: 43 mg/dL (ref >39)
Hematocrit: 45.2 % (ref 37.5–51.0)
Hemoglobin: 15.4 g/dL (ref 13.0–17.7)
Immature Grans (Abs): 0 10*3/uL (ref 0.0–0.1)
Immature Granulocytes: 0 %
Iron: 86 ug/dL (ref 38–169)
LDH: 194 IU/L (ref 121–224)
LDL Chol Calc (NIH): 107 mg/dL — ABNORMAL HIGH (ref 0–99)
Lymphocytes Absolute: 1.1 10*3/uL (ref 0.7–3.1)
Lymphs: 20 %
MCH: 27.8 pg (ref 26.6–33.0)
MCHC: 34.1 g/dL (ref 31.5–35.7)
MCV: 82 fL (ref 79–97)
Monocytes Absolute: 0.5 10*3/uL (ref 0.1–0.9)
Monocytes: 9 %
Neutrophils Absolute: 3.7 10*3/uL (ref 1.4–7.0)
Neutrophils: 69 %
Phosphorus: 3.2 mg/dL (ref 2.8–4.1)
Platelets: 226 10*3/uL (ref 150–450)
Potassium: 4.1 mmol/L (ref 3.5–5.2)
Prostate Specific Ag, Serum: 1 ng/mL (ref 0.0–4.0)
RBC: 5.54 x10E6/uL (ref 4.14–5.80)
RDW: 15.6 % — ABNORMAL HIGH (ref 11.6–15.4)
Sodium: 137 mmol/L (ref 134–144)
T3 Uptake Ratio: 21 % — ABNORMAL LOW (ref 24–39)
T4, Total: 4.6 ug/dL (ref 4.5–12.0)
TSH: 0.277 u[IU]/mL — ABNORMAL LOW (ref 0.450–4.500)
Total Protein: 6.4 g/dL (ref 6.0–8.5)
Triglycerides: 134 mg/dL (ref 0–149)
Uric Acid: 4.3 mg/dL (ref 3.8–8.4)
VLDL Cholesterol Cal: 24 mg/dL (ref 5–40)
WBC: 5.3 10*3/uL (ref 3.4–10.8)
eGFR: 94 mL/min/{1.73_m2} (ref >59)

## 2022-12-24 LAB — HGB A1C W/O EAG: Hgb A1c MFr Bld: 6.9 % — ABNORMAL HIGH (ref 4.8–5.6)

## 2022-12-25 ENCOUNTER — Encounter: Payer: No Typology Code available for payment source | Admitting: Physician Assistant

## 2022-12-25 DIAGNOSIS — Z0000 Encounter for general adult medical examination without abnormal findings: Secondary | ICD-10-CM

## 2022-12-30 ENCOUNTER — Ambulatory Visit: Payer: Self-pay | Admitting: Adult Health

## 2022-12-30 VITALS — BP 130/84 | HR 70 | Temp 97.5°F | Resp 16 | Ht 72.0 in | Wt 328.0 lb

## 2022-12-30 DIAGNOSIS — R7989 Other specified abnormal findings of blood chemistry: Secondary | ICD-10-CM

## 2022-12-30 DIAGNOSIS — I10 Essential (primary) hypertension: Secondary | ICD-10-CM

## 2022-12-30 DIAGNOSIS — M25521 Pain in right elbow: Secondary | ICD-10-CM

## 2022-12-30 DIAGNOSIS — Z0000 Encounter for general adult medical examination without abnormal findings: Secondary | ICD-10-CM

## 2022-12-30 DIAGNOSIS — R7309 Other abnormal glucose: Secondary | ICD-10-CM

## 2022-12-30 MED ORDER — PREDNISONE 5 MG PO TABS: ORAL_TABLET | ORAL | 0 refills | Status: DC

## 2022-12-30 NOTE — Progress Notes (Signed)
[facility] [address], [place] [address]   Office Visit Note  Patient Name: Jeremy West  [id number]  [id number]  Date of Service: 12/30/2022  Chief Complaint  Patient presents with   Annual Exam     HPI Pt is here for annual physical.  Patient is morbidly obese 57 year old male.  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  He is divorced and has 2 daughters ages 45 and 54 who live locally.  He has medical history markable for atrial fibrillation, rheumatoid arthritis, osteoarthritis, sleep apnea, hypertension.  [facility]  He is attempting to lose weight before this surgery.  He is currently walking 6 miles a day in the [address].  He has switched to sugar-free drinks and candy.  His A1c has improved to 6.9 from 8.64 months ago. He does complain today of some right elbow pain and swelling.  This is typical of his arthritis and he states that it takes some prednisone to make that go down.     Current Medication:  Outpatient Encounter Medications as of 12/30/2022  Medication Sig Note   acetaminophen (TYLENOL) 650 MG CR tablet Take 650 mg by mouth every 8 (eight) hours as needed for pain. 01/13/2022: Uses prn   hydroxychloroquine (PLAQUENIL) 200 MG tablet Take 400 mg by mouth daily.     mycophenolate (CELLCEPT) 500 MG tablet Take by mouth 2 (two) times daily. arthritis    diltiazem (TIAZAC) 360 MG 24 hr capsule Take 1 tablet by mouth daily. (Patient not taking: Reported on 12/30/2022)    predniSONE (DELTASONE) 5 MG tablet TAKE SIX TABLETS BY MOUTH ONCE EVERY DAY FOR 2 DAYS, CONTINUE TAKING ONE LESS TABLET EVERY 2 DAYS UNTIL TAKING 1 TABLET DAILY FOR 2 DAYS AS DIRECTED WITH FOOD. (Patient not taking: Reported on 12/30/2022) 01/13/2022: Uses prn - Rx from [place]   [DISCONTINUED] methylPREDNISolone (MEDROL DOSEPAK) 4 MG TBPK tablet Take Tapered dose as directed     [DISCONTINUED] oxyCODONE (OXY IR/ROXICODONE) 5 MG immediate release tablet     [DISCONTINUED] predniSONE (DELTASONE) 20 MG tablet TAKE THREE TABLETS BY MOUTH EVERY DAY FOR 5 DAYS, THEN TAKE TWO TABLETS EVERY DAY FOR 5 DAYS, THEN TAKE ONE AND ONE-HALF TABLETS EVERY DAY FOR 5 DAYS, THEN TAKE ONE TABLET EVERY DAY FOR 3 DAYS 01/13/2022: Uses prn - Rx from [place]   [DISCONTINUED] Tocilizumab (ACTEMRA) 162 MG/0.9ML SOSY Inject 162 mg into the skin once a week.    No facility-administered encounter medications on file as of 12/30/2022.      Medical History: Past Medical History:  Diagnosis Date   Dilated aortic root (HCC)    a. 04/2017 Echo: Mod dil Ao root @ 44 mm.   GERD (gastroesophageal reflux disease)    History of echocardiogram    a. 04/2017 Echo: EF 55-60%, Mod dil Ao root @ 44 mm.  mildly dil LA.   Hypertension    Morbid obesity (HCC)    OA (osteoarthritis)    PAF (paroxysmal atrial fibrillation) (HCC)    a. Seen in 01/2010; b. CHA2DS2VASc = 1.   Rheumatoid arthritis (HCC)    Sleep apnea    Tubular adenoma of colon 03/12/2015   Villous adenoma of colon 03/12/2015     Vital Signs: BP 130/84   Pulse 70   Temp (!) 97.5 F (36.4 C)   Resp 16  Ht 6' (1.829 m)   Wt (!) 328 lb (148.8 kg)   SpO2 97%   BMI 44.48 kg/m    Review of Systems  Constitutional:  Negative for chills, fatigue and fever.  Musculoskeletal:  Positive for joint swelling.    Physical Exam Vitals and nursing note reviewed.  Constitutional:      Appearance: Normal appearance.  HENT:     Head: Normocephalic.     Right Ear: Tympanic membrane, ear canal and external ear normal.     Left Ear: Tympanic membrane and ear canal normal.     Nose: Nose normal.     Mouth/Throat:     Mouth: Mucous membranes are moist.  Eyes:     Pupils: Pupils are equal, round, and reactive to light.  Cardiovascular:     Rate and Rhythm: Normal rate.  Pulmonary:     Effort: Pulmonary effort is normal.     Breath sounds: Normal  breath sounds.  Abdominal:     Palpations: Abdomen is soft. There is no mass.     Tenderness: There is no abdominal tenderness. There is no right CVA tenderness or left CVA tenderness.  Neurological:     General: No focal deficit present.     Mental Status: He is alert and oriented to person, place, and time.    Assessment/Plan: 1. Routine adult health maintenance Up to date on PHM. - EKG 12-Lead  2. Right elbow pain Take steroid taper as prescribed.  - predniSONE (DELTASONE) 5 MG tablet; TAKE SIX TABLETS BY MOUTH ONCE EVERY DAY FOR 2 DAYS, CONTINUE TAKING ONE LESS TABLET EVERY 2 DAYS UNTIL TAKING 1 TABLET DAILY FOR 2 DAYS AS DIRECTED WITH FOOD.  Dispense: 42 tablet; Refill: 0  3. Elevated hemoglobin A1c A1c is improving.  Continue dietary changes, and have rechecked in 90 days.  Continue to walk daily for exercise.   4. Hypertension, unspecified type Good control, continue with exercise and weight loss, as well as medications.   5. Low TSH level Gave patient copy of labs to show/message [facility] doctor.  Discussed Thyroid US, as well as medication.  He gets most of his treatment through [place] so he would like to discuss with them.       General Counseling: nickson dimare understanding of the findings of todays visit and agrees with plan of treatment. I have discussed any further diagnostic evaluation that may be needed or ordered today. We also reviewed his medications today. he has been encouraged to call the office with any questions or concerns that should arise related to todays visit.   Orders Placed This Encounter  Procedures   EKG 12-Lead    No orders of the defined types were placed in this encounter.   Time spent:25 Minutes    Johnna Acosta AGNP-C Nurse Practitioner

## 2022-12-30 NOTE — Progress Notes (Signed)
Here for yearly physical with [facility]es [facility]s.  Stated losing weight prior to planning knee replacement.

## 2023-01-13 DIAGNOSIS — Z7901 Long term (current) use of anticoagulants: Secondary | ICD-10-CM | POA: Diagnosis not present

## 2023-01-13 DIAGNOSIS — G4733 Obstructive sleep apnea (adult) (pediatric): Secondary | ICD-10-CM | POA: Diagnosis not present

## 2023-01-13 DIAGNOSIS — I480 Paroxysmal atrial fibrillation: Secondary | ICD-10-CM | POA: Diagnosis not present

## 2023-01-13 DIAGNOSIS — M069 Rheumatoid arthritis, unspecified: Secondary | ICD-10-CM | POA: Diagnosis not present

## 2023-01-13 DIAGNOSIS — M25521 Pain in right elbow: Secondary | ICD-10-CM | POA: Diagnosis not present

## 2023-01-13 DIAGNOSIS — E669 Obesity, unspecified: Secondary | ICD-10-CM | POA: Diagnosis not present

## 2023-02-13 DIAGNOSIS — M7711 Lateral epicondylitis, right elbow: Secondary | ICD-10-CM | POA: Diagnosis not present

## 2023-02-16 DIAGNOSIS — M25421 Effusion, right elbow: Secondary | ICD-10-CM | POA: Diagnosis not present

## 2023-02-16 DIAGNOSIS — M059 Rheumatoid arthritis with rheumatoid factor, unspecified: Secondary | ICD-10-CM | POA: Diagnosis not present

## 2023-02-16 DIAGNOSIS — M19021 Primary osteoarthritis, right elbow: Secondary | ICD-10-CM | POA: Diagnosis not present

## 2023-03-02 DIAGNOSIS — I4891 Unspecified atrial fibrillation: Secondary | ICD-10-CM | POA: Diagnosis not present

## 2023-03-02 DIAGNOSIS — M19021 Primary osteoarthritis, right elbow: Secondary | ICD-10-CM | POA: Diagnosis not present

## 2023-03-09 ENCOUNTER — Ambulatory Visit: Payer: Self-pay | Admitting: Physician Assistant

## 2023-03-09 ENCOUNTER — Encounter: Payer: Self-pay | Admitting: Physician Assistant

## 2023-03-09 VITALS — BP 131/90 | HR 88 | Temp 98.0°F | Resp 12 | Ht 72.0 in | Wt 321.0 lb

## 2023-03-09 DIAGNOSIS — M19021 Primary osteoarthritis, right elbow: Secondary | ICD-10-CM | POA: Diagnosis not present

## 2023-03-09 DIAGNOSIS — H524 Presbyopia: Secondary | ICD-10-CM | POA: Insufficient documentation

## 2023-03-09 DIAGNOSIS — M25521 Pain in right elbow: Secondary | ICD-10-CM | POA: Insufficient documentation

## 2023-03-09 DIAGNOSIS — Z79899 Other long term (current) drug therapy: Secondary | ICD-10-CM | POA: Insufficient documentation

## 2023-03-09 NOTE — Addendum Note (Signed)
Addended by: Gardner Candle on: 03/09/2023 03:28 PM   Modules accepted: Orders

## 2023-03-09 NOTE — Progress Notes (Signed)
Subjective: Nutritionist referral    Patient ID: Jeremy West, male    DOB: 02-05-1966, 57 y.o.   MRN: [id number]  HPI Patient is requesting nutritionist referral for weight loss.  Patient states he needs to be under 300 pounds for knee surgery.  Patient weighed 343 pounds in January 2024.  Patient weighs 320 pounds January 20, 2023.  Today patient weighs 321 pounds.  Patient is using her diet consists of protein shake in the morning, fast food for lunch, and a sandwich after walking 6 miles in the evening.  This has been his regimen for the past 5 months.   Review of Systems A-fib, fatty liver, hypertension, and GERD.  t    Objective:   Physical Exam BP 131/90  BP Location Left Arm  Patient Position Sitting  Cuff Size Large  Pulse 88  Resp 12  Temp 98 F (36.7 C)  Temp src Temporal  SpO2 97 %  Weight 321 lb (145.6 kg)  Height 6' (1.829 m)   BMI 43.54 kg/m2  BSA 2.72 m2         Assessment & Plan: Weight loss    Consult to nutritionist for weight loss.

## 2023-03-09 NOTE — Progress Notes (Signed)
States he's been walking 6 miles a day for about 2-3 months & has gained weight.    In June weight was 328 lbs at 12/30/22 physical Initial consult with Dr. Audelia Acton on 07/2022 & weight was 343 lbs January 20, 2023 visit with Dr Audelia Acton weight was 320 lbs  States he hasn't made any diet changes except he says that he's not eating much.  Needs to get down to 300 lbs before he can have complete knee replacement on right knee.  Goal set by surgeon - [facility]  (Dr. Reinaldo Berber).  Requesting referral for Dietician/Nutrion Has never seen one  before.  AMD

## 2023-03-24 DIAGNOSIS — Z79620 Long term (current) use of immunosuppressive biologic: Secondary | ICD-10-CM | POA: Diagnosis not present

## 2023-04-01 DIAGNOSIS — T8484XA Pain due to internal orthopedic prosthetic devices, implants and grafts, initial encounter: Secondary | ICD-10-CM | POA: Diagnosis not present

## 2023-04-01 DIAGNOSIS — M1711 Unilateral primary osteoarthritis, right knee: Secondary | ICD-10-CM | POA: Diagnosis not present

## 2023-04-06 ENCOUNTER — Other Ambulatory Visit: Payer: Self-pay

## 2023-04-06 DIAGNOSIS — M25521 Pain in right elbow: Secondary | ICD-10-CM

## 2023-04-06 MED ORDER — PREDNISONE 5 MG PO TABS: ORAL_TABLET | ORAL | 0 refills | Status: DC

## 2023-04-07 ENCOUNTER — Other Ambulatory Visit: Payer: Self-pay | Admitting: Orthopedic Surgery

## 2023-04-14 ENCOUNTER — Encounter: Payer: 59 | Attending: Physician Assistant | Admitting: Dietician

## 2023-04-14 ENCOUNTER — Encounter: Payer: Self-pay | Admitting: Dietician

## 2023-04-14 VITALS — Ht 72.0 in | Wt 323.4 lb

## 2023-04-14 DIAGNOSIS — I10 Essential (primary) hypertension: Secondary | ICD-10-CM | POA: Diagnosis not present

## 2023-04-14 DIAGNOSIS — K7581 Nonalcoholic steatohepatitis (NASH): Secondary | ICD-10-CM | POA: Insufficient documentation

## 2023-04-14 DIAGNOSIS — Z6841 Body Mass Index (BMI) 40.0 and over, adult: Secondary | ICD-10-CM | POA: Insufficient documentation

## 2023-04-14 DIAGNOSIS — G4733 Obstructive sleep apnea (adult) (pediatric): Secondary | ICD-10-CM | POA: Diagnosis not present

## 2023-04-14 DIAGNOSIS — R7301 Impaired fasting glucose: Secondary | ICD-10-CM | POA: Diagnosis not present

## 2023-04-14 DIAGNOSIS — K219 Gastro-esophageal reflux disease without esophagitis: Secondary | ICD-10-CM | POA: Insufficient documentation

## 2023-04-14 DIAGNOSIS — E669 Obesity, unspecified: Secondary | ICD-10-CM

## 2023-04-14 NOTE — Patient Instructions (Addendum)
Continue towards your weight loss goal of under 300 lbs. at a rate of ~2 lbs. per week. Be sure to weigh yourself once a week only. Keep up the great work with your walks!!!  Your goal for daily water is 64 oz.  Have your fiber supplement drink in the evening after your walk.  Look for PB2 to replace your peanut butter to have with your banana in the evening, and add a scoop to your morning smoothie.   Begin to build your meals using the proportions of the Balanced Plate. First, select your carb choice(s) for the meal. Make this 25% of your meal. Next, select your source of protein to pair with your carb choice(s). Make this another 25% of your meal. Choose lean proteins as often as possible (lean ground beef, fish without breading, pork chops without the fat). Finally, complete your meal with a variety of non-starchy vegetables. Make this the remaining 50% of your meal. Try "Steam in Bag" frozen vegetables.  Search online for nutrition facts for whatever restaurant you are eating at. Type in the name of the restaurant and then "nutrition facts"  You can also use [employer].com.cy or [employer]et[employer].fi for nutrition information

## 2023-04-14 NOTE — Progress Notes (Signed)
Medical Nutrition Therapy  Appointment Start time:  [phone]  Appointment End time:  0945  Primary concerns today: Weight loss for knee replacement  Referral diagnosis: E66.01 - Obesity Preferred learning style: No preference indicated Learning readiness: Change in progress   NUTRITION ASSESSMENT   Anthropometrics: Ht: 72" Wt: 323.4 lbs BMI: 48.86 kg/m2 Goal Wt: <300 lbs to be eligible for R knee replacement Wt history: -23 lbs. x6 months  Clinical Medical Hx: IFG, NASH, HTN, OSA, GERD Medications: Prednisone (PRN) Labs: A1c - 6.9%, LDL - 107 Notable Signs/Symptoms: None identified  Lifestyle & Dietary Hx Pt reports needing to lose weight to under 300 lbs in order to be eligible for R knee replacement. States knee was previously very painful, has become much more bearable since losing weight. Pt reports previously walking 3 miles a day, recently bumped up to 6 miles per day. Pt reports they will getting a rod removed from their leg October 3rd, may not be able to bear weight for 2-4 weeks, possibly longer. Pt reports hitting a plateau of weight loss recently and feels like their diet is standing in the way of getting to their goal. Pt states their daughter is health oriented and is trying to assist them in dietary choices as well. Pt reports history of eating a lot of chocolate, drinking Pepsi, recently switched to [employer] and Atkins chocolate/PB cups. Pt reports being mobile in the area for work, usually has fast food for lunch. Pt reports living alone and finds it difficult to cook healthy meals for one. Pt reports waking up around 4 a.m., reads their bible, has breakfast between 5:00 - 6:00 am, lunch at 11:00 am, dinner by 5:00 pm, walks 6:00-8:00 pm, goes to bed between 9:30 - 10:30 pm    Estimated daily fluid intake: ~64 oz, has a 64 oz. Bottle they keep with them Supplements: N/A Sleep: OSA, no longer on CPAP Stress / self-care: Moderate Current average weekly physical  activity: Walks ~6 miles daily   24-Hr Dietary Recall First Meal: Banana smoothie Snack: none Second Meal: [place] sandwich, cheese, mayonnaise, handful of cheese puffs, coke zero, atkins PB cup Snack: none Third Meal: 3 pieces of breaded fish, 1/2 can pork and beans Snack: PB, banana Beverages: Coke Zero, Water   NUTRITION DIAGNOSIS  NB-1.1 Food and nutrition-related knowledge deficit As related to Obesity.  As evidenced by BMI of 43.86 kg/m2, diet history high in fat/fast food..   NUTRITION INTERVENTION  Nutrition education (E-1) on the following topics:  Educated patient on the two components of energy balance: Energy in (calories), and energy out (activity). Explain the role of negative energy balance in weight loss. Discussed options with patient to achieve a negative energy balance and how to best control energy in and energy out to accommodate their lifestyle.  Handouts Provided Include  Balanced   Learning Style & Readiness for Change Teaching method utilized: Visual & Auditory  Demonstrated degree of understanding via: Teach Back  Barriers to learning/adherence to lifestyle change: None  Goals Established by Pt Continue towards your weight loss goal of under 300 lbs. at a rate of ~2 lbs. per week. Be sure to weigh yourself once a week only. Keep up the great work with your walks!!! Your goal for daily water is 64 oz. Have your fiber supplement drink in the evening after your walk. Look for PB2 to replace your peanut butter to have with your banana in the evening, and add a scoop to your morning smoothie.  Begin to build your meals using the proportions of the Balanced Plate. First, select your carb choice(s) for the meal. Make this 25% of your meal. Next, select your source of protein to pair with your carb choice(s). Make this another 25% of your meal. Choose lean proteins as often as possible (lean ground beef, fish without breading, pork chops without the fat). Finally,  complete your meal with a variety of non-starchy vegetables. Make this the remaining 50% of your meal. Try "Steam in Bag" frozen vegetables. Search online for nutrition facts for whatever restaurant you are eating at. Type in the name of the restaurant and then "nutrition facts" You can also use [employer]com[employer]cy or [employer].fi for nutrition information   MONITORING & EVALUATION Dietary intake, weekly physical activity, and weight loss PRN  Next Steps  Patient is to call and schedule follow up once recovered from procedure.

## 2023-04-16 ENCOUNTER — Encounter
Admission: RE | Admit: 2023-04-16 | Discharge: 2023-04-16 | Disposition: A | Discharge status: Home / Self Care | Payer: 59 | Source: Ambulatory Visit | Attending: Orthopedic Surgery | Admitting: Orthopedic Surgery

## 2023-04-16 HISTORY — DX: Abnormal levels of other serum enzymes: R74.8

## 2023-04-16 HISTORY — DX: Type 2 diabetes mellitus without complications: E11.9

## 2023-04-16 HISTORY — DX: Depression, unspecified: F32.A

## 2023-04-16 HISTORY — DX: Hyperglycemia, unspecified: R73.9

## 2023-04-16 HISTORY — DX: Rheumatoid arthritis with rheumatoid factor, unspecified: M05.9

## 2023-04-16 HISTORY — DX: Nonalcoholic steatohepatitis (NASH): K75.81

## 2023-04-16 HISTORY — DX: Obstructive sleep apnea (adult) (pediatric): G47.33

## 2023-04-16 HISTORY — DX: Acute cholecystitis: K81.0

## 2023-04-16 HISTORY — DX: Personal history of urinary calculi: Z87.442

## 2023-04-16 NOTE — Patient Instructions (Addendum)
Your procedure is scheduled on:04-23-23 Thursday Report to the Registration Desk on the 1st floor of the [facility].Then proceed to the 2nd floor Surgery Desk To find out your arrival time, please call [phone] between 1PM - 3PM on:04-22-23 Wednesday If your arrival time is 6:00 am, do not arrive before that time as the [facility] entrance doors do not open until 6:00 am.  REMEMBER: Instructions that are not followed completely may result in serious medical risk, up to and including death; or upon the discretion of your surgeon and anesthesiologist your surgery may need to be rescheduled.  Do not eat food after midnight the night before surgery.  No gum chewing or hard candies.  You may however, drink CLEAR liquids up to 2 hours before you are scheduled to arrive for your surgery. Do not drink anything within 2 hours of your scheduled arrival time.  Clear liquids include: - water  - apple juice without pulp - gatorade (not RED colors) - black coffee or tea (Do NOT add milk or creamers to the coffee or tea) Do NOT drink anything that is not on this list.  In addition, your doctor has ordered for you to drink the provided:  Ensure Pre-Surgery Clear Carbohydrate Drink  Drinking this carbohydrate drink up to two hours before surgery helps to reduce insulin resistance and improve patient outcomes. Please complete drinking 2 hours before scheduled arrival time.  One week prior to surgery: Stop Anti-inflammatories (NSAIDS) such as Advil, Aleve, Ibuprofen, Motrin, Naproxen, Naprosyn and Aspirin based products such as Excedrin, Goody's Powder, BC Powder.You may however, continue to take Tylenol if needed for pain up until the day of surgery. Stop ANY OVER THE COUNTER supplements/vitamins NOW (04-16-23) until after surgery. You may continue your Fiber packet  Continue taking all prescribed medications  Do NOT take any medication the day of surgery  No Alcohol for 24 hours before or after  surgery.  No Smoking including e-cigarettes for 24 hours before surgery.  No chewable tobacco products for at least 6 hours before surgery.  No nicotine patches on the day of surgery.  Do not use any "recreational" drugs for at least a week (preferably 2 weeks) before your surgery.  Please be advised that the combination of cocaine and anesthesia may have negative outcomes, up to and including death. If you test positive for cocaine, your surgery will be cancelled.  On the morning of surgery brush your teeth with toothpaste and water, you may rinse your mouth with mouthwash if you wish. Do not swallow any toothpaste or mouthwash.  Use CHG Soap as directed on instruction sheet.  Do not wear jewelry, make-up, hairpins, clips or nail polish.  For welded (permanent) jewelry: bracelets, anklets, waist bands, etc.  Please have this removed prior to surgery.  If it is not removed, there is a chance that hospital personnel will need to cut it off on the day of surgery.  Do not wear lotions, powders, or perfumes.   Do not shave body hair from the neck down 48 hours before surgery.  Contact lenses, hearing aids and dentures may not be worn into surgery.  Do not bring valuables to the hospital. [facility] is not responsible for any missing/lost belongings or valuables.   Bring your C-PAP to the hospital  Notify your doctor if there is any change in your medical condition (cold, fever, infection).  Wear comfortable clothing (specific to your surgery type) to the hospital.  After surgery, you can help  prevent lung complications by doing breathing exercises.  Take deep breaths and cough every 1-2 hours. Your doctor may order a device called an Incentive Spirometer to help you take deep breaths. When coughing or sneezing, hold a pillow firmly against your incision with both hands. This is called "splinting." Doing this helps protect your incision. It also decreases belly discomfort.  If you  are being admitted to the hospital overnight, leave your suitcase in the car. After surgery it may be brought to your room.  In case of increased patient census, it may be necessary for you, the patient, to continue your postoperative care in the Same Day Surgery department.  If you are being discharged the day of surgery, you will not be allowed to drive home. You will need a responsible individual to drive you home and stay with you for 24 hours after surgery.   If you are taking public transportation, you will need to have a responsible individual with you.  Please call the Pre-admissions Testing Dept. at [phone] if you have any questions about these instructions.  Surgery Visitation Policy:  Patients having surgery or a procedure may have two visitors.  Children under the age of 43 must have an adult with them who is not the patient.  Inpatient Visitation:    Visiting hours are 7 a.m. to 8 p.m. Up to four visitors are allowed at one time in a patient room. The visitors may rotate out with other people during the day.  One visitor age 59 or older may stay with the patient overnight and must be in the room by 8 p.m.     Preparing for Surgery with CHLORHEXIDINE GLUCONATE (CHG) Soap  Chlorhexidine Gluconate (CHG) Soap  o An antiseptic cleaner that kills germs and bonds with the skin to continue killing germs even after washing  o Used for showering the night before surgery and morning of surgery  Before surgery, you can play an important role by reducing the number of germs on your skin.  CHG (Chlorhexidine gluconate) soap is an antiseptic cleanser which kills germs and bonds with the skin to continue killing germs even after washing.  Please do not use if you have an allergy to CHG or antibacterial soaps. If your skin becomes reddened/irritated stop using the CHG.  1. Shower the NIGHT BEFORE SURGERY and the MORNING OF SURGERY with CHG soap.  2. If you choose to wash  your hair, wash your hair first as usual with your normal shampoo.  3. After shampooing, rinse your hair and body thoroughly to remove the shampoo.  4. Use CHG as you would any other liquid soap. You can apply CHG directly to the skin and wash gently with a scrungie or a clean washcloth.  5. Apply the CHG soap to your body only from the neck down. Do not use on open wounds or open sores. Avoid contact with your eyes, ears, mouth, and genitals (private parts). Wash face and genitals (private parts) with your normal soap.  6. Wash thoroughly, paying special attention to the area where your surgery will be performed.  7. Thoroughly rinse your body with warm water.  8. Do not shower/wash with your normal soap after using and rinsing off the CHG soap.  9. Pat yourself dry with a clean towel.  10. Wear clean pajamas to bed the night before surgery.  12. Place clean sheets on your bed the night of your first shower and do not sleep with pets.  13. Shower again with the CHG soap on the day of surgery prior to arriving at the hospital.  14. Do not apply any deodorants/lotions/powders.  15. Please wear clean clothes to the hospital.  How to Use an Incentive Spirometer An incentive spirometer is a tool that measures how well you are filling your lungs with each breath. Learning to take long, deep breaths using this tool can help you keep your lungs clear and active. This may help to reverse or lessen your chance of developing breathing (pulmonary) problems, especially infection. You may be asked to use a spirometer: After a surgery. If you have a lung problem or a history of smoking. After a long period of time when you have been unable to move or be active. If the spirometer includes an indicator to show the highest number that you have reached, your health care provider or respiratory therapist will help you set a goal. Keep a log of your progress as told by your health care provider. What are  the risks? Breathing too quickly may cause dizziness or cause you to pass out. Take your time so you do not get dizzy or light-headed. If you are in pain, you may need to take pain medicine before doing incentive spirometry. It is harder to take a deep breath if you are having pain. How to use your incentive spirometer  Sit up on the edge of your bed or on a chair. Hold the incentive spirometer so that it is in an upright position. Before you use the spirometer, breathe out normally. Place the mouthpiece in your mouth. Make sure your lips are closed tightly around it. Breathe in slowly and as deeply as you can through your mouth, causing the piston or the ball to rise toward the top of the chamber. Hold your breath for 3-5 seconds, or for as long as possible. If the spirometer includes a coach indicator, use this to guide you in breathing. Slow down your breathing if the indicator goes above the marked areas. Remove the mouthpiece from your mouth and breathe out normally. The piston or ball will return to the bottom of the chamber. Rest for a few seconds, then repeat the steps 10 or more times. Take your time and take a few normal breaths between deep breaths so that you do not get dizzy or light-headed. Do this every 1-2 hours when you are awake. If the spirometer includes a goal marker to show the highest number you have reached (best effort), use this as a goal to work toward during each repetition. After each set of 10 deep breaths, cough a few times. This will help to make sure that your lungs are clear. If you have an incision on your chest or abdomen from surgery, place a pillow or a rolled-up towel firmly against the incision when you cough. This can help to reduce pain while taking deep breaths and coughing. General tips When you are able to get out of bed: Walk around often. Continue to take deep breaths and cough in order to clear your lungs. Keep using the incentive spirometer  until your health care provider says it is okay to stop using it. If you have been in the hospital, you may be told to keep using the spirometer at home. Contact a health care provider if: You are having difficulty using the spirometer. You have trouble using the spirometer as often as instructed. Your pain medicine is not giving enough relief for you to use the  spirometer as told. You have a fever. Get help right away if: You develop shortness of breath. You develop a cough with bloody mucus from the lungs. You have fluid or blood coming from an incision site after you cough. Summary An incentive spirometer is a tool that can help you learn to take long, deep breaths to keep your lungs clear and active. You may be asked to use a spirometer after a surgery, if you have a lung problem or a history of smoking, or if you have been inactive for a long period of time. Use your incentive spirometer as instructed every 1-2 hours while you are awake. If you have an incision on your chest or abdomen, place a pillow or a rolled-up towel firmly against your incision when you cough. This will help to reduce pain. Get help right away if you have shortness of breath, you cough up bloody mucus, or blood comes from your incision when you cough. This information is not intended to replace advice given to you by your health care provider. Make sure you discuss any questions you have with your health care provider. Document Revised: 09/26/2019 Document Reviewed: 09/26/2019 Elsevier Patient Education  2024 [employer].

## 2023-04-23 ENCOUNTER — Ambulatory Visit: Payer: No Typology Code available for payment source

## 2023-04-23 ENCOUNTER — Ambulatory Visit
Admission: RE | Admit: 2023-04-23 | Discharge: 2023-04-23 | Disposition: A | Discharge status: Home / Self Care | Payer: No Typology Code available for payment source | Attending: Orthopedic Surgery | Admitting: Orthopedic Surgery

## 2023-04-23 ENCOUNTER — Other Ambulatory Visit: Payer: Self-pay

## 2023-04-23 ENCOUNTER — Encounter
Admission: RE | Disposition: A | Discharge status: Home / Self Care | Payer: Self-pay | Source: Home / Self Care | Attending: Orthopedic Surgery

## 2023-04-23 ENCOUNTER — Ambulatory Visit: Payer: No Typology Code available for payment source | Admitting: Urgent Care

## 2023-04-23 ENCOUNTER — Encounter: Payer: Self-pay | Admitting: Orthopedic Surgery

## 2023-04-23 DIAGNOSIS — Y831 Surgical operation with implant of artificial internal device as the cause of abnormal reaction of the patient, or of later complication, without mention of misadventure at the time of the procedure: Secondary | ICD-10-CM | POA: Diagnosis not present

## 2023-04-23 DIAGNOSIS — T8484XA Pain due to internal orthopedic prosthetic devices, implants and grafts, initial encounter: Secondary | ICD-10-CM | POA: Insufficient documentation

## 2023-04-23 DIAGNOSIS — R262 Difficulty in walking, not elsewhere classified: Secondary | ICD-10-CM | POA: Diagnosis not present

## 2023-04-23 DIAGNOSIS — Z472 Encounter for removal of internal fixation device: Secondary | ICD-10-CM | POA: Diagnosis not present

## 2023-04-23 DIAGNOSIS — G4730 Sleep apnea, unspecified: Secondary | ICD-10-CM | POA: Insufficient documentation

## 2023-04-23 DIAGNOSIS — S82201A Unspecified fracture of shaft of right tibia, initial encounter for closed fracture: Secondary | ICD-10-CM | POA: Diagnosis not present

## 2023-04-23 DIAGNOSIS — I10 Essential (primary) hypertension: Secondary | ICD-10-CM | POA: Insufficient documentation

## 2023-04-23 DIAGNOSIS — M1711 Unilateral primary osteoarthritis, right knee: Secondary | ICD-10-CM | POA: Diagnosis not present

## 2023-04-23 DIAGNOSIS — Z87891 Personal history of nicotine dependence: Secondary | ICD-10-CM | POA: Diagnosis not present

## 2023-04-23 DIAGNOSIS — M25561 Pain in right knee: Secondary | ICD-10-CM

## 2023-04-23 HISTORY — PX: HARDWARE REMOVAL: SHX979

## 2023-04-23 LAB — GLUCOSE, CAPILLARY: Glucose-Capillary: 136 mg/dL — ABNORMAL HIGH (ref 70–99)

## 2023-04-23 SURGERY — REMOVAL, HARDWARE
Anesthesia: Regional | Site: Leg Lower | Laterality: Right

## 2023-04-23 MED ORDER — MIDAZOLAM HCL 2 MG/2ML IJ SOLN
2.0000 mg | Freq: Once | INTRAMUSCULAR | Status: AC
  Administered 2023-04-23: 2 mg via INTRAVENOUS

## 2023-04-23 MED ORDER — ONDANSETRON HCL 4 MG/2ML IJ SOLN: 4.0000 mg | Freq: Four times a day (QID) | INTRAMUSCULAR | Status: DC | PRN

## 2023-04-23 MED ORDER — DOCUSATE SODIUM 100 MG PO CAPS: 100.0000 mg | ORAL_CAPSULE | Freq: Every day | ORAL | 0 refills | Status: DC | PRN

## 2023-04-23 MED ORDER — BUPIVACAINE HCL (PF) 0.5 % IJ SOLN
INTRAMUSCULAR | Status: DC | PRN
Start: 2023-04-23 — End: 2023-04-23
  Administered 2023-04-23: 20 mL

## 2023-04-23 MED ORDER — BUPIVACAINE-EPINEPHRINE (PF) 0.25% -1:200000 IJ SOLN
INTRAMUSCULAR | Status: DC | PRN
Start: 2023-04-23 — End: 2023-04-23
  Administered 2023-04-23: 20 mL

## 2023-04-23 MED ORDER — LACTATED RINGERS IV SOLN: INTRAVENOUS | Status: DC

## 2023-04-23 MED ORDER — ONDANSETRON HCL 4 MG/2ML IJ SOLN
INTRAMUSCULAR | Status: DC | PRN
  Administered 2023-04-23: 4 mg via INTRAVENOUS

## 2023-04-23 MED ORDER — MIDAZOLAM HCL 2 MG/2ML IJ SOLN
INTRAMUSCULAR | Status: AC
  Filled 2023-04-23: qty 2

## 2023-04-23 MED ORDER — OXYCODONE HCL 5 MG PO TABS
5.0000 mg | ORAL_TABLET | Freq: Once | ORAL | Status: AC | PRN
  Administered 2023-04-23: 5 mg via ORAL

## 2023-04-23 MED ORDER — DEXAMETHASONE SODIUM PHOSPHATE 10 MG/ML IJ SOLN
INTRAMUSCULAR | Status: DC | PRN
  Administered 2023-04-23: 10 mg via INTRAVENOUS

## 2023-04-23 MED ORDER — ORAL CARE MOUTH RINSE: 15.0000 mL | Freq: Once | OROMUCOSAL | Status: AC

## 2023-04-23 MED ORDER — ASPIRIN 81 MG PO TBEC: 81.0000 mg | DELAYED_RELEASE_TABLET | Freq: Two times a day (BID) | ORAL | 0 refills | Status: AC

## 2023-04-23 MED ORDER — ACETAMINOPHEN 10 MG/ML IV SOLN
INTRAVENOUS | Status: DC | PRN
  Administered 2023-04-23: 1000 mg via INTRAVENOUS

## 2023-04-23 MED ORDER — MORPHINE SULFATE (PF) 2 MG/ML IV SOLN: 0.5000 mg | INTRAVENOUS | Status: DC | PRN

## 2023-04-23 MED ORDER — CHLORHEXIDINE GLUCONATE 0.12 % MT SOLN
OROMUCOSAL | Status: AC
  Filled 2023-04-23: qty 15

## 2023-04-23 MED ORDER — ONDANSETRON HCL 4 MG/2ML IJ SOLN
INTRAMUSCULAR | Status: AC
  Filled 2023-04-23: qty 2

## 2023-04-23 MED ORDER — ACETAMINOPHEN 10 MG/ML IV SOLN
INTRAVENOUS | Status: AC
  Filled 2023-04-23: qty 100

## 2023-04-23 MED ORDER — DEXAMETHASONE SODIUM PHOSPHATE 10 MG/ML IJ SOLN
INTRAMUSCULAR | Status: AC
  Filled 2023-04-23: qty 1

## 2023-04-23 MED ORDER — OXYCODONE HCL 5 MG/5ML PO SOLN: 5.0000 mg | Freq: Once | ORAL | Status: AC | PRN

## 2023-04-23 MED ORDER — CEFAZOLIN SODIUM 1 G IJ SOLR
INTRAMUSCULAR | Status: AC
  Filled 2023-04-23: qty 30

## 2023-04-23 MED ORDER — FAMOTIDINE 20 MG PO TABS
20.0000 mg | ORAL_TABLET | Freq: Once | ORAL | Status: AC
  Administered 2023-04-23: 20 mg via ORAL

## 2023-04-23 MED ORDER — LIDOCAINE HCL (PF) 2 % IJ SOLN
INTRAMUSCULAR | Status: AC
  Filled 2023-04-23: qty 5

## 2023-04-23 MED ORDER — CEFAZOLIN SODIUM-DEXTROSE 2-3 GM-%(50ML) IV SOLR
INTRAVENOUS | Status: DC | PRN
Start: 2023-04-23 — End: 2023-04-23
  Administered 2023-04-23: 2 g via INTRAVENOUS
  Administered 2023-04-23: 1 g via INTRAVENOUS

## 2023-04-23 MED ORDER — ONDANSETRON HCL 4 MG PO TABS: 4.0000 mg | ORAL_TABLET | Freq: Three times a day (TID) | ORAL | 0 refills | Status: DC | PRN

## 2023-04-23 MED ORDER — TRANEXAMIC ACID-NACL 1000-0.7 MG/100ML-% IV SOLN
INTRAVENOUS | Status: AC
  Filled 2023-04-23: qty 100

## 2023-04-23 MED ORDER — PROPOFOL 10 MG/ML IV BOLUS
INTRAVENOUS | Status: AC
  Filled 2023-04-23: qty 20

## 2023-04-23 MED ORDER — BUPIVACAINE-EPINEPHRINE (PF) 0.25% -1:200000 IJ SOLN
INTRAMUSCULAR | Status: AC
  Filled 2023-04-23: qty 30

## 2023-04-23 MED ORDER — ONDANSETRON HCL 4 MG/2ML IJ SOLN: 4.0000 mg | Freq: Once | INTRAMUSCULAR | Status: DC | PRN

## 2023-04-23 MED ORDER — 0.9 % SODIUM CHLORIDE (POUR BTL) OPTIME
TOPICAL | Status: DC | PRN
  Administered 2023-04-23: 1000 mL

## 2023-04-23 MED ORDER — CHLORHEXIDINE GLUCONATE 0.12 % MT SOLN
15.0000 mL | Freq: Once | OROMUCOSAL | Status: AC
  Administered 2023-04-23: 15 mL via OROMUCOSAL

## 2023-04-23 MED ORDER — MIDAZOLAM HCL 2 MG/2ML IJ SOLN
INTRAMUSCULAR | Status: DC | PRN
  Administered 2023-04-23: 2 mg via INTRAVENOUS

## 2023-04-23 MED ORDER — DEXMEDETOMIDINE HCL IN NACL 80 MCG/20ML IV SOLN
INTRAVENOUS | Status: DC | PRN
Start: 2023-04-23 — End: 2023-04-23
  Administered 2023-04-23: 4 ug via INTRAVENOUS
  Administered 2023-04-23 (×2): 8 ug via INTRAVENOUS

## 2023-04-23 MED ORDER — BUPIVACAINE HCL (PF) 0.5 % IJ SOLN
INTRAMUSCULAR | Status: AC
  Filled 2023-04-23: qty 20

## 2023-04-23 MED ORDER — FENTANYL CITRATE (PF) 100 MCG/2ML IJ SOLN
INTRAMUSCULAR | Status: DC | PRN
  Administered 2023-04-23 (×2): 50 ug via INTRAVENOUS

## 2023-04-23 MED ORDER — LACTATED RINGERS IV SOLN: INTRAVENOUS | Status: DC | PRN

## 2023-04-23 MED ORDER — HYDROCODONE-ACETAMINOPHEN 5-325 MG PO TABS: 1.0000 | ORAL_TABLET | ORAL | Status: DC | PRN

## 2023-04-23 MED ORDER — TRANEXAMIC ACID-NACL 1000-0.7 MG/100ML-% IV SOLN
INTRAVENOUS | Status: DC | PRN
  Administered 2023-04-23: 1000 mg via INTRAVENOUS

## 2023-04-23 MED ORDER — FENTANYL CITRATE (PF) 100 MCG/2ML IJ SOLN
25.0000 ug | INTRAMUSCULAR | Status: DC | PRN
  Administered 2023-04-23 (×2): 50 ug via INTRAVENOUS

## 2023-04-23 MED ORDER — HYDROCODONE-ACETAMINOPHEN 7.5-325 MG PO TABS: 1.0000 | ORAL_TABLET | ORAL | Status: DC | PRN

## 2023-04-23 MED ORDER — FENTANYL CITRATE PF 50 MCG/ML IJ SOSY
PREFILLED_SYRINGE | INTRAMUSCULAR | Status: AC
  Filled 2023-04-23: qty 1

## 2023-04-23 MED ORDER — ONDANSETRON HCL 4 MG PO TABS: 4.0000 mg | ORAL_TABLET | Freq: Four times a day (QID) | ORAL | Status: DC | PRN

## 2023-04-23 MED ORDER — OXYCODONE HCL 5 MG PO TABS
5.0000 mg | ORAL_TABLET | ORAL | 0 refills | Status: DC | PRN
Start: 2023-04-23 — End: 2023-10-08

## 2023-04-23 MED ORDER — OXYCODONE HCL 5 MG PO TABS
ORAL_TABLET | ORAL | Status: AC
  Filled 2023-04-23: qty 1

## 2023-04-23 MED ORDER — DEXMEDETOMIDINE HCL IN NACL 80 MCG/20ML IV SOLN
INTRAVENOUS | Status: AC
  Filled 2023-04-23: qty 20

## 2023-04-23 MED ORDER — ACETAMINOPHEN 325 MG PO TABS: 325.0000 mg | ORAL_TABLET | Freq: Four times a day (QID) | ORAL | Status: DC | PRN

## 2023-04-23 MED ORDER — FENTANYL CITRATE PF 50 MCG/ML IJ SOSY
50.0000 ug | PREFILLED_SYRINGE | Freq: Once | INTRAMUSCULAR | Status: AC
  Administered 2023-04-23: 50 ug via INTRAVENOUS

## 2023-04-23 MED ORDER — SEVOFLURANE IN SOLN
RESPIRATORY_TRACT | Status: AC
  Filled 2023-04-23: qty 250

## 2023-04-23 MED ORDER — ACETAMINOPHEN 10 MG/ML IV SOLN: 1000.0000 mg | Freq: Once | INTRAVENOUS | Status: DC | PRN

## 2023-04-23 MED ORDER — FENTANYL CITRATE (PF) 100 MCG/2ML IJ SOLN
INTRAMUSCULAR | Status: AC
  Filled 2023-04-23: qty 2

## 2023-04-23 MED ORDER — PROPOFOL 10 MG/ML IV BOLUS
INTRAVENOUS | Status: DC | PRN
  Administered 2023-04-23: 200 mg via INTRAVENOUS

## 2023-04-23 MED ORDER — FAMOTIDINE 20 MG PO TABS
ORAL_TABLET | ORAL | Status: AC
  Filled 2023-04-23: qty 1

## 2023-04-23 SURGICAL SUPPLY — 62 items
4-0 stratafix IMPLANT
ADH SKN CLS APL DERMABOND .7 (GAUZE/BANDAGES/DRESSINGS) ×2
APL PRP STRL LF DISP 70% ISPRP (MISCELLANEOUS) ×2
CHLORAPREP W/TINT 26 (MISCELLANEOUS) ×1 IMPLANT
COOLER POLAR GLACIER W/PUMP (MISCELLANEOUS) IMPLANT
CUFF TOURN SGL QUICK 24 (TOURNIQUET CUFF)
CUFF TOURN SGL QUICK 30 (TOURNIQUET CUFF)
CUFF TOURN SGL QUICK 34 (TOURNIQUET CUFF) ×1
CUFF TRNQT CYL 24X4X16.5-23 (TOURNIQUET CUFF) IMPLANT
CUFF TRNQT CYL 30X4X21-28X (TOURNIQUET CUFF) IMPLANT
CUFF TRNQT CYL 34X4X40X1 (TOURNIQUET CUFF) IMPLANT
DERMABOND ADVANCED .7 DNX12 (GAUZE/BANDAGES/DRESSINGS) IMPLANT
DRAPE C-ARM 42X70 (DRAPES) IMPLANT
DRAPE C-ARMOR (DRAPES) IMPLANT
DRSG OPSITE POSTOP 3X4 (GAUZE/BANDAGES/DRESSINGS) IMPLANT
DRSG OPSITE POSTOP 4X8 (GAUZE/BANDAGES/DRESSINGS) IMPLANT
ELECT REM PT RETURN 9FT ADLT (ELECTROSURGICAL) ×1
ELECTRODE REM PT RTRN 9FT ADLT (ELECTROSURGICAL) ×1 IMPLANT
GAUZE SPONGE 4X4 12PLY STRL (GAUZE/BANDAGES/DRESSINGS) ×1 IMPLANT
GAUZE XEROFORM 1X8 LF (GAUZE/BANDAGES/DRESSINGS) ×2 IMPLANT
GLOVE BIO SURGEON STRL SZ8 (GLOVE) ×2 IMPLANT
GLOVE BIOGEL PI IND STRL 8 (GLOVE) ×1 IMPLANT
GLOVE PI ORTHO PRO STRL 7.5 (GLOVE) ×1 IMPLANT
GLOVE PI ORTHO PRO STRL SZ8 (GLOVE) ×2 IMPLANT
GOWN SRG XL LVL 3 NONREINFORCE (GOWNS) ×1 IMPLANT
GOWN STRL NON-REIN TWL XL LVL3 (GOWNS) ×1
GOWN STRL REUS W/ TWL LRG LVL3 (GOWN DISPOSABLE) ×1 IMPLANT
GOWN STRL REUS W/TWL LRG LVL3 (GOWN DISPOSABLE) ×1
HANDLE YANKAUER SUCT OPEN TIP (MISCELLANEOUS) IMPLANT
HOOD PEEL AWAY T7 (MISCELLANEOUS) IMPLANT
KIT TURNOVER KIT A (KITS) ×1 IMPLANT
MANIFOLD NEPTUNE II (INSTRUMENTS) ×1 IMPLANT
MAT ABSORB FLUID 56X50 GRAY (MISCELLANEOUS) IMPLANT
NDL HYPO 22X1.5 SAFETY MO (MISCELLANEOUS) IMPLANT
NDL MAYO CATGUT SZ 2 (NEEDLE) ×1 IMPLANT
NEEDLE HYPO 22X1.5 SAFETY MO (MISCELLANEOUS) ×1 IMPLANT
NEEDLE MAYO CATGUT SZ 1.5 (NEEDLE)
NEEDLE MAYO CATGUT SZ 2 (NEEDLE) IMPLANT
NS IRRIG 1000ML POUR BTL (IV SOLUTION) ×1 IMPLANT
PACK TOTAL KNEE (MISCELLANEOUS) ×1 IMPLANT
PAD ABD DERMACEA PRESS 5X9 (GAUZE/BANDAGES/DRESSINGS) ×2 IMPLANT
PAD WRAPON POLOR MULTI XL (MISCELLANEOUS) IMPLANT
PASSER SUT SWANSON 36MM LOOP (INSTRUMENTS) IMPLANT
RETRIEVER SUT HEWSON (MISCELLANEOUS) IMPLANT
SCALPEL PROTECTED #10 DISP (BLADE) ×2 IMPLANT
STAPLER SKIN PROX 35W (STAPLE) ×1 IMPLANT
SUCTION TUBE FRAZIER 12FR DISP (SUCTIONS) IMPLANT
SUT DVC 2 QUILL PDO T11 36X36 (SUTURE) IMPLANT
SUT ETHIBOND #5 BRAIDED 30INL (SUTURE) ×1 IMPLANT
SUT QUILL MONODERM 3-0 PS-2 (SUTURE) ×1 IMPLANT
SUT SILK 0 (SUTURE) ×1
SUT SILK 0 30XBRD TIE 6 (SUTURE) IMPLANT
SUT STRATA 1 CT-1 DLB (SUTURE) ×1
SUT VIC AB 0 CT1 36 (SUTURE) ×1 IMPLANT
SUT VIC AB 1 CT1 36 (SUTURE) ×1 IMPLANT
SUT VIC AB 2-0 CT2 27 (SUTURE) ×1 IMPLANT
SUTURE STRATA SPIR 4-0 18 (SUTURE) IMPLANT
SYR 20ML LL LF (SYRINGE) IMPLANT
TRAP FLUID SMOKE EVACUATOR (MISCELLANEOUS) ×1 IMPLANT
WATER STERILE IRR 1000ML POUR (IV SOLUTION) IMPLANT
WATER STERILE IRR 500ML POUR (IV SOLUTION) ×1 IMPLANT
WRAP-ON POLOR PAD MULTI XL (MISCELLANEOUS) ×1

## 2023-04-23 NOTE — Anesthesia Procedure Notes (Signed)
Procedure Name: LMA Insertion Date/Time: 04/23/2023 1:45 PM  Performed by: Lily Lovings, CRNAPre-anesthesia Checklist: Patient identified, Patient being monitored, Timeout performed, Emergency Drugs available and Suction available Patient Re-evaluated:Patient Re-evaluated prior to induction Oxygen Delivery Method: Circle system utilized Preoxygenation: Pre-oxygenation with 100% oxygen Induction Type: IV induction Ventilation: Mask ventilation without difficulty LMA: LMA inserted LMA Size: 5.0 Tube type: Oral Number of attempts: 1 Placement Confirmation: positive ETCO2 and breath sounds checked- equal and bilateral Tube secured with: Tape Dental Injury: Teeth and Oropharynx as per pre-operative assessment
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
## 2023-04-23 NOTE — Transfer of Care (Signed)
Immediate Anesthesia Transfer of Care Note  Patient: Jeremy West  Procedure(s) Performed: Procedure(s): Right knee hardware removal, tibial nail, knee examination under direct visualization - RNFA (Right)  Patient Location: PACU  Anesthesia Type:General  Level of Consciousness: sedated  Airway & Oxygen Therapy: Patient Spontanous Breathing and Patient connected to face mask oxygen  Post-op Assessment: Report given to RN and Post -op Vital signs reviewed and stable  Post vital signs: Reviewed and stable  Last Vitals:  Vitals:   04/23/23 1322 04/23/23 1601  BP:  118/74  Pulse: 88 77  Resp:  (!) 23  Temp:  37.6 C  SpO2: 99% 98%    Complications: No apparent anesthesia complications

## 2023-04-23 NOTE — Anesthesia Procedure Notes (Signed)
Anesthesia Regional Block: Adductor canal block   Pre-Anesthetic Checklist: , timeout performed,  Correct Patient, Correct Site, Correct Laterality,  Correct Procedure,, risks and benefits discussed,  Surgical consent,  Pre-op evaluation,  At surgeon's request and post-op pain management  Laterality: Right  Prep: chloraprep       Needles:  Injection technique: Single-shot  Needle Type: Echogenic Needle          Additional Needles:   Procedures:,,,, ultrasound used (permanent image in chart),,   Motor weakness within 20 minutes.  Narrative:  Start time: 04/23/2023 1:19 PM End time: 04/23/2023 1:21 PM Injection made incrementally with aspirations every 5 mL.  Performed by: Personally  Anesthesiologist: Reed Breech, MD  Additional Notes: Functioning IV was confirmed and monitors applied.  Sterile prep and drape, hand hygiene and sterile gloves were used. Ultrasound guidance: relevant anatomy identified, needle position confirmed, local anesthetic spread visualized around nerve(s), vascular puncture avoided.  Image saved to electronic medical record.  Negative aspiration prior to incremental administration of local anesthetic for total 20 ml bupivacaine 0.5% given in adductor saphenous distribution. The patient tolerated the procedure well. Vital signs and moderate sedation medications recorded in RN notes.

## 2023-04-23 NOTE — Interval H&P Note (Signed)
Patient history and physical updated. Consent reviewed including risks, benefits, and alternatives to surgery. Patient agrees with above plan to proceed with right tibia nail and hardware removal. Discussed that we may only be able to remove partial hardware depending on how well fixed the nail is. We would plan in this situation to remove enough of the hardware for the future total knee arthroplasty.

## 2023-04-23 NOTE — Discharge Instructions (Addendum)
AMBULATORY SURGERY  DISCHARGE INSTRUCTIONS   The drugs that you were given will stay in your system until tomorrow so for the next 24 hours you should not:  Drive an automobile Make any legal decisions Drink any alcoholic beverage   You may resume regular meals tomorrow.  Today it is better to start with liquids and gradually work up to solid foods.  You may eat anything you prefer, but it is better to start with liquids, then soup and crackers, and gradually work up to solid foods.   Please notify your doctor immediately if you have any unusual bleeding, trouble breathing, redness and pain at the surgery site, drainage, fever, or pain not relieved by medication.    Additional Instructions: You should only be partial weightbearing on the right leg at 50% and use crutches for walking.  Swelling is normal after surgery and you can expect some swelling especially of the lower leg due to the surgical site at the ankle.  Make sure to elevate the leg and ice both the ankle and the knee utilizing the Polar Care unit and regular ice on the ankle.  Follow-up will be in the office in 2 weeks at your already scheduled appointment.  If you have any questions or concerns please call [facility].          Please contact your physician with any problems or Same Day Surgery at [phone], Monday through Friday 6 am to 4 pm, or [place] at [facility] number at [phone].            POLAR CARE INFORMATION  MassAdvertisement.it  How to use [employer] Polar Care [facility] Therapy System?  YouTube   ShippingScam.co.uk  OPERATING INSTRUCTIONS  Start the product With dry hands, connect the transformer to the electrical connection located on the top of the cooler. Next, plug the transformer into an appropriate electrical outlet. The unit will automatically start running at this point.  To stop the pump, disconnect electrical power.  Unplug to stop the  product when not in use. Unplugging the Polar Care unit turns it off. Always unplug immediately after use. Never leave it plugged in while unattended. Remove pad.    FIRST ADD WATER TO FILL LINE, THEN ICE---Replace ice when existing ice is almost melted  1 Discuss Treatment with your Licensed Health Care Practitioner and Use Only as Prescribed 2 Apply Insulation Barrier & Cold Therapy Pad 3 Check for Moisture 4 Inspect Skin Regularly  Tips and Trouble Shooting Usage Tips 1. Use cubed or chunked ice for optimal performance. 2. It is recommended to drain the Pad between uses. To drain the pad, hold the Pad upright with the hose pointed toward the ground. Depress the black plunger and allow water to drain out. 3. You may disconnect the Pad from the unit without removing the pad from the affected area by depressing the silver tabs on the hose coupling and gently pulling the hoses apart. The Pad and unit will seal itself and will not leak. Note: Some dripping during release is normal. 4. DO NOT RUN PUMP WITHOUT WATER! The pump in this unit is designed to run with water. Running the unit without water will cause permanent damage to the pump. 5. Unplug unit before removing lid.  TROUBLESHOOTING GUIDE Pump not running, Water not flowing to the pad, Pad is not getting cold 1. Make sure the transformer is plugged into the wall outlet. 2. Confirm that the ice and water are filled  to the indicated levels. 3. Make sure there are no kinks in the pad. 4. Gently pull on the blue tube to make sure the tube/pad junction is straight. 5. Remove the pad from the treatment site and ll it while the pad is lying at; then reapply. 6. Confirm that the pad couplings are securely attached to the unit. Listen for the double clicks (Figure 1) to confirm the pad couplings are securely attached.  Leaks    Note: Some condensation on the lines, controller, and pads is unavoidable, especially in warmer climates. 1. If using  a [employer] Polar Care Cold Therapy unit with a detachable Cold Therapy Pad, and a leak exists (other than condensation on the lines) disconnect the pad couplings. Make sure the silver tabs on the couplings are depressed before reconnecting the pad to the pump hose; then confirm both sides of the coupling are properly clicked in. 2. If the coupling continues to leak or a leak is detected in the pad itself, stop using it and call [employer] Customer Care at [phone].  Cleaning After use, empty and dry the unit with a soft cloth. Warm water and mild detergent may be used occasionally to clean the pump and tubes.  WARNING: The Polar Care Cube can be cold enough to cause serious injury, including full skin necrosis. Follow these Operating Instructions, and carefully read the Product Insert (see pouch on side of unit) and the Cold Therapy Pad Fitting Instructions (provided with each Cold Therapy Pad) prior to use.

## 2023-04-23 NOTE — Anesthesia Postprocedure Evaluation (Signed)
Anesthesia Post Note  Patient: Jeremy West  Procedure(s) Performed: Right knee hardware removal, tibial nail, knee examination under direct visualization - RNFA (Right: Leg Lower)  Patient location during evaluation: PACU Anesthesia Type: Regional Level of consciousness: awake and alert Pain management: pain level controlled Vital Signs Assessment: post-procedure vital signs reviewed and stable Respiratory status: spontaneous breathing, nonlabored ventilation, respiratory function stable and patient connected to nasal cannula oxygen Cardiovascular status: blood pressure returned to baseline and stable Postop Assessment: no apparent nausea or vomiting Anesthetic complications: no  No notable events documented.   Last Vitals:  Vitals:   04/23/23 1630 04/23/23 1718  BP: 126/82 (!) 129/91  Pulse: 83 79  Resp: 15 16  Temp: 36.8 C 36.5 C  SpO2: 93% 98%    Last Pain:  Vitals:   04/23/23 1718  TempSrc: Temporal  PainSc: 4                  Stephanie Coup

## 2023-04-23 NOTE — Anesthesia Preprocedure Evaluation (Addendum)
Anesthesia Evaluation  Patient identified by MRN, date of birth, ID band Patient awake    Reviewed: Allergy & Precautions, NPO status , Patient's Chart, lab work & pertinent test results  History of Anesthesia Complications Negative for: history of anesthetic complications  Airway Mallampati: III   Neck ROM: Full    Dental  (+) Missing   Pulmonary sleep apnea and Continuous Positive Airway Pressure Ventilation , former smoker (quit 1995)   Pulmonary exam normal breath sounds clear to auscultation       Cardiovascular Exercise Tolerance: Good hypertension, Normal cardiovascular exam+ dysrhythmias (a fib, transient several years ago, never anticoagulated)  Rhythm:Regular Rate:Normal  ECG 12/30/22: SR, unchanged from prior in 2022   Neuro/Psych negative neurological ROS     GI/Hepatic ,GERD  ,,NASH   Endo/Other  diabetes, Type 2  Class 3 obesity  Renal/GU Renal disease (nephrolithiasis)     Musculoskeletal  (+) Arthritis , Osteoarthritis and Rheumatoid disorders,    Abdominal   Peds  Hematology negative hematology ROS (+)   Anesthesia Other Findings   Reproductive/Obstetrics                             Anesthesia Physical Anesthesia Plan  ASA: 3  Anesthesia Plan: General and Regional   Post-op Pain Management: Regional block*   Induction: Intravenous  PONV Risk Score and Plan: 2 and Ondansetron, Dexamethasone and Treatment may vary due to age or medical condition  Airway Management Planned: LMA  Additional Equipment:   Intra-op Plan:   Post-operative Plan: Extubation in OR  Informed Consent: I have reviewed the patients History and Physical, chart, labs and discussed the procedure including the risks, benefits and alternatives for the proposed anesthesia with the patient or authorized representative who has indicated his/her understanding and acceptance.     Dental advisory  given  Plan Discussed with: CRNA  Anesthesia Plan Comments: (Plan for preoperative adductor saphenous nerve block and GA with LMA, GETA backup. Patient consented for risks of anesthesia including but not limited to:  - adverse reactions to medications - damage to eyes, teeth, lips or other oral mucosa - nerve damage due to positioning  - sore throat or hoarseness - damage to heart, brain, nerves, lungs, other parts of body or loss of life  Informed patient about role of CRNA in peri- and intra-operative care.  Patient voiced understanding.)        Anesthesia Quick Evaluation

## 2023-04-23 NOTE — Evaluation (Signed)
Physical Therapy Evaluation Patient Details Name: Jeremy West MRN: [id number] DOB: 1966-03-12 Today's Date: 04/23/2023  History of Present Illness  57 yo male s/p removal of hardware of R tiba 04/23/23.  Clinical Impression  Pt pleasant and engaged t/o session, showed good effort and understanding of importance of PWBing restrictions.  He was able to maintain safe and appropriate cadence with walker (deferred crutches use 2/2 signficant R elbow extension weakness).  Pt also able to negotiate up/down 4 steps with walker/retro strategy - daughter present, educated on, and assisted with home entry simulation.  Pt will benefit from continued PT to address functional limitations.        If plan is discharge home, recommend the following: A little help with walking and/or transfers;A little help with bathing/dressing/bathroom;Assistance with cooking/housework;Assist for transportation;Help with stairs or ramp for entrance   Can travel by private vehicle        Equipment Recommendations  (shower chair/bench)  Recommendations for Other Services       Functional Status Assessment Patient has had a recent decline in their functional status and demonstrates the ability to make significant improvements in function in a reasonable and predictable amount of time.     Precautions / Restrictions Precautions Precautions: Fall Restrictions Weight Bearing Restrictions: Yes RLE Weight Bearing: Partial weight bearing RLE Partial Weight Bearing Percentage or Pounds: 50%      Mobility  Bed Mobility Overal bed mobility: Modified Independent                  Transfers Overall transfer level: Modified independent Equipment used: Rolling walker (2 wheels)               General transfer comment: expected reliance on UEs and forward momentum but able to rise from standard height recliner multiple times w/o assist    Ambulation/Gait Ambulation/Gait assistance: Supervision Gait  Distance (Feet): 75 Feet Assistive device: Rolling walker (2 wheels)         General Gait Details: Pt showed  able to maintain a slow but consistent cadence, good awareness of 50% PWB (heavy cuing/reinforcement)  Stairs Stairs: Yes Stairs assistance: Supervision Stair Management: No rails, Backwards Number of Stairs: 4 General stair comments: Pt was able to ascend and descend steps with retro strategy. Daughter present, educated on and trialed with assist with walker stabilization, he was able to negotiate w/o phyiscal assist and again showed good awareness and maintainance of PWBing  Wheelchair Mobility     Tilt Bed    Modified Rankin (Stroke Patients Only)       Balance Overall balance assessment: Modified Independent                                           Pertinent Vitals/Pain Pain Assessment Pain Assessment: 0-10 Pain Score: 4  Pain Location: R knee    Home Living Family/patient expects to be discharged to:: Private residence Living Arrangements: Alone Available Help at Discharge: Family;Available PRN/intermittently (daughter present and lives ~1 hr away, other family and friend intermittently available)   Home Access: Stairs to enter Entrance Stairs-Rails: Right (but behind storm door when open) Entrance Stairs-Number of Steps: 2+1   Home Layout: One level Home Equipment: Agricultural consultant (2 wheels);Crutches (apparently toilet riser had been ordered)      Prior Function Prior Level of Function : Independent/Modified Independent  Mobility Comments: increasing knee pain recently, but able to do all he needs w/o issue or AD, walks ~51miles/day ADLs Comments: independent     Extremity/Trunk Assessment   Upper Extremity Assessment Upper Extremity Assessment: RUE deficits/detail RUE Deficits / Details: pt with limited R elbow movement and very poor extension strength "I need [facility] elbow [facility], but [facility]"    Lower Extremity Assessment Lower Extremity Assessment: Overall WFL for tasks assessed (expected R knee weakness post surgery but functional t/o.  knee ROM grossly 0-80)       Communication   Communication Communication: No apparent difficulties  Cognition Arousal: Alert Behavior During Therapy: WFL for tasks assessed/performed Overall Cognitive Status: Within Functional Limits for tasks assessed                                          General Comments      Exercises     Assessment/Plan    PT Assessment Patient needs continued PT services  PT Problem List Decreased strength;Decreased range of motion;Decreased activity tolerance;Decreased balance;Decreased safety awareness;Decreased knowledge of use of DME;Decreased knowledge of precautions;Pain       PT Treatment Interventions DME instruction;Gait training;Stair training;Functional mobility training;Therapeutic activities;Therapeutic exercise;Balance training;Patient/family education    PT Goals (Current goals can be found in the Care Plan section)  Acute Rehab PT Goals Patient Stated Goal: go home PT Goal Formulation: With patient Time For Goal Achievement: 05/06/23 Potential to Achieve Goals: Good    Frequency 7X/week     Co-evaluation               AM-PAC PT "6 Clicks" Mobility  Outcome Measure Help needed turning from your back to your side while in a flat bed without using bedrails?: None Help needed moving from lying on your back to sitting on the side of a flat bed without using bedrails?: None Help needed moving to and from a bed to a chair (including a wheelchair)?: None Help needed standing up from a chair using your arms (e.g., wheelchair or bedside chair)?: None Help needed to walk in hospital room?: None Help needed climbing 3-5 steps with a railing? : A Little 6 Click Score: 23    End of Session Equipment Utilized During Treatment: Gait belt Activity Tolerance:  Patient tolerated treatment well Patient left: in chair;with call bell/phone within reach;with family/visitor present;with nursing/sitter in room Nurse Communication: Mobility status;Weight bearing status PT Visit Diagnosis: Muscle weakness (generalized) (M62.81);Difficulty in walking, not elsewhere classified (R26.2);Pain Pain - Right/Left: Right Pain - part of body: Knee    Time: 4403-4742 PT Time Calculation (min) (ACUTE ONLY): 42 min   Charges:   PT Evaluation $PT Eval Low Complexity: 1 Low PT Treatments $Gait Training: 8-22 mins $Therapeutic Activity: 8-22 mins PT General Charges $$ ACUTE PT VISIT: 1 Visit         Malachi Pro, DPT 04/23/2023, 6:25 PM

## 2023-04-23 NOTE — H&P (Signed)
History of Present Illness: The patient is an 57 y.o. male seen in clinic today for follow-up evaluation of his right knee. Patient has known posttraumatic arthritis along with rheumatoid arthritis and a retained tibial nail in the right knee and tibia. He has significant ongoing pain which is limiting his activity on daily basis with pain up to a 7 or 8 out of 10 affecting his function and use of his knee. He has undergone gel injections and new rheumatoid medications with minimal improvement in pain and function. He has limitations with walking up and down hills or going up and down stairs. He has used ice, compression, elevation, Tylenol, diclofenac, rheumatoid medications. The patient denies fevers, chills, numbness, tingling, shortness of breath, chest pain, recent illness, or any trauma.  Patient is continue to work on weight loss and is down to a BMI of 43 he is a non-smoker and controlled blood sugars with a recent A1c of 6.9  Past Medical History: Past Medical History:  Diagnosis Date  Arthritis  Hypertension   Past Surgical History: Past Surgical History:  Procedure Laterality Date  COLONOSCOPY 03/12/2015  Adenomatous Polyps: CBF 02/2017; Recall Ltr mailed 01/13/2017 (dw); Spoke w/pt 01/29/2017 stating he is still paying for last procedure & cannot afford to have repeat colon (dw)  EGD 03/12/2015  No repeat per RTE  CHOLECYSTECTOMY 11/09/2019  E. Cintron  ARTHROSCOPIC ROTATOR CUFF REPAIR Right 02/09/2020  Procedure: RIGHT ARTHROSCOPY, SHOULDER, ROTATOR CUFF REPAIR; Surgeon: Virgina Organ, MD; Location: [facility] OR; Service: Orthopedics; Laterality: Right; medium  ARTHROSCOPIC ROTATOR CUFF REPAIR Left 06/19/2022  Procedure: Left ARTHROSCOPY, SHOULDER WITH ROTATOR CUFF REPAIR; Surgeon: Virgina Organ, MD; Location: [facility] OR; Service: Orthopedics; Laterality: Left;  ARTHROTOMY FOOT Right PINS  COMPLEX REPAIR LEG Right ROD  HERNIA REPAIR   Past Family History: Family History   Problem Relation Age of Onset  Cancer Mother  Cancer Father  Anesthesia problems Neg Hx   Medications: Current Outpatient Medications  Medication Sig Dispense Refill  abatacept 125 mg/mL AtIn Inject subcutaneously once a week Tuesday  acetaminophen (TYLENOL) 650 MG ER tablet Take by mouth as needed  diclofenac (VOLTAREN) 75 MG EC tablet Take 1 tablet (75 mg total) by mouth 2 (two) times daily with meals 6 tablet 0  diltiazem (TIAZAC) 180 MG ER capsule Take 1 capsule by mouth once daily Morning  hydrOXYchloroQUINE (PLAQUENIL) 200 mg tablet Take 2 tablets by mouth every morning  mycophenolate (CELLCEPT) 500 mg tablet MYCOPHENOLATE MOFETIL 500MG  TAB Active TAKE ONE TABLET BY MOUTH TWICE A DAY IST Nov 17, 2022 60 Nov 18, 2023 [id number] Jan 08, 2023 [facility]  predniSONE (DELTASONE) 10 MG tablet TAKE SIX TABLETS BY MOUTH DAILY FOR 5 DAYS, THEN TAKE FOUR TABLETS DAILY FOR 5 DAYS, THEN TAKE TWO TABLETS DAILY FOR 5 DAYS, THEN TAKE ONE TABLET DAILY FOR 5 DAYS - TAKE WITH FOOD  tocilizumab (ACTEMRA) 162 mg/0.9 mL subcutaneous inj syringe   No current facility-administered medications for this visit.   Allergies: Allergies  Allergen Reactions  Penicillins Anaphylaxis  Childhood reaction Has patient had a PCN reaction causing immediate rash, facial/tongue/throat swelling, SOB or lightheadedness with hypotension: Unknown Has patient had a PCN reaction causing severe rash involving mucus membranes or skin necrosis: Unknown Has patient had a PCN reaction that required hospitalization: Unknown Has patient had a PCN reaction occurring within the last 10 years: No If all of the above answers are "NO", then may proceed with Cephalosporin use.  Humira Pen [Adalimumab]  Swelling    Visit Vitals: Vitals:  04/01/23 1046  BP: (!) 130/90    Review of Systems:  A comprehensive 14 point ROS was performed, reviewed, and the pertinent orthopaedic findings are  documented in the HPI.  Physical Exam: General/Constitutional: No apparent distress: well-nourished and well developed. Eyes: Pupils equal, round with synchronous movement. Pulmonary exam: Lungs clear to auscultation bilaterally no wheezing rales or rhonchi Cardiac exam: Regular rate and rhythm no obvious murmurs rubs or gallops. Integumentary: No impressive skin lesions present, except as noted in detailed exam. Neuro/Psych: Normal mood and affect, oriented to person, place and time.  Comprehensive Knee Exam: Gait Non-antalgic and fluid  Alignment Neutral   Inspection Right Left  Skin Normal appearance with no obvious deformity. Healed surgical midline incision slightly medially. Additional healed incisions and wound over the medial ankle well-appearing. No ecchymosis or erythema. Normal appearance with no obvious deformity. No ecchymosis or erythema.  Soft Tissue No focal soft tissue swelling No focal soft tissue swelling  Quad Atrophy None None   Palpation  Right Left  Tenderness Medial and lateral joint line tenderness minimal anterior patellar tendon tenderness to deep palpation. also tender to palpation over the head of the screw over his medial ankle. No peripatellar, patellar tendon, quad tendon, medial/lateral joint line pain  Crepitus + Patellofemoral crepitus No patellofemoral or tibiofemoral crepitus  Effusion None None   Range of Motion Right Left  Flexion 0-100 0-120  Extension Full knee extension without hyperextension Full knee extension without hyperextension   Ligamentous Exam Right Left  Lachman Normal Normal  Valgus 0 Normal Normal  Valgus 30 Normal Normal  Varus 0 Normal Normal  Varus 30 Normal Normal  Anterior Drawer Normal Normal  Posterior Drawer Normal Normal   Meniscal Exam Right Left  Hyperflexion Test Positive Negative  Hyperextension Test Negative Negative  McMurray's Positive Negative   Neurovascular Right Left  Quadriceps Strength 5/5  5/5  Hamstring Strength 5/5 5/5  Hip Abductor Strength 4/5 4/5  Distal Motor Normal Normal  Distal Sensory Normal light touch sensation Normal light touch sensation  Distal Pulses Normal Normal     Imaging Studies: I reviewed AP and lateral tibia and fibula x-rays ordered and taken today in clinic images reviewed by myself. Patient is status post intramedullary nailing of the tibia to just above the level of the ankle with 1 screw proximal and 2 screws distally with evidence of good interval healing of a distal third tibial shaft and fibular shaft fracture with abundant new bone formation no evidence of any periprosthetic fracture or loosening or persistent fracture at the fracture site.  I have reviewed AP, lateral,sunrise, and flexed PA weight bearing knee X-rays (4 views) of the right knee taken at the previous office visit show the patient is status post intramedullary nail right tibia. The nail is prominent anterior superiorly of the tibial plateau into the central joint space but does not appear to be impinging or getting into the femur. There are also degenerative changes in the right knee and both medial and lateral compartments with joint space narrowing irregular bone surface with sclerosis and osteophyte formation. There is severe patellofemoral joint space narrowing with bone-on-bone articulation and osteophyte formation along with sclerosis and subchondral cysts. Kellgren-Lawrence grade 3/4 in the right knee. Left knee shows some mild degenerative changes with medial compartment joint space narrowing with some osteophyte formation and sclerosis. There is also some more significant patellofemoral disease on the left side as well. Kellgren-Lawrence grade 2/3  in the left. no fractures or dislocations noted in either knee.   Cell count from aspiration performed on 07/15/2022 of the right knee shows white blood cells [id number] with 37% neutrophils Culture from aspiration on 07/15/2022 of the right  knee shows no growth final  Assessment:  Right knee osteoarthritis, right tibia retained painful orthopedic hardware  Plan: Loxley is a 57 year old male who has right knee osteoarthritis with severe degeneration which will likely need a knee replacement. As he has a retained tibial nail we discussed staged surgical intervention with a planned removal of his tibial nail and then up return to the OR 3 months later for a total knee replacement. He has tenderness and pain over the screw heads consistent with a painful retained orthopedic hardware and the nail itself is in position which would prohibit placement of the intramedullary component of the tibial component of a total knee. Based upon the patient's continued symptoms and failure to respond to conservative treatment, I have recommended a right total knee replacement with a for staged procedure for nail removal with a later total knee replacement 3 months postoperatively patient. A long discussion took place with the patient describing what nail removal would entail, and what total joint replacement is and what the procedure would entail. A knee model, similar to the implants that will be used during the operation, was utilized to demonstrate the implants. We discussed the surgical approach for removing the tibial nail including the risk of cracking or breaking the bone while removing the nail.  The hospitalization and post-operative care and rehabilitation were also discussed. The use of perioperative antibiotics and DVT prophylaxis were discussed. The risk, benefits and alternatives to a surgical intervention were discussed at length with the patient. The patient was also advised of risks related to the medical comorbidities and elevated body mass index (BMI). A lengthy discussion took place to review the most common complications including but not limited to: stiffness, loss of function, complex regional pain syndrome, deep vein thrombosis, pulmonary  embolus, heart attack, stroke, infection, wound breakdown, numbness, intraoperative fracture, damage to nerves, tendon,muscles, arteries or other blood vessels, death and other possible complications from anesthesia. The patient was told that we will take steps to minimize these risks by using sterile technique, antibiotics and DVT prophylaxis when appropriate and follow the patient postoperatively in the office setting to monitor progress. The possibility of recurrent pain, no improvement in pain and actual worsening of pain were also discussed with the patient. We also discussed the risk of postoperative fracture after the nail was removed due to weakening of the bone from the screw holes. We may elect to limit his weightbearing postoperatively for a few weeks to a month while he begins to heal.  The discharge plan of care focused on the patient going home following surgery. The patient was encouraged to make the necessary arrangements to have someone stay with them when they are discharged home.   The benefits of surgery were discussed with the patient including the potential for improving the patient's current clinical condition through operative intervention. Alternatives to surgical intervention including continued conservative management were also discussed in detail. All questions were answered to the satisfaction of the patient. The patient participated and agreed to the plan of care as well as the use of the recommended implants for their total knee replacement surgery. An information packet was given to the patient to review prior to surgery.    The patient received medical clearance for surgery. All  questions answered and patient agrees with the above plan make preparations for a right knee tibial nail removal as a staged procedure for future total knee replacement.  Portions of this record have been created using Scientist, clinical (histocompatibility and immunogenetics). Dictation errors have been sought, but may not have  been identified and corrected.  Reinaldo Berber MD

## 2023-04-24 ENCOUNTER — Encounter: Payer: Self-pay | Admitting: Orthopedic Surgery

## 2023-04-27 DIAGNOSIS — M79661 Pain in right lower leg: Secondary | ICD-10-CM | POA: Diagnosis not present

## 2023-05-18 ENCOUNTER — Other Ambulatory Visit: Payer: Self-pay | Admitting: Physician Assistant

## 2023-05-18 MED ORDER — METHYLPREDNISOLONE 4 MG PO TBPK: ORAL_TABLET | ORAL | 0 refills | Status: DC

## 2023-05-22 DIAGNOSIS — Z79620 Long term (current) use of immunosuppressive biologic: Secondary | ICD-10-CM | POA: Diagnosis not present

## 2023-05-22 DIAGNOSIS — M7711 Lateral epicondylitis, right elbow: Secondary | ICD-10-CM | POA: Diagnosis not present

## 2023-05-22 DIAGNOSIS — R030 Elevated blood-pressure reading, without diagnosis of hypertension: Secondary | ICD-10-CM | POA: Diagnosis not present

## 2023-05-22 DIAGNOSIS — M25421 Effusion, right elbow: Secondary | ICD-10-CM | POA: Diagnosis not present

## 2023-05-22 DIAGNOSIS — M059 Rheumatoid arthritis with rheumatoid factor, unspecified: Secondary | ICD-10-CM | POA: Diagnosis not present

## 2023-06-04 DIAGNOSIS — M79661 Pain in right lower leg: Secondary | ICD-10-CM | POA: Diagnosis not present

## 2023-06-05 DIAGNOSIS — T8484XA Pain due to internal orthopedic prosthetic devices, implants and grafts, initial encounter: Secondary | ICD-10-CM | POA: Diagnosis not present

## 2023-06-08 ENCOUNTER — Ambulatory Visit: Payer: Self-pay | Admitting: Physician Assistant

## 2023-06-08 ENCOUNTER — Encounter: Payer: Self-pay | Admitting: Physician Assistant

## 2023-06-08 DIAGNOSIS — Z7689 Persons encountering health services in other specified circumstances: Secondary | ICD-10-CM

## 2023-06-08 NOTE — Progress Notes (Signed)
   Subjective: Returned to work evaluation    Patient ID: Jeremy West, male    DOB: 05-Sep-1965, 57 y.o.   MRN: [id number]  HPI Patient has been released from physical therapy on 06/05/2023 status post hardware removal from right lower extremity.  Is recommended the patient goes on light duty until 06/21/2023.  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   Review of Systems A-fib, hypertension, and osteoarthritis bilateral knees.    Objective:   Physical Exam Vital signs not taken.  Examination of right knee reveals no edema or erythema or ecchymosis.  Surgical scar consistent with history.  Patient has full and equal range of motion with flexion extension against resistance.  Neurovascular intact.  Patient still ambulates with atypical gait.       Assessment & Plan: Return to work evaluation   Based on note from PT/orthopedics patient will will start a trial of light duty until 06/21/2023.  Patient will follow-up after 06/21/2003 if return to full duty is uncomfortable.

## 2023-06-08 NOTE — Progress Notes (Signed)
Pt presents today for RTW note. Pt has note w/him from doctor.

## 2023-06-11 ENCOUNTER — Encounter: Payer: 59 | Attending: Physician Assistant | Admitting: Dietician

## 2023-06-11 ENCOUNTER — Encounter: Payer: Self-pay | Admitting: Dietician

## 2023-06-11 VITALS — Ht 72.0 in | Wt 319.3 lb

## 2023-06-11 DIAGNOSIS — E669 Obesity, unspecified: Secondary | ICD-10-CM | POA: Diagnosis not present

## 2023-06-11 NOTE — Patient Instructions (Addendum)
Add some intensity to your walks by increasing the pace, try to feel your heart rate rate increase and breathing getting a little faster/deeper. This will increase the benefit of your exercise!  Add 2 Tbsp of PB2 to your smoothie for extra protein, and unsweetened milk.  Consider adding either Metamucil, Benefiber, Psyllium husk daily to increase fiber. Try the gummies, or add it to your morning smoothie.   Try some different cheeses for your [place] sandwiches (provolone, cheddar, muenster, colby jack, swiss) and consider use whole wheat bread.  Make it a point to cook meals on Saturday and Sunday that you will have leftovers from for your upcoming workweek.

## 2023-06-11 NOTE — Progress Notes (Signed)
Medical Nutrition Therapy  Appointment Start time:  1400 Appointment End time:  1440  Primary concerns today: Weight loss for knee replacement  Referral diagnosis: E66.01 - Obesity Preferred learning style: No preference indicated Learning readiness: Change in progress   NUTRITION ASSESSMENT   Anthropometrics: Ht: 72" Wt: 319.3 lbs BMI: 43.30 kg/m2 Goal Wt: <300 lbs to be eligible for R knee replacement Wt history: - 4.1 lbs. x2 months  Clinical Medical Hx: IFG, NASH, HTN, OSA, GERD Medications: Prednisone (PRN) Labs: A1c - 6.9%, LDL - 107 Notable Signs/Symptoms: None identified  Lifestyle & Dietary Hx Pt reports getting R knee surgery 04/23/2023, just recently got off of his walker and walking comfortably. Pt reports continuing to walk, doing 3 miles at a time, states they feel comfortable at this rate, usually take ~1 hr. Pt reports interest in playing pickleball for activity, wants to incorporate it into their physical activity routine. Pt reports they are now finishing most of their 70 oz. water bottle each day. Pt reports adding PB to their morning shake, states they enjoy it. Pt reports buying some skinless fish to try, eating more green beans. Pt states they are not as regular as they would like to be, inconsistent bowel movements, feels they aren't getting enough fiber. Pt reports difficulty finding time to cook, bust schedule, but would like to start meal prepping to have leftovers and lunches to bring to work.    Estimated daily fluid intake: ~64 oz, has a 64 oz. Bottle they keep with them Supplements: N/A Sleep: OSA, no longer on CPAP Stress / self-care: Moderate Current average weekly physical activity: Walks ~3 miles daily   24-Hr Dietary Recall First Meal: Banana smoothie (PB2, almond milk, 1 banana, 1/4 cup oats, 1 tsp. Vanilla extract, cinnamon) Snack: none Second Meal: 10 wings, Pepsi Snack: none Third Meal: 2 hot dogs, fries, coke Snack: Pepsi,  honeybun Beverages: Coke, Pepsi, 40 oz. Water   NUTRITION DIAGNOSIS  NB-1.1 Food and nutrition-related knowledge deficit As related to Obesity.  As evidenced by BMI of 43.86 kg/m2, diet history high in fat/fast food..   NUTRITION INTERVENTION  Nutrition education (E-1) on the following topics:  Educated patient on the two components of energy balance: Energy in (calories), and energy out (activity). Explain the role of negative energy balance in weight loss. Discussed options with patient to achieve a negative energy balance and how to best control energy in and energy out to accommodate their lifestyle.   Handouts Provided Include  Balanced Plate  Learning Style & Readiness for Change Teaching method utilized: Visual & Auditory  Demonstrated degree of understanding via: Teach Back  Barriers to learning/adherence to lifestyle change: None  Goals Established by Pt Add some intensity to your walks by increasing the pace, try to feel your heart rate rate increase and breathing getting a little faster/deeper. This will increase the benefit of your exercise! Add 2 Tbsp of PB2 to your smoothie for extra protein, and unsweetened milk. Consider adding either Metamucil, Benefiber, Psyllium husk daily to increase fiber. Try the gummies, or add the powder to your morning smoothie.  Try some different cheeses for your [place] sandwiches (provolone, cheddar, muenster, colby jack, swiss) and consider use whole wheat bread. Make it a point to cook meals on Saturday and Sunday that you will have leftovers from for your upcoming workweek.    MONITORING & EVALUATION Dietary intake, weekly physical activity, and weight loss in 2 months.  Next Steps  Patient is to call and  schedule follow up once recovered from procedure.

## 2023-06-12 DIAGNOSIS — M79661 Pain in right lower leg: Secondary | ICD-10-CM | POA: Diagnosis not present

## 2023-06-16 DIAGNOSIS — Z9889 Other specified postprocedural states: Secondary | ICD-10-CM | POA: Diagnosis not present

## 2023-06-16 DIAGNOSIS — M75102 Unspecified rotator cuff tear or rupture of left shoulder, not specified as traumatic: Secondary | ICD-10-CM | POA: Diagnosis not present

## 2023-06-17 DIAGNOSIS — M79661 Pain in right lower leg: Secondary | ICD-10-CM | POA: Diagnosis not present

## 2023-06-24 DIAGNOSIS — M79661 Pain in right lower leg: Secondary | ICD-10-CM | POA: Diagnosis not present

## 2023-07-01 DIAGNOSIS — M79661 Pain in right lower leg: Secondary | ICD-10-CM | POA: Diagnosis not present

## 2023-07-08 DIAGNOSIS — M79661 Pain in right lower leg: Secondary | ICD-10-CM | POA: Diagnosis not present

## 2023-07-09 DIAGNOSIS — R454 Irritability and anger: Secondary | ICD-10-CM | POA: Diagnosis not present

## 2023-07-09 DIAGNOSIS — Z6379 Other stressful life events affecting family and household: Secondary | ICD-10-CM | POA: Diagnosis not present

## 2023-07-09 DIAGNOSIS — F32A Depression, unspecified: Secondary | ICD-10-CM | POA: Diagnosis not present

## 2023-07-20 ENCOUNTER — Other Ambulatory Visit: Payer: Self-pay | Admitting: Physician Assistant

## 2023-07-20 MED ORDER — METHYLPREDNISOLONE 4 MG PO TBPK: ORAL_TABLET | ORAL | 0 refills | Status: DC

## 2023-08-04 ENCOUNTER — Ambulatory Visit: Payer: 59 | Admitting: Dietician

## 2023-08-06 ENCOUNTER — Other Ambulatory Visit: Payer: Self-pay

## 2023-08-06 DIAGNOSIS — M25561 Pain in right knee: Secondary | ICD-10-CM

## 2023-08-06 MED ORDER — METHYLPREDNISOLONE 4 MG PO TBPK
ORAL_TABLET | ORAL | 0 refills | Status: DC
Start: 2023-08-06 — End: 2023-09-08

## 2023-08-12 ENCOUNTER — Encounter: Payer: Self-pay | Admitting: Dietician

## 2023-08-12 ENCOUNTER — Encounter: Payer: 59 | Attending: Physician Assistant | Admitting: Dietician

## 2023-08-12 DIAGNOSIS — Z713 Dietary counseling and surveillance: Secondary | ICD-10-CM | POA: Insufficient documentation

## 2023-08-12 DIAGNOSIS — E669 Obesity, unspecified: Secondary | ICD-10-CM

## 2023-08-12 DIAGNOSIS — Z6841 Body Mass Index (BMI) 40.0 and over, adult: Secondary | ICD-10-CM | POA: Diagnosis not present

## 2023-08-12 NOTE — Progress Notes (Signed)
Medical Nutrition Therapy  Appointment Start time:  1130 Appointment End time:  1230  Primary concerns today: Weight loss for knee replacement  Referral diagnosis: E66.01 - Obesity Preferred learning style: No preference indicated Learning readiness: Contemplating   NUTRITION ASSESSMENT   Anthropometrics: Ht: 72" Wt: 319.3 lbs BMI: 43.30 kg/m2 Goal Wt: <300 lbs to be eligible for R knee replacement Wt history: No weight change x90 days   Clinical Medical Hx: IFG, NASH, HTN, OSA, GERD Medications: Prednisone, Labs: A1c - 6.9%, LDL - 107 Notable Signs/Symptoms: None identified   Lifestyle & Dietary Hx Pt reports motivation for change to be healthy for daughters and grandchildren. Pt reports starting at the gym but injured L knee and was unable to walk for last 2-3 weeks, currently feeling a lot better, states they want to incorporate resistance/strength training.  Pt reports history of weight loss achieved through significant amounts of walking/physical activity Pt reports increased arthritic pain in hands and wrists, taking prednisone PRN more frequently. Pt reports adding Benefiber to their morning shake, states they are seeing improvements in regularity. Switched to rye bread, provolone cheese, eating Atkins chocolates, eating protein bars in between breakfast and lunch or as a late night snack.   Estimated daily fluid intake: ~64 oz. Supplements: N/A Sleep: OSA, no longer on CPAP Stress / self-care: Moderate Current average weekly physical activity: Walks ~3 miles daily   24-Hr Dietary Recall First Meal: Banana smoothie (PB2, almond milk, 1 banana, 1/4 cup oats, 1 tsp. Vanilla extract, cinnamon) Snack: none Second Meal: Bowl of egg noodles, meatballs, tomato sauce, Pepsi Snack: none Third Meal: 3 pieces of meat lovers pizza, Pepsi Snack: Protein bar, small bag of chips (Cheetos), Coke Zero Beverages: Pepsi, Coke Zero, Water,   NUTRITION DIAGNOSIS  NB-1.1 Food  and nutrition-related knowledge deficit As related to Obesity.  As evidenced by BMI of 43.86 kg/m2, diet history high in fat/fast food..   NUTRITION INTERVENTION  Nutrition education (E-1) on the following topics:  Educated patient on the two components of energy balance: Energy in (calories), and energy out (activity). Explain the role of negative energy balance in weight loss. Discussed options with patient to achieve a negative energy balance and how to best control energy in and energy out to accommodate their lifestyle. NEW: Educated patient on hyper palatable/energy dense foods (fast food, packaged snacks, SSBs) and their role in weight change, glycemic control, and blood lipids.   Handouts Provided Include  Balanced Plate   Learning Style & Readiness for Change Teaching method utilized: Visual & Auditory  Demonstrated degree of understanding via: Teach Back  Barriers to learning/adherence to lifestyle change: Resistance to dietary change   Goals Established by Pt Check your fasting blood sugar at least one time each week, look for your numbers to stay under 130 mg/dL. Return to the gym as soon as your leg feels 100% and work allows. Increase your activity towards your goal of alternating days of walking and resistance training! Keep a couple of your protein bars and Atkins PB cups with you every day at work to ensure that you have some nutrition snacks with you if you have to work unusual hours. Move away from regular Pepsis as often as possible and choose ZERO sugar options in their place! Continue your weight loss towards your goal of under 300 lbs.   MONITORING & EVALUATION Dietary intake, weekly physical activity, and weight loss in 3 months.  Next Steps  Patient is to call and schedule follow up  once recovered from procedure.

## 2023-08-12 NOTE — Patient Instructions (Addendum)
Check your fasting blood sugar at least one time each week, look for your numbers to stay under 130 mg/dL.  Return to the gym as soon as your leg feels 100% and work allows. Increase your activity towards your goal of alternating days of walking and resistance training!  Keep a couple of your protein bars and Atkins PB cups with you every day at work to ensure that you have some nutrition snacks with you if you have to work unusual hours.  Move away from regular Pepsis as often as possible and choose ZERO sugar options in their place!  Continue your weight loss towards your goal of under 300 lbs.

## 2023-09-03 DIAGNOSIS — H25813 Combined forms of age-related cataract, bilateral: Secondary | ICD-10-CM | POA: Diagnosis not present

## 2023-09-04 DIAGNOSIS — H359 Unspecified retinal disorder: Secondary | ICD-10-CM | POA: Diagnosis not present

## 2023-09-04 DIAGNOSIS — H2513 Age-related nuclear cataract, bilateral: Secondary | ICD-10-CM | POA: Diagnosis not present

## 2023-09-04 DIAGNOSIS — Z79899 Other long term (current) drug therapy: Secondary | ICD-10-CM | POA: Diagnosis not present

## 2023-09-04 DIAGNOSIS — M069 Rheumatoid arthritis, unspecified: Secondary | ICD-10-CM | POA: Diagnosis not present

## 2023-09-08 ENCOUNTER — Other Ambulatory Visit: Payer: Self-pay

## 2023-09-08 DIAGNOSIS — M059 Rheumatoid arthritis with rheumatoid factor, unspecified: Secondary | ICD-10-CM

## 2023-09-08 DIAGNOSIS — M25561 Pain in right knee: Secondary | ICD-10-CM

## 2023-09-08 MED ORDER — METHYLPREDNISOLONE 4 MG PO TBPK
ORAL_TABLET | ORAL | 0 refills | Status: DC
Start: 2023-09-08 — End: 2023-10-08

## 2023-09-16 DIAGNOSIS — H25043 Posterior subcapsular polar age-related cataract, bilateral: Secondary | ICD-10-CM | POA: Insufficient documentation

## 2023-09-22 DIAGNOSIS — M1711 Unilateral primary osteoarthritis, right knee: Secondary | ICD-10-CM | POA: Diagnosis not present

## 2023-09-25 DIAGNOSIS — M059 Rheumatoid arthritis with rheumatoid factor, unspecified: Secondary | ICD-10-CM | POA: Diagnosis not present

## 2023-10-01 ENCOUNTER — Ambulatory Visit: Payer: Self-pay

## 2023-10-01 DIAGNOSIS — Z0000 Encounter for general adult medical examination without abnormal findings: Secondary | ICD-10-CM

## 2023-10-01 LAB — POCT URINALYSIS DIPSTICK
Bilirubin, UA: NEGATIVE
Blood, UA: NEGATIVE
Glucose, UA: NEGATIVE
Ketones, UA: NEGATIVE
Leukocytes, UA: NEGATIVE
Nitrite, UA: NEGATIVE
Protein, UA: NEGATIVE
Spec Grav, UA: >=1.03 — AB (ref 1.010–1.025)
Urobilinogen, UA: 0.2 U/dL
pH, UA: 5.5 (ref 5.0–8.0)

## 2023-10-03 LAB — CMP12+LP+TP+TSH+6AC+PSA+CBC…
ALT: 11 IU/L (ref 0–44)
AST: 9 IU/L (ref 0–40)
Albumin: 4 g/dL (ref 3.8–4.9)
Alkaline Phosphatase: 64 IU/L (ref 44–121)
BUN/Creatinine Ratio: 13 (ref 9–20)
BUN: 10 mg/dL (ref 6–24)
Basophils Absolute: 0 10*3/uL (ref 0.0–0.2)
Basos: 0 %
Bilirubin Total: 1.3 mg/dL — ABNORMAL HIGH (ref 0.0–1.2)
Calcium: 8.9 mg/dL (ref 8.7–10.2)
Chloride: 99 mmol/L (ref 96–106)
Chol/HDL Ratio: 2.7 ratio (ref 0.0–5.0)
Cholesterol, Total: 173 mg/dL (ref 100–199)
Creatinine, Ser: 0.79 mg/dL (ref 0.76–1.27)
EOS (ABSOLUTE): 0 10*3/uL (ref 0.0–0.4)
Eos: 0 %
Estimated CHD Risk: <0.5 times avg. (ref 0.0–1.0)
Free Thyroxine Index: 1.9 (ref 1.2–4.9)
GGT: 22 IU/L (ref 0–65)
Globulin, Total: 2.4 g/dL (ref 1.5–4.5)
Glucose: 179 mg/dL — ABNORMAL HIGH (ref 70–99)
HDL: 64 mg/dL (ref >39)
Hematocrit: 43.7 % (ref 37.5–51.0)
Hemoglobin: 14.2 g/dL (ref 13.0–17.7)
Immature Grans (Abs): 0 10*3/uL (ref 0.0–0.1)
Immature Granulocytes: 0 %
Iron: 48 ug/dL (ref 38–169)
LDH: 165 IU/L (ref 121–224)
LDL Chol Calc (NIH): 87 mg/dL (ref 0–99)
Lymphocytes Absolute: 0.8 10*3/uL (ref 0.7–3.1)
Lymphs: 8 %
MCH: 25.9 pg — ABNORMAL LOW (ref 26.6–33.0)
MCHC: 32.5 g/dL (ref 31.5–35.7)
MCV: 80 fL (ref 79–97)
Monocytes Absolute: 0.5 10*3/uL (ref 0.1–0.9)
Monocytes: 5 %
Neutrophils Absolute: 8.1 10*3/uL — ABNORMAL HIGH (ref 1.4–7.0)
Neutrophils: 87 %
Phosphorus: 3.3 mg/dL (ref 2.8–4.1)
Platelets: 292 10*3/uL (ref 150–450)
Potassium: 4.2 mmol/L (ref 3.5–5.2)
Prostate Specific Ag, Serum: 1.4 ng/mL (ref 0.0–4.0)
RBC: 5.49 x10E6/uL (ref 4.14–5.80)
RDW: 14.2 % (ref 11.6–15.4)
Sodium: 137 mmol/L (ref 134–144)
T3 Uptake Ratio: 29 % (ref 24–39)
T4, Total: 6.4 ug/dL (ref 4.5–12.0)
TSH: 0.292 u[IU]/mL — ABNORMAL LOW (ref 0.450–4.500)
Total Protein: 6.4 g/dL (ref 6.0–8.5)
Triglycerides: 128 mg/dL (ref 0–149)
Uric Acid: 3.8 mg/dL (ref 3.8–8.4)
VLDL Cholesterol Cal: 22 mg/dL (ref 5–40)
WBC: 9.4 10*3/uL (ref 3.4–10.8)
eGFR: 104 mL/min/{1.73_m2} (ref >59)

## 2023-10-06 DIAGNOSIS — F419 Anxiety disorder, unspecified: Secondary | ICD-10-CM | POA: Diagnosis not present

## 2023-10-06 LAB — HGB A1C W/O EAG: Hgb A1c MFr Bld: 7.8 % — ABNORMAL HIGH (ref 4.8–5.6)

## 2023-10-06 LAB — SPECIMEN STATUS REPORT

## 2023-10-08 ENCOUNTER — Ambulatory Visit: Payer: Self-pay | Admitting: Physician Assistant

## 2023-10-08 ENCOUNTER — Encounter: Payer: Self-pay | Admitting: Physician Assistant

## 2023-10-08 VITALS — BP 133/94 | HR 91 | Temp 98.2°F | Resp 16 | Ht 72.0 in | Wt 318.0 lb

## 2023-10-08 DIAGNOSIS — Z0000 Encounter for general adult medical examination without abnormal findings: Secondary | ICD-10-CM

## 2023-10-08 MED ORDER — PSEUDOEPH-BROMPHEN-DM 30-2-10 MG/5ML PO SYRP: 5.0000 mL | ORAL_SOLUTION | Freq: Four times a day (QID) | ORAL | 0 refills | Status: DC | PRN
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
## 2023-10-08 NOTE — Progress Notes (Signed)
 Pt presents today to complete physical. Pt complains of sinus drainage and dry cough.

## 2023-10-14 ENCOUNTER — Ambulatory Visit: Payer: Self-pay

## 2023-10-14 DIAGNOSIS — H6123 Impacted cerumen, bilateral: Secondary | ICD-10-CM

## 2023-10-14 NOTE — Progress Notes (Signed)
 Pt presents today for bilateral ear cerumen impaction. Attempted to scrape out wax and ear lavage and the wax is still to hard to pass through. Per Laray Anger he is to continue using wax drops and return Tuesday for 2nd attempt Pt understands.

## 2023-10-16 DIAGNOSIS — M059 Rheumatoid arthritis with rheumatoid factor, unspecified: Secondary | ICD-10-CM | POA: Diagnosis not present

## 2023-10-20 ENCOUNTER — Ambulatory Visit: Payer: Self-pay | Admitting: Physician Assistant

## 2023-10-20 DIAGNOSIS — H6120 Impacted cerumen, unspecified ear: Secondary | ICD-10-CM

## 2023-10-20 DIAGNOSIS — H6123 Impacted cerumen, bilateral: Secondary | ICD-10-CM

## 2023-10-20 NOTE — Progress Notes (Signed)
 Ear irrigation was successful, pt tolerated well for both ears. Per Ron smith,PA-C pt is cleared.

## 2023-10-20 NOTE — Progress Notes (Signed)
 Here to follow up with ear irrigation with excess wax.  Attempt again at wax removal with irrigation to ears.  Denies any complaints after last irrigation.

## 2023-10-26 ENCOUNTER — Emergency Department
Admission: EM | Admit: 2023-10-26 | Discharge: 2023-10-26 | Discharge status: Against Medical Advice | Attending: Physician Assistant | Admitting: Physician Assistant

## 2023-10-26 ENCOUNTER — Other Ambulatory Visit: Payer: Self-pay

## 2023-10-26 DIAGNOSIS — R42 Dizziness and giddiness: Secondary | ICD-10-CM | POA: Diagnosis not present

## 2023-10-26 DIAGNOSIS — R5383 Other fatigue: Secondary | ICD-10-CM | POA: Insufficient documentation

## 2023-10-26 DIAGNOSIS — R051 Acute cough: Secondary | ICD-10-CM | POA: Diagnosis not present

## 2023-10-26 DIAGNOSIS — R630 Anorexia: Secondary | ICD-10-CM | POA: Insufficient documentation

## 2023-10-26 DIAGNOSIS — R109 Unspecified abdominal pain: Secondary | ICD-10-CM | POA: Insufficient documentation

## 2023-10-26 DIAGNOSIS — R61 Generalized hyperhidrosis: Secondary | ICD-10-CM | POA: Diagnosis not present

## 2023-10-26 DIAGNOSIS — R531 Weakness: Secondary | ICD-10-CM | POA: Diagnosis not present

## 2023-10-26 DIAGNOSIS — Z03818 Encounter for observation for suspected exposure to other biological agents ruled out: Secondary | ICD-10-CM | POA: Diagnosis not present

## 2023-10-26 DIAGNOSIS — I499 Cardiac arrhythmia, unspecified: Secondary | ICD-10-CM | POA: Diagnosis not present

## 2023-10-26 DIAGNOSIS — M545 Low back pain, unspecified: Secondary | ICD-10-CM | POA: Diagnosis not present

## 2023-10-26 DIAGNOSIS — Z5321 Procedure and treatment not carried out due to patient leaving prior to being seen by health care provider: Secondary | ICD-10-CM | POA: Diagnosis not present

## 2023-10-26 DIAGNOSIS — N390 Urinary tract infection, site not specified: Secondary | ICD-10-CM | POA: Diagnosis not present

## 2023-10-26 DIAGNOSIS — R631 Polydipsia: Secondary | ICD-10-CM | POA: Diagnosis not present

## 2023-10-26 DIAGNOSIS — R350 Frequency of micturition: Secondary | ICD-10-CM | POA: Diagnosis not present

## 2023-10-26 LAB — COMPREHENSIVE METABOLIC PANEL WITH GFR
ALT: 15 U/L (ref 0–44)
AST: 16 U/L (ref 15–41)
Albumin: 3.2 g/dL — ABNORMAL LOW (ref 3.5–5.0)
Alkaline Phosphatase: 49 U/L (ref 38–126)
Anion gap: 14 (ref 5–15)
BUN: 27 mg/dL — ABNORMAL HIGH (ref 6–20)
CO2: 20 mmol/L — ABNORMAL LOW (ref 22–32)
Calcium: 9.3 mg/dL (ref 8.9–10.3)
Chloride: 92 mmol/L — ABNORMAL LOW (ref 98–111)
Creatinine, Ser: 1.78 mg/dL — ABNORMAL HIGH (ref 0.61–1.24)
GFR, Estimated: 44 mL/min — ABNORMAL LOW (ref >60)
Glucose, Bld: 223 mg/dL — ABNORMAL HIGH (ref 70–99)
Potassium: 3.5 mmol/L (ref 3.5–5.1)
Sodium: 126 mmol/L — ABNORMAL LOW (ref 135–145)
Total Bilirubin: 4.1 mg/dL — ABNORMAL HIGH (ref 0.0–1.2)
Total Protein: 7.3 g/dL (ref 6.5–8.1)

## 2023-10-26 LAB — CBC WITH DIFFERENTIAL/PLATELET
Abs Immature Granulocytes: 0.14 10*3/uL — ABNORMAL HIGH (ref 0.00–0.07)
Basophils Absolute: 0 10*3/uL (ref 0.0–0.1)
Basophils Relative: 0 %
Eosinophils Absolute: 0.1 10*3/uL (ref 0.0–0.5)
Eosinophils Relative: 0 %
HCT: 41.8 % (ref 39.0–52.0)
Hemoglobin: 14.2 g/dL (ref 13.0–17.0)
Immature Granulocytes: 1 %
Lymphocytes Relative: 9 %
Lymphs Abs: 1.1 10*3/uL (ref 0.7–4.0)
MCH: 25.8 pg — ABNORMAL LOW (ref 26.0–34.0)
MCHC: 34 g/dL (ref 30.0–36.0)
MCV: 75.9 fL — ABNORMAL LOW (ref 80.0–100.0)
Monocytes Absolute: 1 10*3/uL (ref 0.1–1.0)
Monocytes Relative: 8 %
Neutro Abs: 10.1 10*3/uL — ABNORMAL HIGH (ref 1.7–7.7)
Neutrophils Relative %: 82 %
Platelets: 231 10*3/uL (ref 150–400)
RBC: 5.51 MIL/uL (ref 4.22–5.81)
RDW: 13.7 % (ref 11.5–15.5)
WBC: 12.5 10*3/uL — ABNORMAL HIGH (ref 4.0–10.5)
nRBC: 0 % (ref 0.0–0.2)

## 2023-10-26 MED ORDER — HEPARIN SODIUM (PORCINE) 1000 UNIT/ML IJ SOLN
INTRAMUSCULAR | Status: AC
  Filled 2023-10-26: qty 10

## 2023-10-26 NOTE — ED Notes (Addendum)
 Patient made aware room is ready for him. He stated " I am leaving. You can give my room away"  Patient told many other staff members also that he was leaving. Patient refused to go to room

## 2023-10-26 NOTE — ED Provider Triage Note (Signed)
 Emergency Medicine Provider Triage Evaluation Note  Jeremy West, a 58 y.o. male  was evaluated in triage.  Pt complains of dizziness with onset Friday, diarrhea, chills, urinary urgency.  Patient would also endorse decreased appetite and excessive thirst.  He presented to [facility], for evaluation, and was advised to report to the ED for further evaluation and workup.  He presents to the ED endorsing flank pain and concern for possible kidney infection versus kidney stones.  Review of Systems  Positive: Weakness, back pain Negative: FCS  Physical Exam  There were no vitals taken for this visit. Gen:   Awake, no distress NAD Resp:  Normal effort CTA MSK:   Moves extremities without difficulty  ABD:  Soft and nontender.  Mild right flank pain on exam.  Medical Decision Making  Medically screening exam initiated at 12:30 PM.  Appropriate orders placed.  Jeremy West was informed that the remainder of the evaluation will be completed by another provider, this initial triage assessment does not replace that evaluation, and the importance of remaining in the ED until their evaluation is complete.  Patient presents to the ED from [facility] urgent care, for evaluation of generalized weakness, fatigue, decreased appetite, and back/flank pain.  Patient reports a remote history of kidney stones.  He also endorses prediabetes as his history.   Lissa Hoard, PA-C 10/26/23 1347

## 2023-10-26 NOTE — ED Triage Notes (Signed)
 Patient sent over from [facility] for further workup; reports low back pain and weakness. Had negative covid/flu/rsv.

## 2023-10-26 NOTE — ED Triage Notes (Signed)
 First nurse note: Brought from [facility]. Diaphoretic, lower back pain, and pale. UA concerning for UTI at [facility] MD concerned for urosepsis.   [facility] vitals: 135/94b/p 61HR 99oral 97% RA

## 2023-10-27 ENCOUNTER — Telehealth: Payer: Self-pay | Admitting: Emergency Medicine

## 2023-10-27 DIAGNOSIS — I491 Atrial premature depolarization: Secondary | ICD-10-CM | POA: Diagnosis not present

## 2023-10-27 NOTE — Telephone Encounter (Signed)
Called patient due to left emergency department before provider exam to inquire about condition and follow up plans. Left message asking him to call me.

## 2023-11-03 ENCOUNTER — Encounter: Payer: Self-pay | Admitting: Internal Medicine

## 2023-11-10 DIAGNOSIS — I491 Atrial premature depolarization: Secondary | ICD-10-CM | POA: Diagnosis not present

## 2023-11-10 DIAGNOSIS — R739 Hyperglycemia, unspecified: Secondary | ICD-10-CM | POA: Diagnosis not present

## 2023-11-10 DIAGNOSIS — E878 Other disorders of electrolyte and fluid balance, not elsewhere classified: Secondary | ICD-10-CM | POA: Diagnosis not present

## 2023-11-10 DIAGNOSIS — R9431 Abnormal electrocardiogram [ECG] [EKG]: Secondary | ICD-10-CM | POA: Diagnosis not present

## 2023-11-10 DIAGNOSIS — Z0189 Encounter for other specified special examinations: Secondary | ICD-10-CM | POA: Diagnosis not present

## 2023-11-10 DIAGNOSIS — Z7984 Long term (current) use of oral hypoglycemic drugs: Secondary | ICD-10-CM | POA: Diagnosis not present

## 2023-11-11 ENCOUNTER — Ambulatory Visit: Payer: 59 | Admitting: Dietician

## 2023-11-13 ENCOUNTER — Ambulatory Visit: Payer: Self-pay

## 2023-11-13 NOTE — Progress Notes (Signed)
 Presents to [facility] requesting BP check. Reports S/P week long hospitalization for Cellulitis. Today's the 1st day since D/C he's felt good - said he had been using a walker with ambulation.  Not using it today. States follow-up will be with the [place].

## 2023-11-14 ENCOUNTER — Emergency Department
Admission: EM | Admit: 2023-11-14 | Discharge: 2023-11-14 | Disposition: A | Discharge status: Home / Self Care | Attending: Emergency Medicine | Admitting: Emergency Medicine

## 2023-11-14 ENCOUNTER — Emergency Department

## 2023-11-14 DIAGNOSIS — R531 Weakness: Secondary | ICD-10-CM | POA: Diagnosis not present

## 2023-11-14 DIAGNOSIS — R7989 Other specified abnormal findings of blood chemistry: Secondary | ICD-10-CM | POA: Insufficient documentation

## 2023-11-14 DIAGNOSIS — R778 Other specified abnormalities of plasma proteins: Secondary | ICD-10-CM | POA: Diagnosis not present

## 2023-11-14 DIAGNOSIS — E860 Dehydration: Secondary | ICD-10-CM | POA: Insufficient documentation

## 2023-11-14 DIAGNOSIS — I480 Paroxysmal atrial fibrillation: Secondary | ICD-10-CM | POA: Insufficient documentation

## 2023-11-14 DIAGNOSIS — E119 Type 2 diabetes mellitus without complications: Secondary | ICD-10-CM | POA: Diagnosis not present

## 2023-11-14 LAB — BASIC METABOLIC PANEL WITH GFR
Anion gap: 12 (ref 5–15)
BUN: 12 mg/dL (ref 6–20)
CO2: 22 mmol/L (ref 22–32)
Calcium: 8.8 mg/dL — ABNORMAL LOW (ref 8.9–10.3)
Chloride: 99 mmol/L (ref 98–111)
Creatinine, Ser: 1.91 mg/dL — ABNORMAL HIGH (ref 0.61–1.24)
GFR, Estimated: 40 mL/min — ABNORMAL LOW (ref >60)
Glucose, Bld: 156 mg/dL — ABNORMAL HIGH (ref 70–99)
Potassium: 3.2 mmol/L — ABNORMAL LOW (ref 3.5–5.1)
Sodium: 133 mmol/L — ABNORMAL LOW (ref 135–145)

## 2023-11-14 LAB — TROPONIN I (HIGH SENSITIVITY)
Troponin I (High Sensitivity): 21 ng/L — ABNORMAL HIGH (ref <18)
Troponin I (High Sensitivity): 20 ng/L — ABNORMAL HIGH (ref <18)

## 2023-11-14 LAB — CBC
HCT: 37.6 % — ABNORMAL LOW (ref 39.0–52.0)
Hemoglobin: 12.3 g/dL — ABNORMAL LOW (ref 13.0–17.0)
MCH: 25.8 pg — ABNORMAL LOW (ref 26.0–34.0)
MCHC: 32.7 g/dL (ref 30.0–36.0)
MCV: 79 fL — ABNORMAL LOW (ref 80.0–100.0)
Platelets: 261 10*3/uL (ref 150–400)
RBC: 4.76 MIL/uL (ref 4.22–5.81)
RDW: 14.6 % (ref 11.5–15.5)
WBC: 6.6 10*3/uL (ref 4.0–10.5)
nRBC: 0 % (ref 0.0–0.2)

## 2023-11-14 MED ORDER — SODIUM CHLORIDE 0.9 % IV BOLUS
1000.0000 mL | Freq: Once | INTRAVENOUS | Status: AC
  Administered 2023-11-14: 1000 mL via INTRAVENOUS

## 2023-11-14 MED ORDER — METOPROLOL TARTRATE 25 MG PO TABS: 25.0000 mg | ORAL_TABLET | Freq: Two times a day (BID) | ORAL | 11 refills | Status: DC

## 2023-11-14 MED ORDER — METOCLOPRAMIDE HCL 10 MG PO TABS: 10.0000 mg | ORAL_TABLET | Freq: Three times a day (TID) | ORAL | 1 refills | Status: DC

## 2023-11-14 NOTE — ED Provider Notes (Signed)
 [facility] Provider Note   Event Date/Time   First MD Initiated Contact with Patient 11/14/23 1457     (approximate) History  Weakness  HPI Jeremy West is a 58 y.o. male with a past medical history of paroxysmal atrial fibrillation and type 2 diabetes who presents complaining of generalized weakness is not present the last 2 weeks.  Patient states that he has been on antibiotics for a cellulitis for which he was hospitalized 2 weeks prior to arrival.  Patient also states that he has had increased urinary frequency over this time and has been nauseous after eating.  Patient also states that he did start metformin after leaving the hospital. ROS: Patient currently denies any vision changes, tinnitus, difficulty speaking, facial droop, sore throat, chest pain, shortness of breath, abdominal pain, nausea/vomiting/diarrhea, dysuria, or numbness/paresthesias in any extremity   Physical Exam  Triage Vital Signs: ED Triage Vitals  Encounter Vitals Group     BP 11/14/23 1259 110/86     Systolic BP Percentile --      Diastolic BP Percentile --      Pulse Rate 11/14/23 1259 94     Resp 11/14/23 1259 18     Temp 11/14/23 1259 98.7 F (37.1 C)     Temp Source 11/14/23 1259 Oral     SpO2 11/14/23 1259 98 %     Weight 11/14/23 1256 (!) 316 lb (143.3 kg)     Height 11/14/23 1256 6' (1.829 m)     Head Circumference --      Peak Flow --      Pain Score 11/14/23 1255 0     Pain Loc --      Pain Education --      Exclude from Growth Chart --    Most recent vital signs: Vitals:   11/14/23 1259  BP: 110/86  Pulse: 94  Resp: 18  Temp: 98.7 F (37.1 C)  SpO2: 98%   General: Awake, oriented x4. CV:  Good peripheral perfusion.  Resp:  Normal effort.  Abd:  No distention.  Other:  Obese middle-aged Caucasian male resting comfortably in no acute distress ED Results / Procedures / Treatments  Labs (all labs ordered are listed, but only abnormal results are  displayed) Labs Reviewed  BASIC METABOLIC PANEL WITH GFR - Abnormal; Notable for the following components:      Result Value   Sodium 133 (*)    Potassium 3.2 (*)    Glucose, Bld 156 (*)    Creatinine, Ser 1.91 (*)    Calcium  8.8 (*)    GFR, Estimated 40 (*)    All other components within normal limits  CBC - Abnormal; Notable for the following components:   Hemoglobin 12.3 (*)    HCT 37.6 (*)    MCV 79.0 (*)    MCH 25.8 (*)    All other components within normal limits  TROPONIN I (HIGH SENSITIVITY) - Abnormal; Notable for the following components:   Troponin I (High Sensitivity) 21 (*)    All other components within normal limits  TROPONIN I (HIGH SENSITIVITY) - Abnormal; Notable for the following components:   Troponin I (High Sensitivity) 20 (*)    All other components within normal limits   EKG ED ECG REPORT I, Charleen Conn, the attending physician, personally viewed and interpreted this ECG. Date: 11/14/2023 EKG Time: 1255 Rate: 95 Rhythm: normal sinus rhythm QRS Axis: normal Intervals: normal ST/T Wave abnormalities: normal Narrative Interpretation:  no evidence of acute ischemia RADIOLOGY ED MD interpretation: 2 view chest x-ray interpreted by me shows no evidence of acute abnormalities including no pneumonia, pneumothorax, or widened mediastinum -Agree with radiology assessment Official radiology report(s): DG Chest 2 View Result Date: 11/14/2023 CLINICAL DATA:  Weakness and shortness of breath EXAM: CHEST - 2 VIEW COMPARISON:  10/26/2023 from [facility] FINDINGS: Lateral view degraded by patient arm position. Midline trachea. Normal heart size and mediastinal contours. No pleural effusion or pneumothorax. Mild left base scarring laterally. IMPRESSION: No active cardiopulmonary disease. Electronically Signed   By: Lore Rode M.D.   On: 11/14/2023 13:52   PROCEDURES: Critical Care performed: No .1-3 Lead EKG Interpretation  Performed by: Charleen Conn,  MD Authorized by: Charleen Conn, MD     Interpretation: normal     ECG rate:  91   ECG rate assessment: normal     Rhythm: sinus rhythm     Ectopy: none     Conduction: normal    MEDICATIONS ORDERED IN ED: Medications  sodium chloride  0.9 % bolus 1,000 mL (has no administration in time range)   IMPRESSION / MDM / ASSESSMENT AND PLAN / ED COURSE  I reviewed the triage vital signs and the nursing notes.                             The patient is on the cardiac monitor to evaluate for evidence of arrhythmia and/or significant heart rate changes. Patient's presentation is most consistent with acute presentation with potential threat to life or bodily function.  This patient presents to the ED for concern of generalized weakness, this involves an extensive number of treatment options, and is a complaint that carries with it a high risk of complications and morbidity.  The differential diagnosis includes but is not limited to CVA, CHF, arrhythmia, sepsis, DKA, dehydration Co morbidities that complicate the patient evaluation  Obesity, type 2 diabetes, paroxysmal atrial fibrillation Additional history obtained:  Additional history obtained from patient's daughter at bedside  External records from outside source obtained and reviewed including office visit from 10/26/2023 for urinary frequency Lab Tests:  I Ordered, and personally interpreted labs.  The pertinent results include:  Decrease in kidney function from 2 weeks ago with creatinine of 1.9 down from 1.7, troponin 21-->20 Imaging Studies ordered:  I ordered imaging studies including chest x-ray  I independently visualized and interpreted imaging which showed no evidence of acute abnormalities  I agree with the radiologist interpretation Cardiac Monitoring: / EKG:  The patient was maintained on a cardiac monitor.  I personally viewed and interpreted the cardiac monitored which showed an underlying rhythm of: Normal sinus  rhythm Problem List / ED Course / Critical interventions / Medication management  Dehydration, elevated troponin  I ordered medication including normal saline for dehydration  Reevaluation of the patient after these medicines showed that the patient improved  I have reviewed the patients home medicines and have made adjustments as needed Test / Admission - Considered:  I considered admission for this patient however he is not having any active chest pain or palpitations.  Patient is not having any decrease in urination and therefore do not believe this is acute renal failure. Dispo: Discharge home with PCP follow-up       FINAL CLINICAL IMPRESSION(S) / ED DIAGNOSES   Final diagnoses:  Generalized weakness  Dehydration  Elevated troponin  Paroxysmal atrial fibrillation (HCC)   Rx /  DC Orders   ED Discharge Orders     None      Note:  This document was prepared using Dragon voice recognition software and may include unintentional dictation errors.   Charleen Conn, MD 11/14/23 [phone]

## 2023-11-14 NOTE — ED Triage Notes (Signed)
 Patient here POV for weakness, SOB and believes he is in a-fib. Pt has a hx of a-fib. Denies blood thinners or medication for a-fib. Patient in no apparent distress at this time.

## 2023-11-17 IMAGING — CR DG KNEE COMPLETE 4+V*R*
1 series · 4 of 4 positions shown · non-contrast
Comparison: No recent.

CLINICAL DATA: Twisting injury. Acute right knee pain. History of
prior surgery.

EXAM:
RIGHT KNEE - COMPLETE 4+ VIEW

[Series 1: dg knee complete 4 views right · 0.14mm/px · 4 of 4 slices shown]
[im 1/4]
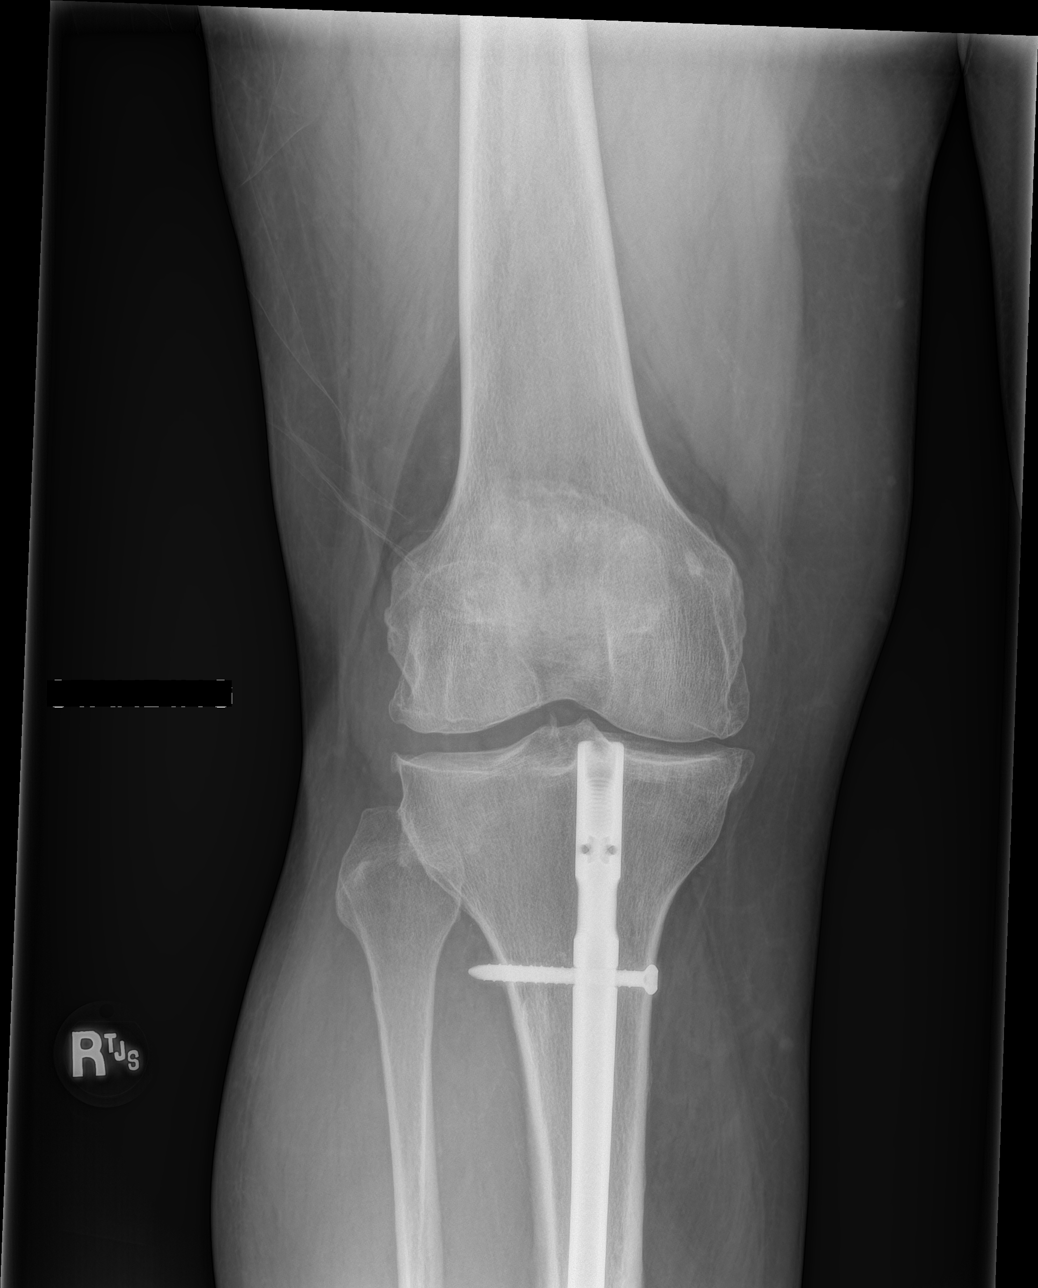
[im 2/4]
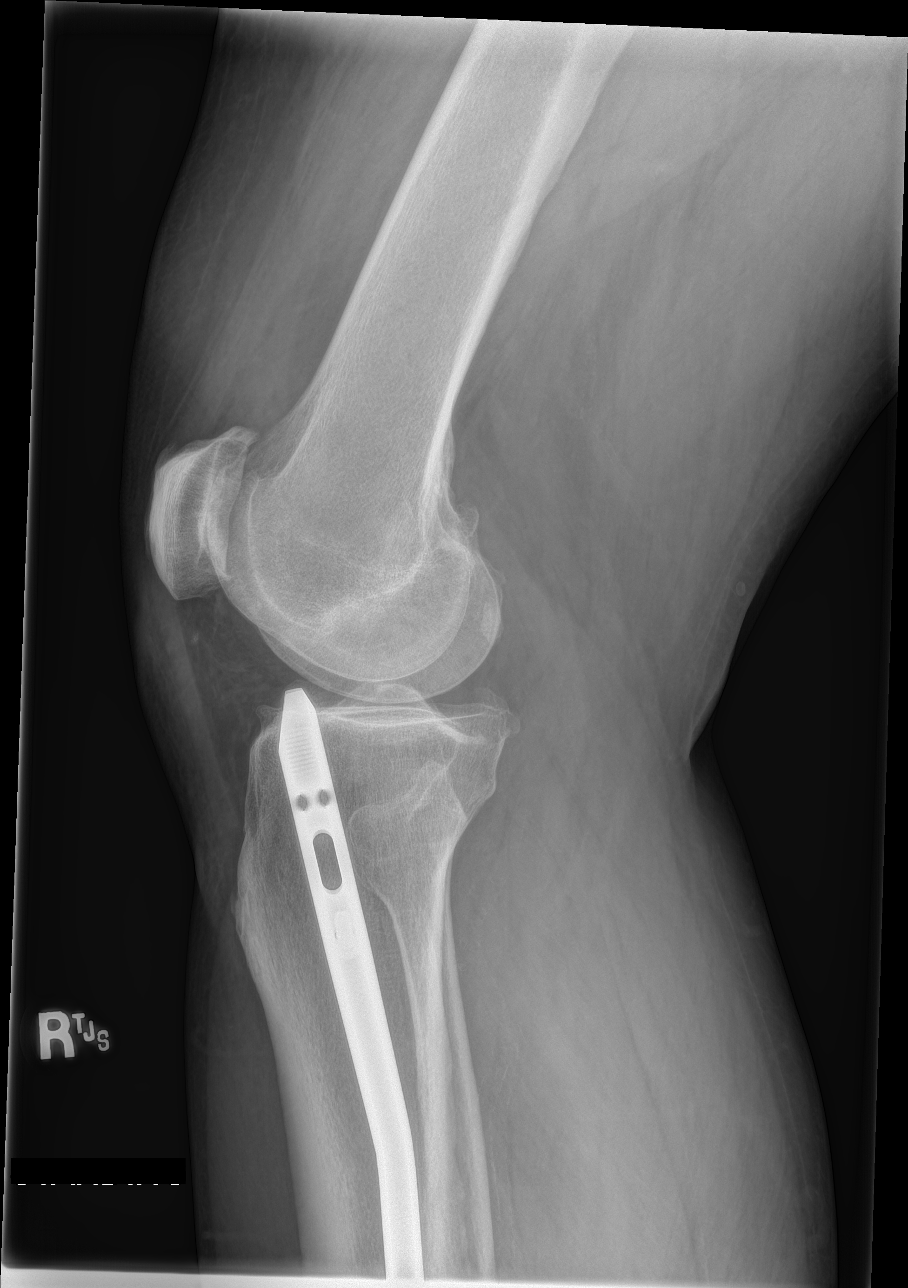
[im 3/4]
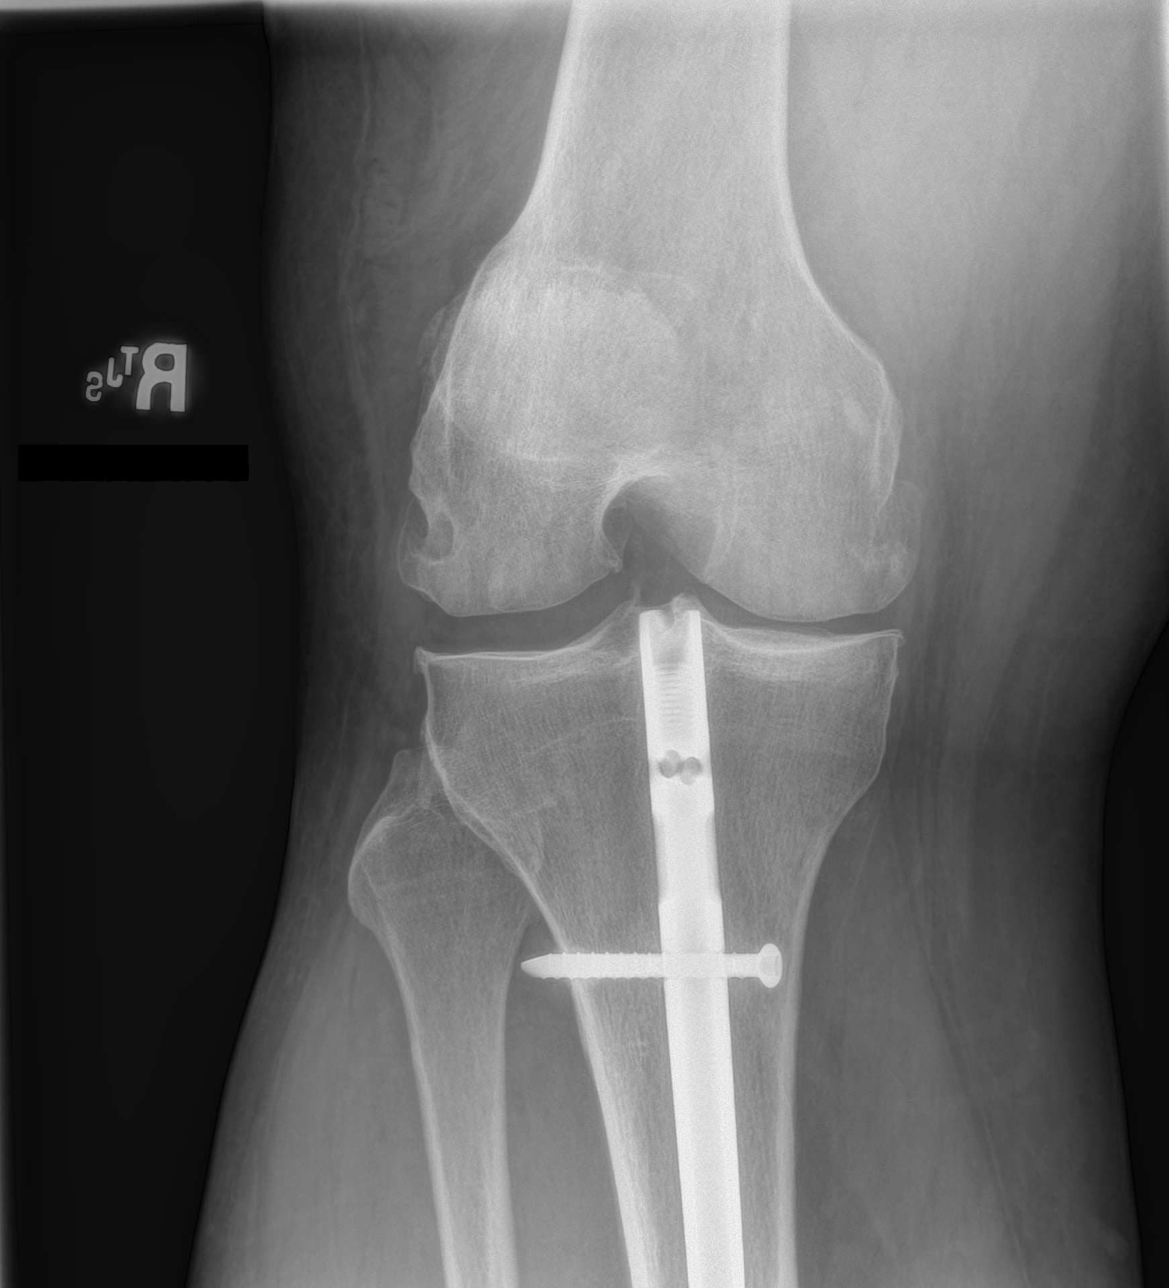
[im 4/4]
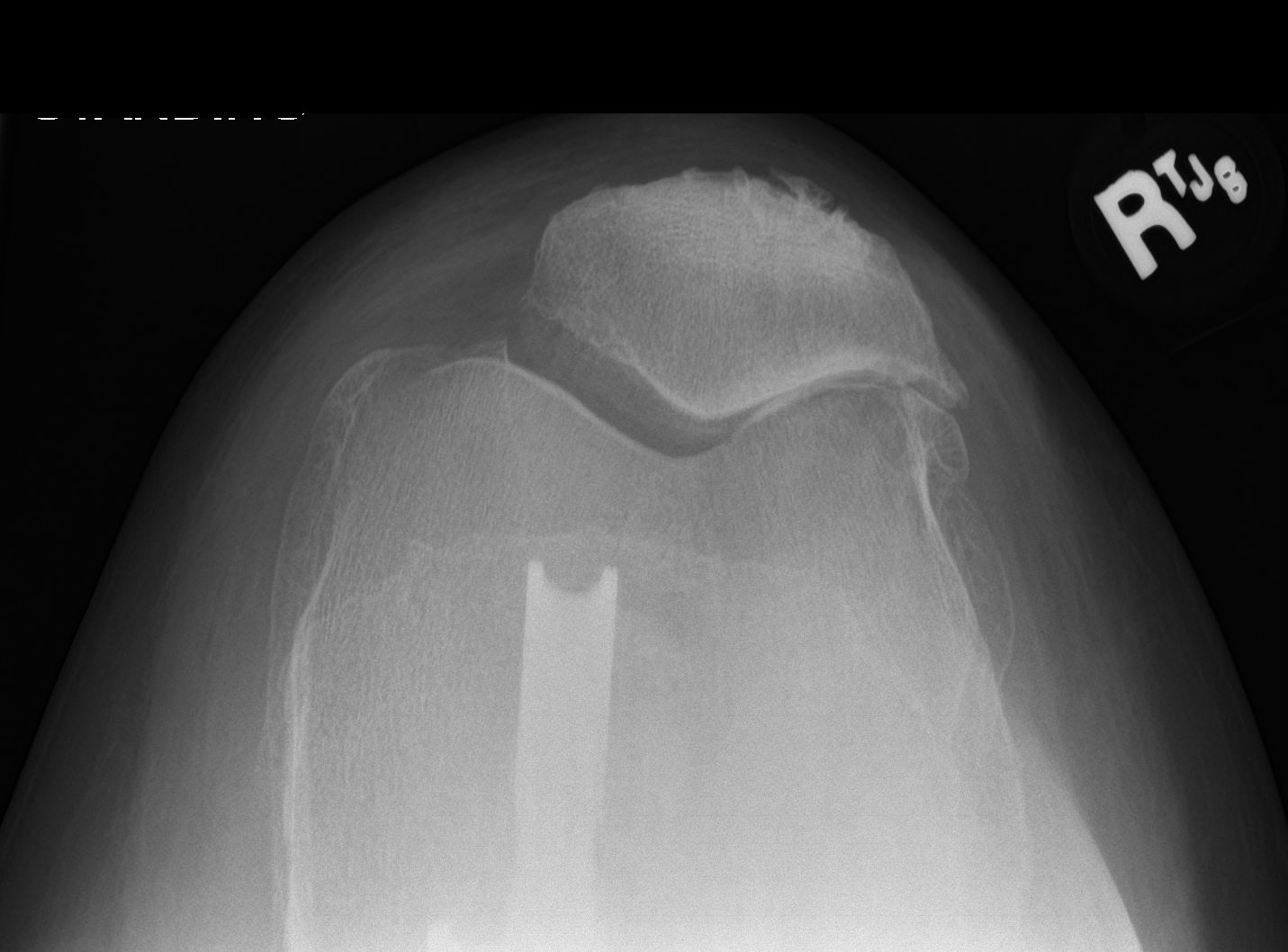

[4 of 4 positions shown; findings below may reference images not displayed]

FINDINGS: Tibial intramedullary rod and screw noted. Hardware intact. Anatomic
alignment. Diffuse tricompartment degenerative change. Faint lucency
noted along the superolateral aspect of the patella. A subtle
patellar fracture, age undetermined, cannot be excluded. Tiny
sclerotic density noted in the distal right femur most likely a
benign bone island. No other focal or acute bony abnormality
identified. Small knee joint effusion.
IMPRESSION: 1. Postsurgical changes of the tibia. Hardware intact. Anatomic
alignment.

2.  Diffuse tricompartment degenerative change.

3. Faint lucency noted along the superolateral aspect of the
patella. A subtle patellar fracture cannot be excluded. Age is
undetermined.

4.  Small knee joint effusion.

## 2023-11-26 DIAGNOSIS — I480 Paroxysmal atrial fibrillation: Secondary | ICD-10-CM | POA: Diagnosis not present

## 2023-11-30 DIAGNOSIS — R739 Hyperglycemia, unspecified: Secondary | ICD-10-CM | POA: Diagnosis not present

## 2023-11-30 DIAGNOSIS — M0579 Rheumatoid arthritis with rheumatoid factor of multiple sites without organ or systems involvement: Secondary | ICD-10-CM | POA: Diagnosis not present

## 2023-11-30 DIAGNOSIS — E119 Type 2 diabetes mellitus without complications: Secondary | ICD-10-CM | POA: Diagnosis not present

## 2023-11-30 DIAGNOSIS — Z79899 Other long term (current) drug therapy: Secondary | ICD-10-CM | POA: Diagnosis not present

## 2023-11-30 DIAGNOSIS — I4891 Unspecified atrial fibrillation: Secondary | ICD-10-CM | POA: Diagnosis not present

## 2023-11-30 DIAGNOSIS — N289 Disorder of kidney and ureter, unspecified: Secondary | ICD-10-CM | POA: Diagnosis not present

## 2023-11-30 DIAGNOSIS — L03115 Cellulitis of right lower limb: Secondary | ICD-10-CM | POA: Diagnosis not present

## 2023-12-03 ENCOUNTER — Encounter: Payer: Self-pay | Admitting: Ophthalmology

## 2023-12-03 NOTE — Anesthesia Preprocedure Evaluation (Addendum)
 Anesthesia Evaluation  Patient identified by MRN, date of birth, ID band Patient awake    Reviewed: Allergy & Precautions, H&P , NPO status , Patient's Chart, lab work & pertinent test results  Airway Mallampati: II  TM Distance: >3 FB Neck ROM: Full    Dental no notable dental hx.    Pulmonary neg pulmonary ROS, shortness of breath, sleep apnea    Pulmonary exam normal breath sounds clear to auscultation       Cardiovascular hypertension, negative cardio ROS Normal cardiovascular exam Rhythm:Regular Rate:Normal  11-09-20 echo FINDINGS   Left Ventricle: Left ventricular ejection fraction, by estimation, is 60  to 65%. The left ventricle has normal function. The left ventricle has no  regional wall motion abnormalities. Definity  contrast agent was given IV  to delineate the left ventricular   endocardial borders. The left ventricular internal cavity size was normal  in size. There is no left ventricular hypertrophy. Left ventricular  diastolic parameters are consistent with Grade I diastolic dysfunction  (impaired relaxation).   Right Ventricle: The right ventricular size is normal. No increase in  right ventricular wall thickness. Right ventricular systolic function is  normal. Tricuspid regurgitation signal is inadequate for assessing PA  pressure.     Neuro/Psych  PSYCHIATRIC DISORDERS  Depression    negative neurological ROS  negative psych ROS   GI/Hepatic negative GI ROS, Neg liver ROS,GERD  ,,(+) Hepatitis -  Endo/Other  negative endocrine ROSdiabetes    Renal/GU negative Renal ROS  negative genitourinary   Musculoskeletal negative musculoskeletal ROS (+) Arthritis ,    Abdominal   Peds negative pediatric ROS (+)  Hematology negative hematology ROS (+)   Anesthesia Other Findings   Hypertension  PAF (paroxysmal atrial fibrillation) ( GERD (gastroesophageal reflux disease) Morbid obesity  History of  echocardiogram  Dilated aortic root  Rheumatoid arthritis   Tubular adenoma of colon Villous adenoma of colon  OA (osteoarthritis) Seropositive rheumatoid arthritis  Acute cholecystitis Depression  NASH (nonalcoholic steatohepatitis) Hyperglycemia  Abnormal transaminases OSA on CPAP History of kidney stones DM (diabetes mellitus), type 2  Paroxysmal atrial fibrillation  Grade I diastolic dysfunction      Reproductive/Obstetrics negative OB ROS                             Anesthesia Physical Anesthesia Plan  ASA: 3  Anesthesia Plan: MAC   Post-op Pain Management:    Induction: Intravenous  PONV Risk Score and Plan:   Airway Management Planned: Natural Airway and Nasal Cannula  Additional Equipment:   Intra-op Plan:   Post-operative Plan:   Informed Consent: I have reviewed the patients History and Physical, chart, labs and discussed the procedure including the risks, benefits and alternatives for the proposed anesthesia with the patient or authorized representative who has indicated his/her understanding and acceptance.     Dental Advisory Given  Plan Discussed with: Anesthesiologist, CRNA and Surgeon  Anesthesia Plan Comments: (Patient consented for risks of anesthesia including but not limited to:  - adverse reactions to medications - damage to eyes, teeth, lips or other oral mucosa - nerve damage due to positioning  - sore throat or hoarseness - Damage to heart, brain, nerves, lungs, other parts of body or loss of life  Patient voiced understanding and assent.)        Anesthesia Quick Evaluation

## 2023-12-03 NOTE — Discharge Instructions (Signed)

## 2023-12-07 ENCOUNTER — Encounter: Payer: Self-pay | Admitting: Physician Assistant

## 2023-12-07 ENCOUNTER — Ambulatory Visit: Payer: Self-pay | Admitting: Physician Assistant

## 2023-12-07 VITALS — BP 110/72 | HR 90 | Temp 97.8°F | Resp 16

## 2023-12-07 DIAGNOSIS — Z7689 Persons encountering health services in other specified circumstances: Secondary | ICD-10-CM

## 2023-12-07 NOTE — Progress Notes (Signed)
   Subjective: Returned to work evaluation    Patient ID: Jeremy West, male    DOB: Apr 17, 1966, 58 y.o.   MRN: [id number]  HPI Patient's here for return to work evaluation secondary to diagnosis of cellulitis of the right lower leg.  Patient was released the [place] starting on 11/30/2023.  State no scheduled follow-up.  No complaints at this time.   Review of Systems A-fib, GERD, hypertension, and sleep apnea.    Objective:   Physical Exam BP 110/72  Pulse Rate 90  Temp 97.8 F (36.6 C)  Resp 16  SpO2 96 %  No acute distress.  No obvious erythema or edema to the right lower leg.  Normal gait.  Neurovascular intact.       Assessment & Plan: Return to work evaluation  Patient cleared to return back to work and will follow-up as needed.

## 2023-12-07 NOTE — Progress Notes (Signed)
 Was out of work about 7 weeks with hospitalization at [facility] and post antibiotics for reported cellulitis right lower leg.  No open areas to body reported and observe bilateral lower legs with RLL some edema more than LLL.  Reports full able to ambulate and walking 3 miles daily and completed all required meds.  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  No other complaints voiced.

## 2023-12-08 ENCOUNTER — Ambulatory Visit
Admission: RE | Admit: 2023-12-08 | Discharge: 2023-12-08 | Disposition: A | Discharge status: Home / Self Care | Attending: Ophthalmology | Admitting: Ophthalmology

## 2023-12-08 ENCOUNTER — Other Ambulatory Visit: Payer: Self-pay

## 2023-12-08 ENCOUNTER — Encounter
Admission: RE | Disposition: A | Discharge status: Home / Self Care | Payer: Self-pay | Source: Home / Self Care | Attending: Ophthalmology

## 2023-12-08 ENCOUNTER — Encounter: Payer: Self-pay | Admitting: Ophthalmology

## 2023-12-08 ENCOUNTER — Ambulatory Visit: Payer: Self-pay | Admitting: Anesthesiology

## 2023-12-08 DIAGNOSIS — Z7984 Long term (current) use of oral hypoglycemic drugs: Secondary | ICD-10-CM | POA: Diagnosis not present

## 2023-12-08 DIAGNOSIS — H2511 Age-related nuclear cataract, right eye: Secondary | ICD-10-CM | POA: Insufficient documentation

## 2023-12-08 DIAGNOSIS — E1136 Type 2 diabetes mellitus with diabetic cataract: Secondary | ICD-10-CM | POA: Diagnosis present

## 2023-12-08 DIAGNOSIS — I10 Essential (primary) hypertension: Secondary | ICD-10-CM | POA: Diagnosis not present

## 2023-12-08 DIAGNOSIS — K7581 Nonalcoholic steatohepatitis (NASH): Secondary | ICD-10-CM | POA: Insufficient documentation

## 2023-12-08 DIAGNOSIS — Z87891 Personal history of nicotine dependence: Secondary | ICD-10-CM | POA: Insufficient documentation

## 2023-12-08 DIAGNOSIS — G4733 Obstructive sleep apnea (adult) (pediatric): Secondary | ICD-10-CM | POA: Diagnosis not present

## 2023-12-08 DIAGNOSIS — M1990 Unspecified osteoarthritis, unspecified site: Secondary | ICD-10-CM | POA: Diagnosis not present

## 2023-12-08 HISTORY — DX: Other ill-defined heart diseases: I51.89

## 2023-12-08 HISTORY — PX: CATARACT EXTRACTION W/PHACO: SHX586

## 2023-12-08 HISTORY — DX: Paroxysmal atrial fibrillation: I48.0

## 2023-12-08 LAB — GLUCOSE, CAPILLARY: Glucose-Capillary: 159 mg/dL — ABNORMAL HIGH (ref 70–99)

## 2023-12-08 SURGERY — PHACOEMULSIFICATION, CATARACT, WITH IOL INSERTION
Anesthesia: Monitor Anesthesia Care | Site: Eye | Laterality: Right

## 2023-12-08 MED ORDER — MIDAZOLAM HCL 2 MG/2ML IJ SOLN
INTRAMUSCULAR | Status: AC
  Filled 2023-12-08: qty 2

## 2023-12-08 MED ORDER — TETRACAINE HCL 0.5 % OP SOLN
1.0000 [drp] | OPHTHALMIC | Status: DC | PRN
  Administered 2023-12-08 (×3): 1 [drp] via OPHTHALMIC

## 2023-12-08 MED ORDER — MIDAZOLAM HCL 2 MG/2ML IJ SOLN
INTRAMUSCULAR | Status: DC | PRN
  Administered 2023-12-08 (×4): 1 mg via INTRAVENOUS

## 2023-12-08 MED ORDER — ARMC OPHTHALMIC DILATING DROPS
1.0000 | OPHTHALMIC | Status: DC | PRN
  Administered 2023-12-08 (×3): 1 via OPHTHALMIC

## 2023-12-08 MED ORDER — TETRACAINE HCL 0.5 % OP SOLN
OPHTHALMIC | Status: AC
  Filled 2023-12-08: qty 4

## 2023-12-08 MED ORDER — BRIMONIDINE TARTRATE-TIMOLOL 0.2-0.5 % OP SOLN
OPHTHALMIC | Status: DC | PRN
  Administered 2023-12-08: 1 [drp] via OPHTHALMIC

## 2023-12-08 MED ORDER — MOXIFLOXACIN HCL 0.5 % OP SOLN
OPHTHALMIC | Status: DC | PRN
Start: 2023-12-08 — End: 2023-12-08
  Administered 2023-12-08: .2 mL via OPHTHALMIC

## 2023-12-08 MED ORDER — ARMC OPHTHALMIC DILATING DROPS
OPHTHALMIC | Status: AC
  Filled 2023-12-08: qty 0.5

## 2023-12-08 MED ORDER — LIDOCAINE HCL (PF) 2 % IJ SOLN
INTRAOCULAR | Status: DC | PRN
  Administered 2023-12-08: 1 mL

## 2023-12-08 MED ORDER — FENTANYL CITRATE (PF) 100 MCG/2ML IJ SOLN
INTRAMUSCULAR | Status: DC | PRN
  Administered 2023-12-08 (×2): 50 ug via INTRAVENOUS

## 2023-12-08 MED ORDER — SIGHTPATH DOSE#1 BSS IO SOLN
INTRAOCULAR | Status: DC | PRN
  Administered 2023-12-08: 15 mL

## 2023-12-08 MED ORDER — SIGHTPATH DOSE#1 NA CHONDROIT SULF-NA HYALURON 40-17 MG/ML IO SOLN
INTRAOCULAR | Status: DC | PRN
  Administered 2023-12-08: 1 mL via INTRAOCULAR

## 2023-12-08 MED ORDER — SIGHTPATH DOSE#1 BSS IO SOLN
INTRAOCULAR | Status: DC | PRN
  Administered 2023-12-08: 41 mL via OPHTHALMIC

## 2023-12-08 MED ORDER — FENTANYL CITRATE (PF) 100 MCG/2ML IJ SOLN
INTRAMUSCULAR | Status: AC
  Filled 2023-12-08: qty 2

## 2023-12-08 SURGICAL SUPPLY — 12 items
CATARACT SUITE SIGHTPATH (MISCELLANEOUS) ×1 IMPLANT
CYSTOTOME ANGL RVRS SHRT 25G (CUTTER) ×1 IMPLANT
CYSTOTOME ANGL RVRS SHRT 25GA (CUTTER) ×1 IMPLANT
FEE CATARACT SUITE SIGHTPATH (MISCELLANEOUS) ×1 IMPLANT
GLOVE BIOGEL PI IND STRL 8 (GLOVE) ×1 IMPLANT
GLOVE SURG LX STRL 8.0 MICRO (GLOVE) ×1 IMPLANT
GLOVE SURG PROTEXIS BL SZ6.5 (GLOVE) ×1 IMPLANT
GLOVE SURG SYN 6.5 PF PI BL (GLOVE) ×1 IMPLANT
LENS IOL CLRN TRC 4 15.5 IMPLANT
NDL FILTER BLUNT 18X1 1/2 (NEEDLE) ×1 IMPLANT
NEEDLE FILTER BLUNT 18X1 1/2 (NEEDLE) ×1 IMPLANT
SYR 3ML LL SCALE MARK (SYRINGE) ×1 IMPLANT

## 2023-12-08 NOTE — Op Note (Signed)
 PREOPERATIVE DIAGNOSIS:  Nuclear sclerotic cataract of the right eye.   POSTOPERATIVE DIAGNOSIS:  Nuclear sclerotic cataract of the right eye.   OPERATIVE PROCEDURE: Procedure(s): PHACOEMULSIFICATION, CATARACT, WITH IOL INSERTION   SURGEON:  Clair Crews, MD.   ANESTHESIA: 1.      Managed anesthesia care. 2.     0.92ml of Shugarcaine was instilled following the paracentesis  Anesthesiologist: Emilie Harden, MD CRNA: Jahoo, Sonia, CRNA  COMPLICATIONS:  None.   TECHNIQUE:   Stop and chop    DESCRIPTION OF PROCEDURE:  The patient was examined and consented in the preoperative holding area where the aforementioned topical anesthesia was applied to the right eye.  The patient was brought back to the Operating Room where he was sat upright on the gurney and given a target to fixate upon while the eye was marked at the 3:00 and 9:00 position.  The patient was then reclined on the operating table.  The eye was prepped and draped in the usual sterile ophthalmic fashion and a lid speculum was placed. A paracentesis was created with the side port blade and the anterior chamber was filled with viscoelastic. A near clear corneal incision was performed with the steel keratome. A continuous curvilinear capsulorrhexis was performed with a cystotome followed by the capsulorrhexis forceps. Hydrodissection and hydrodelineation were carried out with BSS on a blunt cannula. The lens was removed in a stop and chop technique and the remaining cortical material was removed with the irrigation-aspiration handpiece. The eye was inflated with viscoelastic and the CNWOT lens  was placed in the eye and rotated to within a few degrees of the predetermined orientation.  The remaining viscoelastic was removed from the eye.  The Sinskey hook was used to rotate the toric lens into its final resting place at 073 degrees.  0. The eye was inflated to a physiologic pressure and found to be watertight. 0.1ml of Vigamox was placed  in the anterior chamber.  The eye was dressed with Combigan] . The patient was given protective glasses to wear throughout the day and a shield with which to sleep tonight. The patient was also given drops with which to begin a drop regimen today and will follow-up with me in one day. Implant Name Type Inv. Item Serial No. Manufacturer Lot No. LRB No. Used Action  LENS IOL CLRN TRC 4 15.5 - [id number]  LENS IOL CLRN TRC 4 15.5 [id number] [employer]  Right 1 Implanted   Procedure(s) with comments: PHACOEMULSIFICATION, CATARACT, WITH IOL INSERTION (Right) - 6.73 0:45.4  Electronically signed: Clair Crews 12/08/2023 9:58 AM

## 2023-12-08 NOTE — Anesthesia Postprocedure Evaluation (Signed)
 Anesthesia Post Note  Patient: Jeremy West  Procedure(s) Performed: PHACOEMULSIFICATION, CATARACT, WITH IOL INSERTION (Right: Eye)  Patient location during evaluation: PACU Anesthesia Type: MAC Level of consciousness: awake and alert Pain management: pain level controlled Vital Signs Assessment: post-procedure vital signs reviewed and stable Respiratory status: spontaneous breathing, nonlabored ventilation, respiratory function stable and patient connected to nasal cannula oxygen Cardiovascular status: stable and blood pressure returned to baseline Postop Assessment: no apparent nausea or vomiting Anesthetic complications: no   No notable events documented.   Last Vitals:  Vitals:   12/08/23 1000 12/08/23 1006  BP: (!) 147/83 129/89  Pulse: 98 91  Resp: 18 17  Temp: (!) 36.4 C (!) 36.3 C  SpO2: 95% 97%    Last Pain:  Vitals:   12/08/23 1006  TempSrc:   PainSc: 0-No pain                 Schyler Butikofer C Misk Galentine

## 2023-12-08 NOTE — Transfer of Care (Signed)
 Immediate Anesthesia Transfer of Care Note  Patient: Jeremy West  Procedure(s) Performed: PHACOEMULSIFICATION, CATARACT, WITH IOL INSERTION (Right: Eye)  Patient Location: PACU  Anesthesia Type: MAC  Level of Consciousness: awake, alert  and patient cooperative  Airway and Oxygen Therapy: Patient Spontanous Breathing and Patient connected to supplemental oxygen  Post-op Assessment: Post-op Vital signs reviewed, Patient's Cardiovascular Status Stable, Respiratory Function Stable, Patent Airway and No signs of Nausea or vomiting  Post-op Vital Signs: Reviewed and stable  Complications: No notable events documented.
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
## 2023-12-16 DIAGNOSIS — I498 Other specified cardiac arrhythmias: Secondary | ICD-10-CM | POA: Diagnosis not present

## 2023-12-16 DIAGNOSIS — I471 Supraventricular tachycardia, unspecified: Secondary | ICD-10-CM | POA: Diagnosis not present

## 2023-12-16 DIAGNOSIS — I472 Ventricular tachycardia, unspecified: Secondary | ICD-10-CM | POA: Diagnosis not present

## 2023-12-22 ENCOUNTER — Encounter: Payer: Self-pay | Admitting: Physician Assistant

## 2023-12-22 ENCOUNTER — Ambulatory Visit: Payer: Self-pay | Admitting: Physician Assistant

## 2023-12-22 VITALS — BP 131/80 | HR 80 | Temp 97.7°F | Resp 14

## 2023-12-22 DIAGNOSIS — F419 Anxiety disorder, unspecified: Secondary | ICD-10-CM | POA: Insufficient documentation

## 2023-12-22 DIAGNOSIS — L03119 Cellulitis of unspecified part of limb: Secondary | ICD-10-CM | POA: Insufficient documentation

## 2023-12-22 DIAGNOSIS — D690 Allergic purpura: Secondary | ICD-10-CM

## 2023-12-22 DIAGNOSIS — E878 Other disorders of electrolyte and fluid balance, not elsewhere classified: Secondary | ICD-10-CM | POA: Insufficient documentation

## 2023-12-22 DIAGNOSIS — D649 Anemia, unspecified: Secondary | ICD-10-CM | POA: Insufficient documentation

## 2023-12-22 NOTE — Addendum Note (Signed)
 Addended by: Eller Gut, Akaash Vandewater M on: 12/22/2023 04:00 PM   Modules accepted: Orders

## 2023-12-22 NOTE — Progress Notes (Signed)
 Less than  3 months has had red areas on top of hands & up forearms - flat. States sometimes they itch. Has one on left hand that he said he scratched it & it started bleeding. States they have been worse since he had cellulitis in the right leg & hospitalization.  Has tried putting lotion on them when they itched.  With new meds he's on he's unsure what he can or cannot take & didn't use any OTC medications (oral or topical).

## 2023-12-22 NOTE — Progress Notes (Signed)
   Subjective: Skin rash    Patient ID: Jeremy West, male    DOB: 1966-01-05, 58 y.o.   MRN: [id number]  HPI Patient presents with purple macular lesions on the dorsal aspects of bilateral arms.  Onset of complaint approximately 3 months ago.  Denies any provocative activities for lesions.  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  Review of  labs were negative for platelet disorder.  Current medication consists of Jardiance, Plaquenil , metoprolol , potassium chloride, and intermittently uses of Tylenol .   Review of Systems Impaired fasting glucose, GERD, A-fib, obesity, and rheumatoid arthritis.    Objective:   Physical Exam  12/22/2023   1:57 PM    BP 131/80  BP Location Left Arm  Patient Position Sitting  Cuff Size Large  Pulse Rate 80  Temp 97.7 F (36.5 C)  Temp Source Temporal  Resp 14  SpO2 98 %  Examination of skin lesions shows bilateral forearm purple macular lesions mostly on the volar aspect of the upper extremities.  All of the areas of the body are spared.       Assessment & Plan: Actinic Purpura  Consult to dermatology.  Advised to wear long sleeves during prolonged sun exposure

## 2023-12-24 DIAGNOSIS — M1712 Unilateral primary osteoarthritis, left knee: Secondary | ICD-10-CM | POA: Diagnosis not present

## 2024-01-06 DIAGNOSIS — M1712 Unilateral primary osteoarthritis, left knee: Secondary | ICD-10-CM | POA: Diagnosis not present

## 2024-01-11 ENCOUNTER — Encounter: Payer: Self-pay | Admitting: Ophthalmology

## 2024-01-12 DIAGNOSIS — M1712 Unilateral primary osteoarthritis, left knee: Secondary | ICD-10-CM | POA: Diagnosis not present

## 2024-01-12 NOTE — Progress Notes (Unsigned)
 Electrophysiology Office Note:    Date:  01/13/2024   ID:  TAESEAN RETH, DOB [birth year], MRN [id number]  [facility] Cardiologist:  Evalene Lunger, MD  [facility] Electrophysiologist:  OLE ONEIDA HOLTS, MD   Referring MD: Claudene Tanda POUR, PA-C   Chief Complaint: Ventricular tachycardia  History of Present Illness:    [name] is a 58 year old man who I am seeing today for an evaluation of ventricular tachycardia.  He gets his health care at the [place].  Was previously seen by [facility] regional cardiology.  Has a history of rheumatoid arthritis, sleep apnea, atrial fibrillation, hypertension and a dilated aortic root.  He was most recently seen in our clinic in 2022.  Limited outside records from June of this year show a referral for consideration of ablation versus antiarrhythmic drugs.  He takes metoprolol  succinate 50 mg by mouth once daily, Plaquenil .  The request includes a heart monitor result.  Heart monitor showed 1.01% PVC burden.  There were 127 episodes of SVT, longest 14 seconds.  There were 46 episodes of VT, longest 24 seconds with an average rate of 105 bpm.    Today he tells me that all of his problems started when he was hospitalized for cellulitis.  The cellulitis started after he had a change in his rheumatoid arthritis medications.  He tells me that he can sometimes feel his heart skipping and jumping but this is not a daily occurrence.     Their past medical, social and family history was reviewed.   ROS:   Please see the history of present illness.    All other systems reviewed and are negative.  EKGs/Labs/Other Studies Reviewed:    The following studies were reviewed today:  ZIO monitor personally reviewed.  Episodes labeled VT may be consistent with aberrantly conducted atrial arrhythmias given some irregularity and variable QRS widening.    EKG Interpretation Date/Time:  Wednesday January 13 2024 07:55:41 EDT Ventricular Rate:  77 PR  Interval:  158 QRS Duration:  96 QT Interval:  392 QTC Calculation: 443 R Axis:   -18  Text Interpretation: Normal sinus rhythm Normal ECG Confirmed by HOLTS OLE [phone] on 01/13/2024 7:59:30 AM    Physical Exam:    VS:  BP (!) 146/92   Pulse 77   Ht 6' (1.829 m)   Wt (!) 307 lb (139.3 kg)   SpO2 96%   BMI 41.64 kg/m     Wt Readings from Last 3 Encounters:  01/13/24 (!) 307 lb (139.3 kg)  12/08/23 298 lb (135.2 kg)  11/14/23 (!) 316 lb (143.3 kg)     GEN: no distress CARD: RRR, No MRG RESP: No IWOB. CTAB.        ASSESSMENT AND PLAN:    1. Paroxysmal atrial fibrillation (HCC)      #Atrial fibrillation #PVCs/NSVT Paroxysmal CHA2DS2-VASc of 1 for hypertension Some of his atrial fibrillation/atrial tachycardia is aberrantly conducted.  I suspect much of his abnormal heart monitor findings are atrial arrhythmias with variant conduction but I cannot completely exclude ventricular arrhythmias.  I do not have any evidence that he has had an ischemic eval in the past.  I would like to order a PET stress test to further evaluate for significant coronary artery disease.  If no significant coronary artery disease is found, favor starting him on flecainide to control his atrial fibrillation.  After starting flecainide would repeat a heart monitor to reassess the arrhythmia burden.  Will increase his metoprolol  succinate to 50  mg by mouth twice daily  Follow-up with me in 6 to 8 weeks after his PET stress    Signed, Ole T. Cindie, MD, [facility], [facility] 01/13/2024 8:19 AM    Electrophysiology [facility]

## 2024-01-13 ENCOUNTER — Encounter: Payer: Self-pay | Admitting: Cardiology

## 2024-01-13 ENCOUNTER — Ambulatory Visit: Attending: Cardiology | Admitting: Cardiology

## 2024-01-13 VITALS — BP 146/92 | HR 77 | Ht 72.0 in | Wt 307.0 lb

## 2024-01-13 DIAGNOSIS — I480 Paroxysmal atrial fibrillation: Secondary | ICD-10-CM | POA: Diagnosis not present

## 2024-01-13 MED ORDER — METOPROLOL SUCCINATE ER 50 MG PO TB24: 50.0000 mg | ORAL_TABLET | Freq: Two times a day (BID) | ORAL | 3 refills | Status: AC

## 2024-01-13 NOTE — Patient Instructions (Signed)
 Medication Instructions:  Your physician has recommended you make the following change in your medication:  1) INCREASE Toprol  XL (metoprolol  succinate) to 50 mg twice daily  *If you need a refill on your cardiac medications before your next appointment, please call your pharmacy*  Testing/Procedures: PET Stress CT - see below for instructions. You will be called to schedule this test.  Follow-Up: At [facility], you and your health needs are our priority.  As part of our continuing mission to provide you with exceptional heart care, our providers are all part of one team.  This team includes your primary Cardiologist (physician) and Advanced Practice Providers or APPs (Physician Assistants and Nurse Practitioners) who all work together to provide you with the care you need, when you need it.  Your next appointment:   8 weeks  Provider:   Ole Holts, MD      Other Instructions    Please report to Radiology at the [facility] Main Entrance 30 minutes early for your test.  [address] [place], [place] [address]                         OR   Please report to Radiology at [facility] Main Entrance, medical mall, 30 mins prior to your test.  [address]  [place], [place]  How to Prepare for Your Cardiac PET/CT Stress Test:  Nothing to eat or drink, except water, 3 hours prior to arrival time.  NO caffeine/decaffeinated products, or chocolate 12 hours prior to arrival. (Please note decaffeinated beverages (teas/coffees) still contain caffeine).  If you have caffeine within 12 hours prior, the test will need to be rescheduled.  Medication instructions: Do not take erectile dysfunction medications for 72 hours prior to test (sildenafil, tadalafil) Do not take nitrates (isosorbide mononitrate, Ranexa) the day before or day of test Do not take tamsulosin the day before or morning of test Hold theophylline containing medications  for 12 hours. Hold Dipyridamole 48 hours prior to the test.  Diabetic Preparation: If able to eat breakfast prior to 3 hour fasting, you may take all medications, including your insulin. Do not worry if you miss your breakfast dose of insulin - start at your next meal. If you do not eat prior to 3 hour fast-Hold all diabetes (oral and insulin) medications. Patients who wear a continuous glucose monitor MUST remove the device prior to scanning.  You may take your remaining medications with water.  NO perfume, cologne or lotion on chest or abdomen area. FEMALES - Please avoid wearing dresses to this appointment.  Total time is 1 to 2 hours; you may want to bring reading material for the waiting time.  IF YOU THINK YOU MAY BE PREGNANT, OR ARE NURSING PLEASE INFORM THE TECHNOLOGIST.  In preparation for your appointment, medication and supplies will be purchased.  Appointment availability is limited, so if you need to cancel or reschedule, please call the Radiology Department Scheduler at [phone] 24 hours in advance to avoid a cancellation fee of $100.00  What to Expect When you Arrive:  Once you arrive and check in for your appointment, you will be taken to a preparation room within the Radiology Department.  A technologist or Nurse will obtain your medical history, verify that you are correctly prepped for the exam, and explain the procedure.  Afterwards, an IV will be started in your arm and electrodes will be placed on your  skin for EKG monitoring during the stress portion of the exam. Then you will be escorted to the PET/CT scanner.  There, staff will get you positioned on the scanner and obtain a blood pressure and EKG.  During the exam, you will continue to be connected to the EKG and blood pressure machines.  A small, safe amount of a radioactive tracer will be injected in your IV to obtain a series of pictures of your heart along with an injection of a stress agent.    After your  Exam:  It is recommended that you eat a meal and drink a caffeinated beverage to counter act any effects of the stress agent.  Drink plenty of fluids for the remainder of the day and urinate frequently for the first couple of hours after the exam.  Your doctor will inform you of your test results within 7-10 business days.  For more information and frequently asked questions, please visit our website: [email or web address]  For questions about your test or how to prepare for your test, please call: Cardiac Imaging Nurse Navigators Office: [phone]

## 2024-01-14 DIAGNOSIS — L989 Disorder of the skin and subcutaneous tissue, unspecified: Secondary | ICD-10-CM | POA: Diagnosis not present

## 2024-01-15 NOTE — Discharge Instructions (Signed)

## 2024-01-18 DIAGNOSIS — M1712 Unilateral primary osteoarthritis, left knee: Secondary | ICD-10-CM | POA: Diagnosis not present

## 2024-01-19 ENCOUNTER — Ambulatory Visit
Admission: RE | Admit: 2024-01-19 | Discharge: 2024-01-19 | Disposition: A | Discharge status: Home / Self Care | Attending: Ophthalmology | Admitting: Ophthalmology

## 2024-01-19 ENCOUNTER — Encounter: Payer: Self-pay | Admitting: Ophthalmology

## 2024-01-19 ENCOUNTER — Other Ambulatory Visit: Payer: Self-pay

## 2024-01-19 ENCOUNTER — Ambulatory Visit: Payer: Self-pay | Admitting: Anesthesiology

## 2024-01-19 ENCOUNTER — Encounter
Admission: RE | Disposition: A | Discharge status: Home / Self Care | Payer: Self-pay | Source: Home / Self Care | Attending: Ophthalmology

## 2024-01-19 DIAGNOSIS — E1136 Type 2 diabetes mellitus with diabetic cataract: Secondary | ICD-10-CM | POA: Insufficient documentation

## 2024-01-19 DIAGNOSIS — I10 Essential (primary) hypertension: Secondary | ICD-10-CM | POA: Insufficient documentation

## 2024-01-19 DIAGNOSIS — G4733 Obstructive sleep apnea (adult) (pediatric): Secondary | ICD-10-CM | POA: Insufficient documentation

## 2024-01-19 DIAGNOSIS — H2512 Age-related nuclear cataract, left eye: Secondary | ICD-10-CM | POA: Insufficient documentation

## 2024-01-19 DIAGNOSIS — M1990 Unspecified osteoarthritis, unspecified site: Secondary | ICD-10-CM | POA: Insufficient documentation

## 2024-01-19 HISTORY — PX: CATARACT EXTRACTION W/PHACO: SHX586

## 2024-01-19 LAB — GLUCOSE, CAPILLARY: Glucose-Capillary: 185 mg/dL — ABNORMAL HIGH (ref 70–99)

## 2024-01-19 SURGERY — PHACOEMULSIFICATION, CATARACT, WITH IOL INSERTION
Anesthesia: Monitor Anesthesia Care | Site: Eye | Laterality: Left

## 2024-01-19 MED ORDER — SIGHTPATH DOSE#1 BSS IO SOLN
INTRAOCULAR | Status: DC | PRN
  Administered 2024-01-19: 15 mL

## 2024-01-19 MED ORDER — LACTATED RINGERS IV SOLN: INTRAVENOUS | Status: DC

## 2024-01-19 MED ORDER — MOXIFLOXACIN HCL 0.5 % OP SOLN
OPHTHALMIC | Status: DC | PRN
  Administered 2024-01-19: .2 mL via OPHTHALMIC

## 2024-01-19 MED ORDER — BRIMONIDINE TARTRATE-TIMOLOL 0.2-0.5 % OP SOLN
OPHTHALMIC | Status: DC | PRN
  Administered 2024-01-19: 1 [drp] via OPHTHALMIC

## 2024-01-19 MED ORDER — MIDAZOLAM HCL 2 MG/2ML IJ SOLN
INTRAMUSCULAR | Status: AC
  Filled 2024-01-19: qty 2

## 2024-01-19 MED ORDER — TETRACAINE HCL 0.5 % OP SOLN
1.0000 [drp] | OPHTHALMIC | Status: DC | PRN
  Administered 2024-01-19 (×3): 1 [drp] via OPHTHALMIC

## 2024-01-19 MED ORDER — SIGHTPATH DOSE#1 BSS IO SOLN
INTRAOCULAR | Status: DC | PRN
  Administered 2024-01-19: 48 mL via OPHTHALMIC

## 2024-01-19 MED ORDER — LIDOCAINE HCL (PF) 2 % IJ SOLN
INTRAOCULAR | Status: DC | PRN
  Administered 2024-01-19: 1 mL via INTRAMUSCULAR

## 2024-01-19 MED ORDER — SIGHTPATH DOSE#1 NA CHONDROIT SULF-NA HYALURON 40-17 MG/ML IO SOLN
INTRAOCULAR | Status: DC | PRN
  Administered 2024-01-19: 1 mL via INTRAOCULAR

## 2024-01-19 MED ORDER — FENTANYL CITRATE (PF) 100 MCG/2ML IJ SOLN
INTRAMUSCULAR | Status: DC | PRN
  Administered 2024-01-19: 50 ug via INTRAVENOUS

## 2024-01-19 MED ORDER — MIDAZOLAM HCL 5 MG/5ML IJ SOLN
INTRAMUSCULAR | Status: DC | PRN
  Administered 2024-01-19: 2 mg via INTRAVENOUS

## 2024-01-19 MED ORDER — FENTANYL CITRATE (PF) 100 MCG/2ML IJ SOLN
INTRAMUSCULAR | Status: AC
Start: 2024-01-19 — End: 2024-01-19
  Filled 2024-01-19: qty 2

## 2024-01-19 MED ORDER — ARMC OPHTHALMIC DILATING DROPS
1.0000 | OPHTHALMIC | Status: DC | PRN
  Administered 2024-01-19 (×3): 1 via OPHTHALMIC

## 2024-01-19 MED ORDER — TETRACAINE HCL 0.5 % OP SOLN
OPHTHALMIC | Status: AC
  Filled 2024-01-19: qty 4

## 2024-01-19 MED ORDER — ARMC OPHTHALMIC DILATING DROPS
OPHTHALMIC | Status: AC
  Filled 2024-01-19: qty 0.5

## 2024-01-19 SURGICAL SUPPLY — 11 items
CATARACT SUITE SIGHTPATH (MISCELLANEOUS) ×1 IMPLANT
CYSTOTOME ANGL RVRS SHRT 25G (CUTTER) ×1 IMPLANT
CYSTOTOME ANGL RVRS SHRT 25GA (CUTTER) ×1 IMPLANT
FEE CATARACT SUITE SIGHTPATH (MISCELLANEOUS) ×1 IMPLANT
GLOVE BIOGEL PI IND STRL 8 (GLOVE) ×1 IMPLANT
GLOVE SURG LX STRL 8.0 MICRO (GLOVE) ×1 IMPLANT
GLOVE SURG PROTEXIS BL SZ6.5 (GLOVE) ×1 IMPLANT
GLOVE SURG SYN 6.5 PF PI BL (GLOVE) ×1 IMPLANT
LENS IOL CLRN WGN WHL 15.5 (Intraocular Lens) IMPLANT
NDL FILTER BLUNT 18X1 1/2 (NEEDLE) ×1 IMPLANT
NEEDLE FILTER BLUNT 18X1 1/2 (NEEDLE) ×1 IMPLANT

## 2024-01-19 NOTE — Op Note (Signed)
 PREOPERATIVE DIAGNOSIS:  Nuclear sclerotic cataract of the left eye.   POSTOPERATIVE DIAGNOSIS:  Nuclear sclerotic cataract of the left eye.   OPERATIVE PROCEDURE:ORPROCALL@   SURGEON:  Elsie Carmine, MD.   ANESTHESIA:  Anesthesiologist: Ola Donny BROCKS, MD CRNA: Niki Manus SAUNDERS, CRNA  1.      Managed anesthesia care. 2.     0.50ml of Shugarcaine was instilled following the paracentesis   COMPLICATIONS:  None.   TECHNIQUE:   Stop and chop   DESCRIPTION OF PROCEDURE:  The patient was examined and consented in the preoperative holding area where the aforementioned topical anesthesia was applied to the left eye and then brought back to the Operating Room where the left eye was prepped and draped in the usual sterile ophthalmic fashion and a lid speculum was placed. A paracentesis was created with the side port blade and the anterior chamber was filled with viscoelastic. A near clear corneal incision was performed with the steel keratome. A continuous curvilinear capsulorrhexis was performed with a cystotome followed by the capsulorrhexis forceps. Hydrodissection and hydrodelineation were carried out with BSS on a blunt cannula. The lens was removed in a stop and chop  technique and the remaining cortical material was removed with the irrigation-aspiration handpiece. The capsular bag was inflated with viscoelastic and the Technis [id number] lens was placed in the capsular bag without complication. The remaining viscoelastic was removed from the eye with the irrigation-aspiration handpiece. The wounds were hydrated. The anterior chamber was flushed with BSS and the eye was inflated to physiologic pressure. 0.31ml Vigamox  was placed in the anterior chamber. The wounds were found to be water tight. The eye was dressed with Combigan . The patient was given protective glasses to wear throughout the day and a shield with which to sleep tonight. The patient was also given drops with which to begin a drop regimen  today and will follow-up with me in one day. Implant Name Type Inv. Item Serial No. Manufacturer Lot No. LRB No. Used Action  LENS IOL CLRN WGN WHL 15.5 - [id number] Intraocular Lens LENS IOL CLRN WGN WHL 15.5 [id number] [employer]  Left 1 Implanted    Procedure(s): PHACOEMULSIFICATION, CATARACT, WITH IOL INSERTION  4.49  00:34.7 (Left)  Electronically signed: Elsie Carmine 01/19/2024 10:47 AM

## 2024-01-19 NOTE — Transfer of Care (Signed)
 Immediate Anesthesia Transfer of Care Note  Patient: Jeremy West  Procedure(s) Performed: PHACOEMULSIFICATION, CATARACT, WITH IOL INSERTION  4.49  00:34.7 (Left: Eye)  Patient Location: PACU  Anesthesia Type: MAC  Level of Consciousness: awake, alert  and patient cooperative  Airway and Oxygen Therapy: Patient Spontanous Breathing and Patient connected to supplemental oxygen  Post-op Assessment: Post-op Vital signs reviewed, Patient's Cardiovascular Status Stable, Respiratory Function Stable, Patent Airway and No signs of Nausea or vomiting  Post-op Vital Signs: Reviewed and stable  Complications: No notable events documented.

## 2024-01-19 NOTE — Anesthesia Preprocedure Evaluation (Addendum)
 Anesthesia Evaluation  Patient identified by MRN, date of birth, ID band Patient awake    Reviewed: Allergy & Precautions, H&P , NPO status , Patient's Chart, lab work & pertinent test results  Airway Mallampati: II  TM Distance: >3 FB Neck ROM: Full    Dental no notable dental hx.    Pulmonary neg pulmonary ROS, shortness of breath, sleep apnea    Pulmonary exam normal breath sounds clear to auscultation       Cardiovascular hypertension, Normal cardiovascular exam Rhythm:Regular Rate:Normal  11-09-20 echo FINDINGS   Left Ventricle: Left ventricular ejection fraction, by estimation, is 60  to 65%. The left ventricle has normal function. The left ventricle has no  regional wall motion abnormalities. Definity  contrast agent was given IV  to delineate the left ventricular   endocardial borders. The left ventricular internal cavity size was normal  in size. There is no left ventricular hypertrophy. Left ventricular  diastolic parameters are consistent with Grade I diastolic dysfunction  (impaired relaxation).    Right Ventricle: The right ventricular size is normal. No increase in  right ventricular wall thickness. Right ventricular systolic function is  normal. Tricuspid regurgitation signal is inadequate for assessing PA  pressure.     Neuro/Psych  PSYCHIATRIC DISORDERS Anxiety Depression    negative neurological ROS  negative psych ROS   GI/Hepatic negative GI ROS, Neg liver ROS,GERD  ,,(+) Hepatitis -  Endo/Other  diabetes    Renal/GU negative Renal ROS  negative genitourinary   Musculoskeletal negative musculoskeletal ROS (+) Arthritis ,    Abdominal   Peds negative pediatric ROS (+)  Hematology negative hematology ROS (+) Blood dyscrasia, anemia   Anesthesia Other Findings Medical History  Hypertension PAF (paroxysmal atrial fibrillation) (HCC) GERD (gastroesophageal reflux disease) Morbid obesity  (HCC) History of echocardiogram Dilated aortic root (HCC) Rheumatoid arthritis (HCC) Tubular adenoma of colon Villous adenoma of colon OA (osteoarthritis) Seropositive rheumatoid arthritis (HCC) Acute cholecystitis Depression NASH (nonalcoholic steatohepatitis) Hyperglycemia Abnormal transaminases OSA on CPAP History of kidney stones DM (diabetes mellitus), type 2 (HCC) Paroxysmal atrial fibrillation (HCC) Grade I diastolic dysfunction   Previous cataract surgery 12-08-23 Dr. Ola   Reproductive/Obstetrics negative OB ROS                             Anesthesia Physical Anesthesia Plan  ASA: 3  Anesthesia Plan: MAC   Post-op Pain Management:    Induction: Intravenous  PONV Risk Score and Plan:   Airway Management Planned: Natural Airway and Nasal Cannula  Additional Equipment:   Intra-op Plan:   Post-operative Plan:   Informed Consent: I have reviewed the patients History and Physical, chart, labs and discussed the procedure including the risks, benefits and alternatives for the proposed anesthesia with the patient or authorized representative who has indicated his/her understanding and acceptance.     Dental Advisory Given  Plan Discussed with: Anesthesiologist, CRNA and Surgeon  Anesthesia Plan Comments: (Patient consented for risks of anesthesia including but not limited to:  - adverse reactions to medications - damage to eyes, teeth, lips or other oral mucosa - nerve damage due to positioning  - sore throat or hoarseness - Damage to heart, brain, nerves, lungs, other parts of body or loss of life  Patient voiced understanding and assent.)       Anesthesia Quick Evaluation
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
## 2024-01-19 NOTE — Anesthesia Postprocedure Evaluation (Signed)
 Anesthesia Post Note  Patient: Jeremy West  Procedure(s) Performed: PHACOEMULSIFICATION, CATARACT, WITH IOL INSERTION  4.49  00:34.7 (Left: Eye)  Patient location during evaluation: PACU Anesthesia Type: MAC Level of consciousness: awake and alert Pain management: pain level controlled Vital Signs Assessment: post-procedure vital signs reviewed and stable Respiratory status: spontaneous breathing, nonlabored ventilation, respiratory function stable and patient connected to nasal cannula oxygen Cardiovascular status: stable and blood pressure returned to baseline Postop Assessment: no apparent nausea or vomiting Anesthetic complications: no   No notable events documented.   Last Vitals:  Vitals:   01/19/24 1048 01/19/24 1052  BP: (!) 144/101 (!) 165/98  Pulse: 98 94  Resp: (!) 23 12  Temp: 36.6 C 36.6 C  SpO2: 95% 96%    Last Pain:  Vitals:   01/19/24 1052  TempSrc:   PainSc: 0-No pain                 Jeremy West

## 2024-02-01 DIAGNOSIS — M1711 Unilateral primary osteoarthritis, right knee: Secondary | ICD-10-CM | POA: Diagnosis not present

## 2024-02-08 DIAGNOSIS — M1711 Unilateral primary osteoarthritis, right knee: Secondary | ICD-10-CM | POA: Diagnosis not present

## 2024-02-12 ENCOUNTER — Encounter (HOSPITAL_COMMUNITY): Payer: Self-pay

## 2024-02-15 ENCOUNTER — Telehealth: Payer: Self-pay | Admitting: Cardiology

## 2024-02-15 DIAGNOSIS — M1711 Unilateral primary osteoarthritis, right knee: Secondary | ICD-10-CM | POA: Diagnosis not present

## 2024-02-15 NOTE — Telephone Encounter (Signed)
 Called and spoke with the patient.  Patient is concerned about not being able to do the PET CT scan due to his claustrophobia.  Patient wanted to know what the machine looks like and how wide it is. Advised the patient to pull up image search results for CT Scan on his phone and determine if he would be able to do the test. After looking at the results on his phone, patient states that he is not sure, he is glad that it is not like an MRI machine, but he's still not sure.  Also wanted to know that if Dr. Cindie prescribes something prior to the test, for the testing procedure, would that medication affect his ability to drive himself after the test.  Advised the patient that some medications that are prescribed for relaxing prior to procedures would inhibit him from being able to drive afterwards.  Advised the patient that I would forward his concerns to Dr Cindie and await his recommendation.  Patient agreeable to that.

## 2024-02-15 NOTE — Telephone Encounter (Signed)
 Patient called wanting to know if he can be given something to help him relax for his upcoming PET CT this coming Thursday, 7/31.

## 2024-02-16 NOTE — Telephone Encounter (Signed)
 Called and left voicemail (Per DPR) relaying Dr. Hiram recommendation of proceeding without medication. Left phone number for patient to call back in case of questions or concerns.

## 2024-02-17 ENCOUNTER — Telehealth: Payer: Self-pay

## 2024-02-17 DIAGNOSIS — R079 Chest pain, unspecified: Secondary | ICD-10-CM

## 2024-02-17 DIAGNOSIS — I480 Paroxysmal atrial fibrillation: Secondary | ICD-10-CM

## 2024-02-17 NOTE — Telephone Encounter (Signed)
 Orders only  Ole T. Cindie, MD, [facility], [facility] Cardiac Electrophysiology

## 2024-02-18 ENCOUNTER — Ambulatory Visit
Admission: RE | Admit: 2024-02-18 | Discharge: 2024-02-18 | Disposition: A | Discharge status: Home / Self Care | Source: Ambulatory Visit | Attending: Cardiology | Admitting: Cardiology

## 2024-02-18 DIAGNOSIS — I480 Paroxysmal atrial fibrillation: Secondary | ICD-10-CM

## 2024-02-18 NOTE — Progress Notes (Signed)
 Pt arrived for his cardiac stress PET/CT but explained that he is very claustrophobic. We allowed the patient to look at the machine and do a trial run where we positioned him on the scanner. Before moving him into place inside the machine he decided to abort and not proceed with the test.   Pt requested something for anxiety/claustrophobia but does not have a designated driver today. He states he would arrange one if he needed one in the future.  Camie Shutter RN Navigator Cardiac Imaging [facility] [phone] Office  [phone] Cell

## 2024-02-25 ENCOUNTER — Telehealth (HOSPITAL_COMMUNITY): Payer: Self-pay | Admitting: Emergency Medicine

## 2024-02-25 MED ORDER — DIAZEPAM 5 MG PO TABS: 2.5000 mg | ORAL_TABLET | Freq: Once | ORAL | 0 refills | Status: AC

## 2024-02-25 NOTE — Telephone Encounter (Signed)
 2.5mg  valium  , 0.5tablet, one dose, no refills - called into [facility] [address] [place] - patient aware to have designated driver   Camie Shutter RN Navigator Cardiac Imaging [facility] [phone] Office  [phone] Cell

## 2024-03-01 ENCOUNTER — Ambulatory Visit
Admission: RE | Admit: 2024-03-01 | Discharge: 2024-03-01 | Disposition: A | Discharge status: Home / Self Care | Source: Ambulatory Visit | Attending: Cardiology | Admitting: Cardiology

## 2024-03-01 DIAGNOSIS — I480 Paroxysmal atrial fibrillation: Secondary | ICD-10-CM | POA: Diagnosis present

## 2024-03-01 LAB — NM PET CT CARDIAC PERFUSION MULTI W/ABSOLUTE BLOODFLOW
LV dias vol: 166 mL (ref 62–150)
MBFR: 2.29
Nuc Rest EF: 42 %
Nuc Stress EF: 48 %
Peak HR: 80 {beats}/min
Rest HR: 88 {beats}/min
Rest MBF: 0.89 ml/g/min
Rest Nuclear Isotope Dose: 23.2 mCi
SRS: 0
SSS: 1
ST Depression (mm): 0 mm
Stress MBF: 2.04 ml/g/min
Stress Nuclear Isotope Dose: 22.5 mCi
TID: 1.11

## 2024-03-01 MED ORDER — RUBIDIUM RB82 GENERATOR (RUBYFILL)
25.0000 | PACK | Freq: Once | INTRAVENOUS | Status: AC
  Administered 2024-03-01 (×2): 22.47 via INTRAVENOUS

## 2024-03-01 MED ORDER — REGADENOSON 0.4 MG/5ML IV SOLN
0.4000 mg | Freq: Once | INTRAVENOUS | Status: AC
  Administered 2024-03-01 (×2): 0.4 mg via INTRAVENOUS
  Filled 2024-03-01: qty 5

## 2024-03-01 MED ORDER — REGADENOSON 0.4 MG/5ML IV SOLN
INTRAVENOUS | Status: AC
  Filled 2024-03-01: qty 5

## 2024-03-01 MED ORDER — RUBIDIUM RB82 GENERATOR (RUBYFILL)
25.0000 | PACK | Freq: Once | INTRAVENOUS | Status: AC
  Administered 2024-03-01 (×2): 23.2 via INTRAVENOUS

## 2024-03-01 NOTE — Progress Notes (Signed)
 Pt tolerated lexiscan . Pt stated only having minor HA at end of scan, no other symptoms. PIV removed and PO caffeine given.

## 2024-03-03 ENCOUNTER — Ambulatory Visit: Payer: Self-pay | Admitting: Cardiology

## 2024-03-09 ENCOUNTER — Ambulatory Visit: Admitting: Cardiology

## 2024-03-14 NOTE — Progress Notes (Unsigned)
  Electrophysiology Office Follow up Visit Note:    Date:  03/16/2024   ID:  TARRIN LEBOW, DOB 1965-12-30, MRN [id number]  PCP:  Claudene Tanda POUR, PA-C  [facility] Cardiologist:  Evalene Lunger, MD  [facility] Electrophysiologist:  OLE ONEIDA HOLTS, MD    Interval History:     Jeremy West is a 58 y.o. male who presents for a follow up visit.   I last saw the patient January 13, 2024.  He has a history of atrial fibrillation, PVCs and NSVT.  I ordered a stress test at the last appointment.  We planned to start flecainide if the stress test was normal.  He is doing well today.  He has no problems with his heart rhythm.  We reviewed his stress test results during today's clinic appointment.        Past medical, surgical, social and family history were reviewed.  ROS:   Please see the history of present illness.    All other systems reviewed and are negative.  EKGs/Labs/Other Studies Reviewed:    The following studies were reviewed today:  March 01, 2024 PET stress No ischemia or infarction Normal LV perfusion EF on stress test slightly low.  Unclear accuracy of this.  Prior echo showed normal EF       Physical Exam:    VS:  BP (!) 118/90 (BP Location: Left Arm, Patient Position: Sitting, Cuff Size: Large)   Pulse 81   Ht 6' (1.829 m)   Wt (!) 307 lb (139.3 kg)   SpO2 97%   BMI 41.64 kg/m     Wt Readings from Last 3 Encounters:  03/16/24 (!) 307 lb (139.3 kg)  01/19/24 (!) 301 lb (136.5 kg)  01/13/24 (!) 307 lb (139.3 kg)     GEN: no distress.  Obese CARD: RRR, No MRG RESP: No IWOB. CTAB.      ASSESSMENT:    1. Paroxysmal atrial fibrillation (HCC)    PLAN:    In order of problems listed above:  #Atrial fibrillation #PVCs/NSVT The patient has frequent ectopy and a history of atrial fibrillation.  No significant coronary artery disease/infarction on recent PET stress.  He reports no problems with his heart rhythm on metoprolol .  We  will hold off on flecainide at this point.  I will repeat an echocardiogram.  He will follow-up in 1 year with an APP.  He will need routine monitoring of his CHA2DS2-VASc.    His CHA2DS2-VASc is currently 0.  Follow-up 1 year with APP  Signed, OLE HOLTS, MD, [facility], [facility] 03/16/2024 9:20 AM    Electrophysiology [facility]

## 2024-03-16 ENCOUNTER — Encounter: Payer: Self-pay | Admitting: Cardiology

## 2024-03-16 ENCOUNTER — Ambulatory Visit: Attending: Cardiology | Admitting: Cardiology

## 2024-03-16 VITALS — BP 118/90 | HR 81 | Ht 72.0 in | Wt 307.0 lb

## 2024-03-16 DIAGNOSIS — I480 Paroxysmal atrial fibrillation: Secondary | ICD-10-CM | POA: Diagnosis not present

## 2024-03-16 NOTE — Patient Instructions (Signed)
 Medication Instructions:  Your physician recommends that you continue on your current medications as directed. Please refer to the Current Medication list given to you today.  *If you need a refill on your cardiac medications before your next appointment, please call your pharmacy*  Testing/Procedures: Echocardiogram  Your physician has requested that you have an echocardiogram. Echocardiography is a painless test that uses sound waves to create images of your heart. It provides your doctor with information about the size and shape of your heart and how well your heart's chambers and valves are working. This procedure takes approximately one hour. There are no restrictions for this procedure. Please do NOT wear cologne, perfume, aftershave, or lotions (deodorant is allowed). Please arrive 15 minutes prior to your appointment time.  Please note: We ask at that you not bring children with you during ultrasound (echo/ vascular) testing. Due to room size and safety concerns, children are not allowed in the ultrasound rooms during exams. Our front office staff cannot provide observation of children in our lobby area while testing is being conducted. An adult accompanying a patient to their appointment will only be allowed in the ultrasound room at the discretion of the ultrasound technician under special circumstances. We apologize for any inconvenience.   Follow-Up: At [facility], you and your health needs are our priority.  As part of our continuing mission to provide you with exceptional heart care, our providers are all part of one team.  This team includes your primary Cardiologist (physician) and Advanced Practice Providers or APPs (Physician Assistants and Nurse Practitioners) who all work together to provide you with the care you need, when you need it.  Your next appointment:   1 year  Provider:   Suzann Riddle, NP

## 2024-03-24 ENCOUNTER — Encounter: Payer: Self-pay | Admitting: Emergency Medicine

## 2024-03-24 ENCOUNTER — Emergency Department

## 2024-03-24 ENCOUNTER — Emergency Department
Admission: EM | Admit: 2024-03-24 | Discharge: 2024-03-24 | Disposition: A | Discharge status: Home / Self Care | Payer: Worker's Compensation | Attending: Emergency Medicine | Admitting: Emergency Medicine

## 2024-03-24 ENCOUNTER — Other Ambulatory Visit: Payer: Self-pay

## 2024-03-24 DIAGNOSIS — E119 Type 2 diabetes mellitus without complications: Secondary | ICD-10-CM | POA: Insufficient documentation

## 2024-03-24 DIAGNOSIS — I10 Essential (primary) hypertension: Secondary | ICD-10-CM | POA: Diagnosis not present

## 2024-03-24 DIAGNOSIS — R079 Chest pain, unspecified: Secondary | ICD-10-CM | POA: Diagnosis present

## 2024-03-24 LAB — CBC WITH DIFFERENTIAL/PLATELET
Abs Immature Granulocytes: 0.03 K/uL (ref 0.00–0.07)
Basophils Absolute: 0 K/uL (ref 0.0–0.1)
Basophils Relative: 0 %
Eosinophils Absolute: 0 K/uL (ref 0.0–0.5)
Eosinophils Relative: 0 %
HCT: 42.1 % (ref 39.0–52.0)
Hemoglobin: 13.8 g/dL (ref 13.0–17.0)
Immature Granulocytes: 0 %
Lymphocytes Relative: 17 %
Lymphs Abs: 1.4 K/uL (ref 0.7–4.0)
MCH: 26.1 pg (ref 26.0–34.0)
MCHC: 32.8 g/dL (ref 30.0–36.0)
MCV: 79.6 fL — ABNORMAL LOW (ref 80.0–100.0)
Monocytes Absolute: 0.6 K/uL (ref 0.1–1.0)
Monocytes Relative: 7 %
Neutro Abs: 6.3 K/uL (ref 1.7–7.7)
Neutrophils Relative %: 76 %
Platelets: 261 K/uL (ref 150–400)
RBC: 5.29 MIL/uL (ref 4.22–5.81)
RDW: 14.1 % (ref 11.5–15.5)
WBC: 8.3 K/uL (ref 4.0–10.5)
nRBC: 0 % (ref 0.0–0.2)

## 2024-03-24 LAB — BASIC METABOLIC PANEL WITH GFR
Anion gap: 10 (ref 5–15)
BUN: 16 mg/dL (ref 6–20)
CO2: 23 mmol/L (ref 22–32)
Calcium: 8.9 mg/dL (ref 8.9–10.3)
Chloride: 105 mmol/L (ref 98–111)
Creatinine, Ser: 1.08 mg/dL (ref 0.61–1.24)
GFR, Estimated: >60 mL/min (ref >60)
Glucose, Bld: 112 mg/dL — ABNORMAL HIGH (ref 70–99)
Potassium: 4 mmol/L (ref 3.5–5.1)
Sodium: 138 mmol/L (ref 135–145)

## 2024-03-24 LAB — TROPONIN I (HIGH SENSITIVITY)
Troponin I (High Sensitivity): 18 ng/L — ABNORMAL HIGH (ref <18)
Troponin I (High Sensitivity): 20 ng/L — ABNORMAL HIGH (ref <18)

## 2024-03-24 MED ORDER — IBUPROFEN 800 MG PO TABS
800.0000 mg | ORAL_TABLET | Freq: Once | ORAL | Status: AC
  Administered 2024-03-24: 800 mg via ORAL
  Filled 2024-03-24: qty 1

## 2024-03-24 MED ORDER — IOHEXOL 300 MG/ML  SOLN
75.0000 mL | Freq: Once | INTRAMUSCULAR | Status: AC | PRN
  Administered 2024-03-24: 75 mL via INTRAVENOUS

## 2024-03-24 NOTE — Discharge Instructions (Addendum)
 Your blood work was normal today. Your EKG and CT of your chest were also normal.   You will likely be more sore tomorrow than you are today, this is normal. After this your pain should slowly be improving. If not, you need to be seen by another health care provider. This could be your PCP, urgent care or in the emergency department. Return to the emergency department specifically, if you have development of chest pain, shortness of breath, abdominal pain, new abdominal bruising or any other symptom personally concerning to you.
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
## 2024-03-24 NOTE — ED Notes (Signed)
 Pt to have workers comp done and employer requesting drug/alcohol screen. [address] of [place] is employer

## 2024-03-28 ENCOUNTER — Encounter: Payer: Self-pay | Admitting: Physician Assistant

## 2024-03-28 ENCOUNTER — Ambulatory Visit: Payer: Self-pay | Admitting: Physician Assistant

## 2024-03-28 MED ORDER — NAPROXEN 500 MG PO TABS: 500.0000 mg | ORAL_TABLET | Freq: Two times a day (BID) | ORAL | 0 refills | Status: AC

## 2024-03-28 MED ORDER — ORPHENADRINE CITRATE ER 100 MG PO TB12: 100.0000 mg | ORAL_TABLET | Freq: Two times a day (BID) | ORAL | 0 refills | Status: DC
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
## 2024-04-05 ENCOUNTER — Other Ambulatory Visit: Payer: Self-pay | Admitting: Physician Assistant

## 2024-04-05 MED ORDER — PREDNISONE 20 MG PO TABS: 20.0000 mg | ORAL_TABLET | Freq: Every day | ORAL | 0 refills | Status: AC

## 2024-04-16 ENCOUNTER — Ambulatory Visit: Admitting: Cardiology

## 2024-04-27 ENCOUNTER — Ambulatory Visit: Attending: Cardiology

## 2024-04-27 DIAGNOSIS — I480 Paroxysmal atrial fibrillation: Secondary | ICD-10-CM | POA: Diagnosis not present

## 2024-04-27 LAB — ECHOCARDIOGRAM COMPLETE
AR max vel: 4.28 cm2
AV Area VTI: 3.81 cm2
AV Area mean vel: 4.25 cm2
AV Mean grad: 3 mmHg
AV Peak grad: 6.2 mmHg
Ao pk vel: 1.24 m/s
Area-P 1/2: 2.95 cm2
S' Lateral: 4.24 cm

## 2024-06-17 ENCOUNTER — Inpatient Hospital Stay
Admission: EM | Admit: 2024-06-17 | Discharge: 2024-06-19 | DRG: 309 | Disposition: A | Discharge status: Home / Self Care | Attending: Family Medicine | Admitting: Family Medicine

## 2024-06-17 ENCOUNTER — Emergency Department

## 2024-06-17 ENCOUNTER — Other Ambulatory Visit: Payer: Self-pay

## 2024-06-17 DIAGNOSIS — I5032 Chronic diastolic (congestive) heart failure: Secondary | ICD-10-CM | POA: Diagnosis present

## 2024-06-17 DIAGNOSIS — Z801 Family history of malignant neoplasm of trachea, bronchus and lung: Secondary | ICD-10-CM

## 2024-06-17 DIAGNOSIS — E66813 Obesity, class 3: Secondary | ICD-10-CM | POA: Diagnosis present

## 2024-06-17 DIAGNOSIS — M059 Rheumatoid arthritis with rheumatoid factor, unspecified: Secondary | ICD-10-CM | POA: Diagnosis present

## 2024-06-17 DIAGNOSIS — Z79620 Long term (current) use of immunosuppressive biologic: Secondary | ICD-10-CM

## 2024-06-17 DIAGNOSIS — Z87891 Personal history of nicotine dependence: Secondary | ICD-10-CM

## 2024-06-17 DIAGNOSIS — Z7984 Long term (current) use of oral hypoglycemic drugs: Secondary | ICD-10-CM

## 2024-06-17 DIAGNOSIS — K7581 Nonalcoholic steatohepatitis (NASH): Secondary | ICD-10-CM | POA: Diagnosis present

## 2024-06-17 DIAGNOSIS — I4819 Other persistent atrial fibrillation: Principal | ICD-10-CM | POA: Diagnosis present

## 2024-06-17 DIAGNOSIS — I4891 Unspecified atrial fibrillation: Principal | ICD-10-CM | POA: Diagnosis present

## 2024-06-17 DIAGNOSIS — E119 Type 2 diabetes mellitus without complications: Secondary | ICD-10-CM | POA: Diagnosis present

## 2024-06-17 DIAGNOSIS — Z79899 Other long term (current) drug therapy: Secondary | ICD-10-CM

## 2024-06-17 DIAGNOSIS — Z7952 Long term (current) use of systemic steroids: Secondary | ICD-10-CM

## 2024-06-17 DIAGNOSIS — G4733 Obstructive sleep apnea (adult) (pediatric): Secondary | ICD-10-CM | POA: Diagnosis present

## 2024-06-17 DIAGNOSIS — I110 Hypertensive heart disease with heart failure: Secondary | ICD-10-CM | POA: Diagnosis present

## 2024-06-17 DIAGNOSIS — Z880 Allergy status to penicillin: Secondary | ICD-10-CM

## 2024-06-17 DIAGNOSIS — Z860101 Personal history of adenomatous and serrated colon polyps: Secondary | ICD-10-CM

## 2024-06-17 DIAGNOSIS — I471 Supraventricular tachycardia, unspecified: Secondary | ICD-10-CM | POA: Diagnosis present

## 2024-06-17 DIAGNOSIS — Z6841 Body Mass Index (BMI) 40.0 and over, adult: Secondary | ICD-10-CM

## 2024-06-17 DIAGNOSIS — I472 Ventricular tachycardia, unspecified: Secondary | ICD-10-CM | POA: Diagnosis present

## 2024-06-17 DIAGNOSIS — Z808 Family history of malignant neoplasm of other organs or systems: Secondary | ICD-10-CM

## 2024-06-17 DIAGNOSIS — Z888 Allergy status to other drugs, medicaments and biological substances status: Secondary | ICD-10-CM

## 2024-06-17 LAB — TROPONIN T, HIGH SENSITIVITY
Troponin T High Sensitivity: 24 ng/L — ABNORMAL HIGH (ref 0–19)
Troponin T High Sensitivity: 22 ng/L — ABNORMAL HIGH (ref 0–19)

## 2024-06-17 LAB — CBC
HCT: 50.9 % (ref 39.0–52.0)
Hemoglobin: 16.3 g/dL (ref 13.0–17.0)
MCH: 25.8 pg — ABNORMAL LOW (ref 26.0–34.0)
MCHC: 32 g/dL (ref 30.0–36.0)
MCV: 80.5 fL (ref 80.0–100.0)
Platelets: 307 K/uL (ref 150–400)
RBC: 6.32 MIL/uL — ABNORMAL HIGH (ref 4.22–5.81)
RDW: 15 % (ref 11.5–15.5)
WBC: 12.3 K/uL — ABNORMAL HIGH (ref 4.0–10.5)
nRBC: 0 % (ref 0.0–0.2)

## 2024-06-17 LAB — BASIC METABOLIC PANEL WITH GFR
Anion gap: 13 (ref 5–15)
BUN: 20 mg/dL (ref 6–20)
CO2: 23 mmol/L (ref 22–32)
Calcium: 9 mg/dL (ref 8.9–10.3)
Chloride: 104 mmol/L (ref 98–111)
Creatinine, Ser: 1.38 mg/dL — ABNORMAL HIGH (ref 0.61–1.24)
GFR, Estimated: 59 mL/min — ABNORMAL LOW (ref >60)
Glucose, Bld: 125 mg/dL — ABNORMAL HIGH (ref 70–99)
Potassium: 4.2 mmol/L (ref 3.5–5.1)
Sodium: 139 mmol/L (ref 135–145)

## 2024-06-17 LAB — GLUCOSE, CAPILLARY: Glucose-Capillary: 113 mg/dL — ABNORMAL HIGH (ref 70–99)

## 2024-06-17 LAB — MAGNESIUM: Magnesium: 2.1 mg/dL (ref 1.7–2.4)

## 2024-06-17 LAB — CBG MONITORING, ED
Glucose-Capillary: 122 mg/dL — ABNORMAL HIGH (ref 70–99)
Glucose-Capillary: 160 mg/dL — ABNORMAL HIGH (ref 70–99)

## 2024-06-17 LAB — TSH: TSH: 0.205 u[IU]/mL — ABNORMAL LOW (ref 0.350–4.500)

## 2024-06-17 MED ORDER — ONDANSETRON HCL 4 MG/2ML IJ SOLN: 4.0000 mg | Freq: Four times a day (QID) | INTRAMUSCULAR | Status: DC | PRN

## 2024-06-17 MED ORDER — INSULIN ASPART 100 UNIT/ML IJ SOLN
0.0000 [IU] | Freq: Three times a day (TID) | INTRAMUSCULAR | Status: DC
  Administered 2024-06-17: 3 [IU] via SUBCUTANEOUS
  Filled 2024-06-17: qty 3

## 2024-06-17 MED ORDER — ACETAMINOPHEN 325 MG PO TABS
650.0000 mg | ORAL_TABLET | Freq: Four times a day (QID) | ORAL | Status: DC | PRN
  Administered 2024-06-18: 650 mg via ORAL
  Filled 2024-06-17: qty 2

## 2024-06-17 MED ORDER — ENOXAPARIN SODIUM 40 MG/0.4ML IJ SOSY
40.0000 mg | PREFILLED_SYRINGE | INTRAMUSCULAR | Status: DC
  Administered 2024-06-17 – 2024-06-18 (×2): 40 mg via SUBCUTANEOUS
  Filled 2024-06-17 (×2): qty 0.4

## 2024-06-17 MED ORDER — METOPROLOL SUCCINATE ER 50 MG PO TB24: 50.0000 mg | ORAL_TABLET | Freq: Every day | ORAL | Status: DC

## 2024-06-17 MED ORDER — DILTIAZEM HCL-DEXTROSE 125-5 MG/125ML-% IV SOLN (PREMIX)
5.0000 mg/h | INTRAVENOUS | Status: DC
  Administered 2024-06-17 – 2024-06-18 (×2): 5 mg/h via INTRAVENOUS
  Filled 2024-06-17 (×3): qty 125

## 2024-06-17 MED ORDER — ACETAMINOPHEN 650 MG RE SUPP: 650.0000 mg | Freq: Four times a day (QID) | RECTAL | Status: DC | PRN

## 2024-06-17 MED ORDER — METOPROLOL SUCCINATE ER 50 MG PO TB24
50.0000 mg | ORAL_TABLET | Freq: Two times a day (BID) | ORAL | Status: DC
  Administered 2024-06-17 – 2024-06-19 (×4): 50 mg via ORAL
  Filled 2024-06-17 (×5): qty 1

## 2024-06-17 MED ORDER — INSULIN ASPART 100 UNIT/ML IJ SOLN: 0.0000 [IU] | Freq: Every day | INTRAMUSCULAR | Status: DC

## 2024-06-17 MED ORDER — FLECAINIDE ACETATE 50 MG PO TABS
50.0000 mg | ORAL_TABLET | Freq: Two times a day (BID) | ORAL | Status: DC
  Administered 2024-06-17 – 2024-06-19 (×5): 50 mg via ORAL
  Filled 2024-06-17 (×7): qty 1

## 2024-06-17 MED ORDER — ONDANSETRON HCL 4 MG PO TABS: 4.0000 mg | ORAL_TABLET | Freq: Four times a day (QID) | ORAL | Status: DC | PRN

## 2024-06-17 MED ORDER — ALBUTEROL SULFATE (2.5 MG/3ML) 0.083% IN NEBU: 2.5000 mg | INHALATION_SOLUTION | RESPIRATORY_TRACT | Status: DC | PRN

## 2024-06-17 MED ORDER — DILTIAZEM HCL 25 MG/5ML IV SOLN
15.0000 mg | Freq: Once | INTRAVENOUS | Status: AC
  Administered 2024-06-17: 15 mg via INTRAVENOUS
  Filled 2024-06-17: qty 5

## 2024-06-17 NOTE — ED Notes (Signed)
 Called CMMD for transfer of central monitoring at this time

## 2024-06-17 NOTE — ED Provider Notes (Signed)
 [facility] Provider Note    Event Date/Time   First MD Initiated Contact with Patient 06/17/24 [phone]     (approximate)   History   Chest Pain   HPI  Jeremy West is a 58 y.o. male with history of diabetes, paroxysmal atrial fibrillation, rheumatoid arthritis who presents with complaints of chest discomfort palpitations.  Found to be in atrial fibrillation.  He did celebrate Thanksgiving yesterday.  No fevers chills or cough     Physical Exam   Triage Vital Signs: ED Triage Vitals  Encounter Vitals Group     BP 06/17/24 0919 (!) 144/127     Girls Systolic BP Percentile --      Girls Diastolic BP Percentile --      Boys Systolic BP Percentile --      Boys Diastolic BP Percentile --      Pulse Rate 06/17/24 0919 (!) 170     Resp 06/17/24 0919 19     Temp 06/17/24 0919 98.1 F (36.7 C)     Temp src --      SpO2 06/17/24 0919 98 %     Weight 06/17/24 0918 63.1 kg (139 lb 3.2 oz)     Height 06/17/24 0918 1.829 m (6')     Head Circumference --      Peak Flow --      Pain Score 06/17/24 0917 4     Pain Loc --      Pain Education --      Exclude from Growth Chart --     Most recent vital signs: Vitals:   06/17/24 1000 06/17/24 1030  BP: 119/82 108/62  Pulse: (!) 40 67  Resp: 12 (!) 21  Temp:    SpO2: 96% 97%     General: Awake, no distress.  CV:  Good peripheral perfusion.  Tachycardia, irregular rhythm Resp:  Normal effort.  Clear to auscultation bilaterally Abd:  No distention.  Other:  No lower extremity edema   ED Results / Procedures / Treatments   Labs (all labs ordered are listed, but only abnormal results are displayed) Labs Reviewed  BASIC METABOLIC PANEL WITH GFR - Abnormal; Notable for the following components:      Result Value   Glucose, Bld 125 (*)    Creatinine, Ser 1.38 (*)    GFR, Estimated 59 (*)    All other components within normal limits  CBC - Abnormal; Notable for the following components:   WBC  12.3 (*)    RBC 6.32 (*)    MCH 25.8 (*)    All other components within normal limits  TROPONIN T, HIGH SENSITIVITY - Abnormal; Notable for the following components:   Troponin T High Sensitivity 24 (*)    All other components within normal limits  TROPONIN T, HIGH SENSITIVITY     EKG  ED ECG REPORT I, Lamar Price, the attending physician, personally viewed and interpreted this ECG.  Date: 06/17/2024  Rhythm: Atrial fibrillation QRS Axis: normal Intervals: Abnormal ST/T Wave abnormalities: normal Narrative Interpretation: RVR    RADIOLOGY Chest x-ray without acute abnormality    PROCEDURES:  Critical Care performed: yes  CRITICAL CARE Performed by: Lamar Price   Total critical care time: 30 minutes  Critical care time was exclusive of separately billable procedures and treating other patients.  Critical care was necessary to treat or prevent imminent or life-threatening deterioration.  Critical care was time spent personally by me on the following activities: development  of treatment plan with patient and/or surrogate as well as nursing, discussions with consultants, evaluation of patient's response to treatment, examination of patient, obtaining history from patient or surrogate, ordering and performing treatments and interventions, ordering and review of laboratory studies, ordering and review of radiographic studies, pulse oximetry and re-evaluation of patient's condition.   Procedures   MEDICATIONS ORDERED IN ED: Medications  diltiazem  (CARDIZEM ) 125 mg in dextrose  5% 125 mL (1 mg/mL) infusion (5 mg/hr Intravenous New Bag/Given 06/17/24 1105)  diltiazem  (CARDIZEM ) injection 15 mg (15 mg Intravenous Given 06/17/24 0941)     IMPRESSION / MDM / ASSESSMENT AND PLAN / ED COURSE  I reviewed the triage vital signs and the nursing notes. Patient's presentation is most consistent with acute presentation with potential threat to life or bodily  function.  Patient presents with chest pain as detailed above, found to be in atrial fibrillation with RVR, this is likely the cause of his chest discomforts.  Does have a history of paroxysmal atrial fibrillation, not on blood thinners.  Possibly related to holiday heart, he reports in the past he is usually come out of atrial fibrillation and has never required cardioversion   Labs pending, will give a dose of IV Cardizem   Lab work demonstrates mild elevation of white blood cell count, mild elevation high sensitive troponin  Improvement in heart rate but still in atrial fibrillation, will start on Cardizem  drip, discussed with the hospitalist for admission      FINAL CLINICAL IMPRESSION(S) / ED DIAGNOSES   Final diagnoses:  Atrial fibrillation with rapid ventricular response (HCC)     Rx / DC Orders   ED Discharge Orders     None        Note:  This document was prepared using Dragon voice recognition software and may include unintentional dictation errors.   Arlander Charleston, MD 06/17/24 1140

## 2024-06-17 NOTE — ED Triage Notes (Signed)
 Pt comes in via pov with complaints of chest pain that started this morning when he woke up. Pt went to [facility] to have his HR checked, and found that he was in AFIB RVR. Pt agreed to drive himself to the ER.Pt with a HR in the 170's in triage. Pt complains of pain 4/10 at this time.
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
## 2024-06-18 DIAGNOSIS — I4891 Unspecified atrial fibrillation: Secondary | ICD-10-CM | POA: Diagnosis not present

## 2024-06-18 LAB — CBC
HCT: 47.2 % (ref 39.0–52.0)
Hemoglobin: 15.6 g/dL (ref 13.0–17.0)
MCH: 25.7 pg — ABNORMAL LOW (ref 26.0–34.0)
MCHC: 33.1 g/dL (ref 30.0–36.0)
MCV: 77.8 fL — ABNORMAL LOW (ref 80.0–100.0)
Platelets: 287 K/uL (ref 150–400)
RBC: 6.07 MIL/uL — ABNORMAL HIGH (ref 4.22–5.81)
RDW: 14.8 % (ref 11.5–15.5)
WBC: 9.4 K/uL (ref 4.0–10.5)
nRBC: 0 % (ref 0.0–0.2)

## 2024-06-18 LAB — GLUCOSE, CAPILLARY
Glucose-Capillary: 99 mg/dL (ref 70–99)
Glucose-Capillary: 108 mg/dL — ABNORMAL HIGH (ref 70–99)
Glucose-Capillary: 104 mg/dL — ABNORMAL HIGH (ref 70–99)
Glucose-Capillary: 154 mg/dL — ABNORMAL HIGH (ref 70–99)

## 2024-06-18 LAB — BASIC METABOLIC PANEL WITH GFR
Anion gap: 11 (ref 5–15)
BUN: 19 mg/dL (ref 6–20)
CO2: 22 mmol/L (ref 22–32)
Calcium: 8.7 mg/dL — ABNORMAL LOW (ref 8.9–10.3)
Chloride: 103 mmol/L (ref 98–111)
Creatinine, Ser: 1.1 mg/dL (ref 0.61–1.24)
GFR, Estimated: >60 mL/min (ref >60)
Glucose, Bld: 104 mg/dL — ABNORMAL HIGH (ref 70–99)
Potassium: 4 mmol/L (ref 3.5–5.1)
Sodium: 136 mmol/L (ref 135–145)

## 2024-06-18 LAB — HIV ANTIBODY (ROUTINE TESTING W REFLEX): HIV Screen 4th Generation wRfx: NONREACTIVE

## 2024-06-18 MED ORDER — PREDNISONE 20 MG PO TABS
20.0000 mg | ORAL_TABLET | Freq: Every day | ORAL | Status: DC
  Administered 2024-06-18 – 2024-06-19 (×2): 20 mg via ORAL
  Filled 2024-06-18 (×2): qty 1

## 2024-06-18 NOTE — Progress Notes (Signed)
  Progress Note  Patient Name: Jeremy West Date of Encounter: 06/18/2024 [facility] Cardiologist: Evalene Lunger, MD   Interval Summary    The patient remains in Afib with rates in the 90s . Patient became very angry as he is feels the plan is unclear and may not want to stay in the hospital if he doesn't undergo cardioversion with 100% certainty on Monday. I told him we are hoping he self-convert to NSR, but we cannot say with certainty he will need cardioversion as he may self-convert to NSR. Patient became extremely rude and aggravated he may stay in the hospital for no reason (does not undergo cardioversion).  Vital Signs Vitals:   06/17/24 1630 06/17/24 1722 06/17/24 2114 06/18/24 0022  BP: 110/76 131/87  108/77  Pulse: 88 86  98  Resp: 17 18 15 16   Temp:      SpO2: 99% 98%  95%  Weight:      Height:        Intake/Output Summary (Last 24 hours) at 06/18/2024 0642 Last data filed at 06/17/2024 1807 Gross per 24 hour  Intake 91.08 ml  Output --  Net 91.08 ml      06/17/2024    9:18 AM 03/24/2024    9:50 AM 03/16/2024    8:54 AM  Last 3 Weights  Weight (lbs) 139 lb 3.2 oz 306 lb 14.1 oz 307 lb  Weight (kg) 63.141 kg 139.2 kg 139.254 kg      Telemetry/ECG  afib  HR80-90s- Personally Reviewed  Physical Exam  GEN: No acute distress.   Neck: No JVD Cardiac: Irreg Irreg, no murmurs, rubs, or gallops.  Respiratory: Clear to auscultation bilaterally. GI: Soft, nontender, non-distended  MS: No edema  Assessment & Plan   Afib with RVR - presented with chest pain and SOB found to have rapid afib with rates up to 170 started on IV Cardizem  - Patient has a history of A-fib not on anticoagulation due to CHA2DS2-VASc of 1(DM2) - Patient also has a history of PSVT and NSVT followed by EP.  Plan was to start flecainide  for symptoms. - Keep mag greater than 2 and K greater than 4 - TSH 0.205 - Echo in October 2025 showed normal pump function, grade 1  diastolic dysfunction - Continue IV Cardizem >wean as able - Continue Toprol  50mg  BID - If patient does not self convert may need to consider TEE/cardioversion with plan for EP follow-up as outpatient.   Diabetes type 2 - Per IM   OSA - CPAP at night    For questions or updates, please contact [facility] Please consult [email or web address] for contact info under         Signed, Oren Barella VEAR Fishman, PA-C

## 2024-06-18 NOTE — Plan of Care (Signed)

## 2024-06-18 NOTE — Progress Notes (Signed)
 PROGRESS NOTE    Jeremy West  FMW:[id number] DOB: [birth year] DOA: 06/17/2024 PCP: Claudene Tanda POUR, PA-C  Chief Complaint  Patient presents with   Chest Pain    Hospital Course:  Jeremy West 58 year old male with non-insulin-dependent type 2 diabetes, hypertension, morbid obesity, OSA on CPAP, PAF not on anticoagulation due to low CHA2DS2-VASc, who was admitted with recurrent A-fib with RVR.  He is followed by Dr. Cindie EP and maintained for suspected ventricular arrhythmia on Toprol -XL.  In the ED he was found to be in rapid A-fib and started on diltiazem .  Cardiology was consulted.  Cardiology has now initiated the patient on Cardizem  drip, flecainide and metoprolol .  They are planning for TEE guided cardioversion on Monday if A-fib is persistent  Subjective: No acute events overnight.  On evaluation today patient denies any chest pain or shortness of breath.  He is requesting to be moved to a regular diet.  Reports the low-salt diet he is currently on is not palatable for him.   Objective: Vitals:   06/17/24 2114 06/18/24 0022 06/18/24 0811 06/18/24 1150  BP:  108/77 107/85 (!) 119/91  Pulse:  98 74 87  Resp: 15 16 18 16   Temp:   97.9 F (36.6 C) 97.6 F (36.4 C)  TempSrc:   Oral   SpO2:  95% 98% 100%  Weight:      Height:        Intake/Output Summary (Last 24 hours) at 06/18/2024 1541 Last data filed at 06/18/2024 1409 Gross per 24 hour  Intake 811.08 ml  Output --  Net 811.08 ml   Filed Weights   06/17/24 0918  Weight: 63.1 kg    Examination: General exam: Appears calm and comfortable, NAD, obese Respiratory system: No work of breathing, symmetric chest wall expansion Cardiovascular system: S1 & S2 heard, irregular rhythm Gastrointestinal system: Abdomen is nondistended, soft and nontender.  Neuro: Alert and oriented. No focal neurological deficits. Extremities: Symmetric, expected ROM Skin: No rashes, lesions Psychiatry: Demonstrates  appropriate judgement and insight. Mood & affect appropriate for situation.   Assessment & Plan:  Principal Problem:   Atrial fibrillation with RVR (HCC)   A-fib with RVR History of PSVT - Rate up to 170 on arrival - Improved on Cardizem  - Cardiology consulted - Cardiology proceeding with flecainide, Cardizem , metoprolol  for now - Maintain electrolytes mag greater than 2 potassium greater than 4 - TSH 0.205, T4 pending - Echocardiogram October 2025 preserved EF, grade 1 diastolic dysfunction - Cardiology tentatively planning for cardioversion with the EP on Monday if A-fib persists - CHA2DS2-VASc: 2 (diabetes and diastolic CHF) cardiology is not recommending anticoagulation at this time  Heart failure with preserved EF - Most recent echocardiogram October 2025: EF 55 to 60%, grade 1 diastolic dysfunction - Clinically euvolemic - Educated on low-salt diet  Type 2 diabetes - Patient reports his diet was too restrictive.  Have placed on regular diet for now so we have better idea of what patient is eating outpatient - Moderate dose sliding scale - Most recent hemoglobin A1c in March 2025: 7.8%, repeat A1c still pending  BMI 41 Obesity Class III - Outpatient follow up for lifestyle modification and risk factor management - On Zepbound outpatient  OSA -- Cont CPAP nightly  Rheumatoid Arthritis -- Cont home dose prednisone  (20mg  daily)  DVT prophylaxis: lovenox    Code Status: Full Code Disposition:  Obs pending clinical resolution  Consultants:  Treatment Team:  Consulting Physician: Darron Deatrice LABOR, MD  Procedures:    Antimicrobials:  Anti-infectives (From admission, onward)    None       Data Reviewed: I have personally reviewed following labs and imaging studies CBC: Recent Labs  Lab 06/17/24 0919 06/18/24 0459  WBC 12.3* 9.4  HGB 16.3 15.6  HCT 50.9 47.2  MCV 80.5 77.8*  PLT 307 287   Basic Metabolic Panel: Recent Labs  Lab 06/17/24 0919  06/17/24 1219 06/18/24 0459  NA 139  --  136  K 4.2  --  4.0  CL 104  --  103  CO2 23  --  22  GLUCOSE 125*  --  104*  BUN 20  --  19  CREATININE 1.38*  --  1.10  CALCIUM  9.0  --  8.7*  MG  --  2.1  --    GFR: Estimated Creatinine Clearance: 65.3 mL/min (by C-G formula based on SCr of 1.1 mg/dL). Liver Function Tests: No results for input(s): AST, ALT, ALKPHOS, BILITOT, PROT, ALBUMIN in the last 168 hours. CBG: Recent Labs  Lab 06/17/24 1216 06/17/24 1611 06/17/24 2327 06/18/24 0815 06/18/24 1151  GLUCAP 122* 160* 113* 99 108*    No results found for this or any previous visit (from the past 240 hours).   Radiology Studies: DG Chest Port 1 View Result Date: 06/17/2024 CLINICAL DATA:  Chest pain upon awakening this morning. Patient developed atrial fibrillation with rapid ventricular response this morning. EXAM: PORTABLE CHEST 1 VIEW COMPARISON:  11/14/2023. FINDINGS: Cardiac silhouette is normal in size and configuration. No mediastinal or hilar masses. Clear lungs.  No pleural effusion or pneumothorax. Skeletal structures are grossly intact. IMPRESSION: No active disease. Electronically Signed   By: Alm Parkins M.D.   On: 06/17/2024 10:07    Scheduled Meds:  enoxaparin  (LOVENOX ) injection  40 mg Subcutaneous Q24H   flecainide  50 mg Oral Q12H   insulin aspart  0-15 Units Subcutaneous TID WC   insulin aspart  0-5 Units Subcutaneous QHS   metoprolol  succinate  50 mg Oral BID   Continuous Infusions:  diltiazem  (CARDIZEM ) infusion 5 mg/hr (06/18/24 1048)     LOS: 0 days  MDM: Patient is high risk for one or more organ failure.  They necessitate ongoing hospitalization for continued IV therapies and subsequent lab monitoring. Total time spent interpreting labs and vitals, reviewing the medical record, coordinating care amongst consultants and care team members, directly assessing and discussing care with the patient and/or family: 55 min  Dorien Mayotte,  DO [facility]  To contact the attending physician between 7A-7P please use Epic Chat. To contact the covering physician during after hours 7P-7A, please review Amion.  06/18/2024, 3:41 PM   *This document has been created with the assistance of dictation software. Please excuse typographical errors. *

## 2024-06-19 DIAGNOSIS — I4891 Unspecified atrial fibrillation: Secondary | ICD-10-CM | POA: Diagnosis not present

## 2024-06-19 LAB — HEMOGLOBIN A1C
Hgb A1c MFr Bld: 7.1 % — ABNORMAL HIGH (ref 4.8–5.6)
Mean Plasma Glucose: 157 mg/dL

## 2024-06-19 LAB — GLUCOSE, CAPILLARY
Glucose-Capillary: 85 mg/dL (ref 70–99)
Glucose-Capillary: 143 mg/dL — ABNORMAL HIGH (ref 70–99)

## 2024-06-19 LAB — T4: T4, Total: 5.6 ug/dL (ref 4.5–12.0)

## 2024-06-19 MED ORDER — FLECAINIDE ACETATE 50 MG PO TABS: 50.0000 mg | ORAL_TABLET | Freq: Two times a day (BID) | ORAL | 0 refills | Status: AC

## 2024-06-19 NOTE — Progress Notes (Signed)
 Reviewed discharge instructions with pt, pt verbalizes understanding. IV removed, Tele removed. Pt has belongings. Pt walking indep.

## 2024-06-19 NOTE — Plan of Care (Signed)
  Problem: Education: Goal: Ability to describe self-care measures that may prevent or decrease complications (Diabetes Survival Skills Education) will improve 06/19/2024 1245 by Akeela Busk K, RN Outcome: Adequate for Discharge 06/19/2024 [phone] by Symphoni Helbling K, RN Outcome: Progressing Goal: Individualized Educational Video(s) Outcome: Adequate for Discharge   Problem: Coping: Goal: Ability to adjust to condition or change in health will improve Outcome: Adequate for Discharge   Problem: Fluid Volume: Goal: Ability to maintain a balanced intake and output will improve 06/19/2024 1245 by Cheridan Kibler K, RN Outcome: Adequate for Discharge 06/19/2024 9061 by Freeda Leon POUR, RN Outcome: Progressing   Problem: Health Behavior/Discharge Planning: Goal: Ability to identify and utilize available resources and services will improve 06/19/2024 1245 by Freeda Leon POUR, RN Outcome: Adequate for Discharge 06/19/2024 9061 by Freeda Leon POUR, RN Outcome: Progressing Goal: Ability to manage health-related needs will improve Outcome: Adequate for Discharge   Problem: Metabolic: Goal: Ability to maintain appropriate glucose levels will improve Outcome: Adequate for Discharge   Problem: Nutritional: Goal: Maintenance of adequate nutrition will improve Outcome: Adequate for Discharge Goal: Progress toward achieving an optimal weight will improve Outcome: Adequate for Discharge   Problem: Skin Integrity: Goal: Risk for impaired skin integrity will decrease Outcome: Adequate for Discharge   Problem: Tissue Perfusion: Goal: Adequacy of tissue perfusion will improve Outcome: Adequate for Discharge   Problem: Education: Goal: Knowledge of General Education information will improve Description: Including pain rating scale, medication(s)/side effects and non-pharmacologic comfort measures Outcome: Adequate for Discharge   Problem: Health Behavior/Discharge Planning: Goal:  Ability to manage health-related needs will improve Outcome: Adequate for Discharge   Problem: Clinical Measurements: Goal: Ability to maintain clinical measurements within normal limits will improve Outcome: Adequate for Discharge Goal: Will remain free from infection Outcome: Adequate for Discharge Goal: Diagnostic test results will improve Outcome: Adequate for Discharge Goal: Respiratory complications will improve Outcome: Adequate for Discharge Goal: Cardiovascular complication will be avoided Outcome: Adequate for Discharge   Problem: Activity: Goal: Risk for activity intolerance will decrease Outcome: Adequate for Discharge   Problem: Nutrition: Goal: Adequate nutrition will be maintained Outcome: Adequate for Discharge   Problem: Coping: Goal: Level of anxiety will decrease Outcome: Adequate for Discharge   Problem: Elimination: Goal: Will not experience complications related to bowel motility Outcome: Adequate for Discharge Goal: Will not experience complications related to urinary retention Outcome: Adequate for Discharge   Problem: Pain Managment: Goal: General experience of comfort will improve and/or be controlled Outcome: Adequate for Discharge   Problem: Safety: Goal: Ability to remain free from injury will improve Outcome: Adequate for Discharge   Problem: Skin Integrity: Goal: Risk for impaired skin integrity will decrease Outcome: Adequate for Discharge

## 2024-06-19 NOTE — Discharge Summary (Signed)
 DISCHARGE SUMMARY    Jeremy West FMW:[id number] DOB: 05-Apr-1966 DOA: 06/17/2024  PCP: Claudene Tanda POUR, PA-C  Admit date: 06/17/2024 Discharge date: 06/19/2024   Recommendations for Outpatient Follow-up:  Follow up with PCP in 1-2 weeks for thyroid  and diabetes follow-up Follow-up with the EP as scheduled by cardiology for [facility] Hospital Course: Jeremy West 58 year old male with non-insulin-dependent type 2 diabetes, hypertension, morbid obesity, OSA on CPAP, PAF not on anticoagulation due to low CHA2DS2-VASc, who was admitted with recurrent A-fib with RVR.  He is followed by Dr. Cindie EP and maintained for suspected ventricular arrhythmia on Toprol -XL.  In the ED he was found to be in rapid A-fib and started on diltiazem .  Cardiology was consulted.  Patient was initiated on Cardizem  drip and flecainide.  He converted to NSR.  Rate improved.  By 11/30 patient remained in NSR with rate in the 70s.  Cardiology has cleared the patient for discharge with plan to follow-up outpatient with the EP which they will arrange. At time of discharge TSH is low and T4 still pending.  I have discussed this with the patient and he reports he will [facility].  He may require thyroid  medication and this may be an underlying cause of his A-fib.  A-fib with RVR History of PSVT - Rate up to 170 on arrival - Sinus post Cardizem  drip.  Now on flecainide.  NSR in the 53s - Cardiology consulted.  Has recommended discharging with flecainide and metoprolol  for now.  Plans to follow-up in EP clinic - TSH 0.205, T4 pending.  Have advised patient that this may be playing a role in his recurrent A-fib.  He will [facility] with [facility] for results - Echocardiogram October 2025 preserved EF, grade 1 diastolic dysfunction - CHA2DS2-VASc: 2 (diabetes and diastolic CHF) cardiology is not recommending anticoagulation at this time   Heart failure with preserved EF -  Most recent echocardiogram October 2025: EF 55 to 60%, grade 1 diastolic dysfunction - Clinically euvolemic - Educated on low-salt diet   Type 2 diabetes - Most recent hemoglobin A1c in March 2025: 7.8%, repeat A1c still pending.  Follow-up with PCP diabetes management - Continue home meds of Jardiance for now   BMI 41 Obesity Class III - Outpatient follow up for lifestyle modification and risk factor management - On Zepbound outpatient   OSA -- Cont CPAP nightly   Rheumatoid Arthritis -- Cont home dose prednisone  (20mg  daily) Discharge Instructions  Discharge Instructions     Call MD for:  difficulty breathing, headache or visual disturbances   Complete by: As directed    Call MD for:  persistant dizziness or light-headedness   Complete by: As directed    Call MD for:  persistant nausea and vomiting   Complete by: As directed    Call MD for:  severe uncontrolled pain   Complete by: As directed    Call MD for:  temperature >100.4   Complete by: As directed    Diet general   Complete by: As directed    Discharge instructions   Complete by: As directed    Please follow-up with your primary care physician to discuss low TSH and follow-up for results of T4.  Hemoglobin A1c, diabetes test, is also still pending at time of discharge.  Please follow-up with your primary care physician for diabetes management. Please follow-up with electrophysiology as scheduled by the cardiology team.   Increase activity slowly  Complete by: As directed       Allergies as of 06/19/2024       Reactions   Adalimumab Other (See Comments)   Humera - had to use a walker for a couple of months - out of work on [employer]   Penicillins Anaphylaxis   Childhood reaction Has patient had a PCN reaction causing immediate rash, facial/tongue/throat swelling, SOB or lightheadedness with hypotension: Unknown Has patient had a PCN reaction causing severe rash involving mucus membranes or skin necrosis:  Unknown Has patient had a PCN reaction that required hospitalization: Unknown Has patient had a PCN reaction occurring within the last 10 years: No If all of the above answers are NO, then may proceed with Cephalosporin use.        Medication List     STOP taking these medications    acetaminophen  650 MG CR tablet Commonly known as: TYLENOL        TAKE these medications    empagliflozin 25 MG Tabs tablet Commonly known as: JARDIANCE Take 25 mg by mouth daily.   flecainide 50 MG tablet Commonly known as: TAMBOCOR Take 1 tablet (50 mg total) by mouth every 12 (twelve) hours for 14 days.   metoprolol  succinate 50 MG 24 hr tablet Commonly known as: TOPROL -XL Take 1 tablet (50 mg total) by mouth 2 (two) times daily. Take with or immediately following a meal.   naproxen  500 MG tablet Commonly known as: NAPROSYN  Take 1 tablet (500 mg total) by mouth 2 (two) times daily with a meal.   predniSONE  20 MG tablet Commonly known as: DELTASONE  Take 1 tablet (20 mg total) by mouth daily with breakfast.   Zepbound 2.5 MG/0.5ML Pen Generic drug: tirzepatide Inject 2.5 mg into the skin once a week.        Allergies  Allergen Reactions   Adalimumab Other (See Comments)    Humera - had to use a walker for a couple of months - out of work on [employer]   Penicillins Anaphylaxis    Childhood reaction Has patient had a PCN reaction causing immediate rash, facial/tongue/throat swelling, SOB or lightheadedness with hypotension: Unknown Has patient had a PCN reaction causing severe rash involving mucus membranes or skin necrosis: Unknown Has patient had a PCN reaction that required hospitalization: Unknown Has patient had a PCN reaction occurring within the last 10 years: No If all of the above answers are NO, then may proceed with Cephalosporin use.     Consultations: Treatment Team:  Darron Deatrice LABOR, MD   Procedures/Studies: [facility] Chest Port 1 View Result Date:  06/17/2024 CLINICAL DATA:  Chest pain upon awakening this morning. Patient developed atrial fibrillation with rapid ventricular response this morning. EXAM: PORTABLE CHEST 1 VIEW COMPARISON:  11/14/2023. FINDINGS: Cardiac silhouette is normal in size and configuration. No mediastinal or hilar masses. Clear lungs.  No pleural effusion or pneumothorax. Skeletal structures are grossly intact. IMPRESSION: No active disease. Electronically Signed   By: Alm Parkins M.D.   On: 06/17/2024 10:07      Discharge Exam: Vitals:   06/19/24 0858 06/19/24 1245  BP: (!) 135/94 131/89  Pulse: 68 76  Resp: 18 18  Temp: 97.7 F (36.5 C) 98.6 F (37 C)  SpO2: 100% 95%   Vitals:   06/18/24 2345 06/19/24 0437 06/19/24 0858 06/19/24 1245  BP: 121/87 (!) 133/98 (!) 135/94 131/89  Pulse: 72 71 68 76  Resp: 20 (!) 22 18 18   Temp: 97.8 F (36.6 C) 97.7 F (36.5  C) 97.7 F (36.5 C) 98.6 F (37 C)  TempSrc:   Oral   SpO2: 95% 99% 100% 95%  Weight:      Height:        Constitutional:  Normal appearance. Non toxic-appearing.  HENT: Head Normocephalic and atraumatic.  Mucous membranes are moist.  Eyes:  Extraocular intact. Conjunctivae normal.  Cardiovascular: Rate and Rhythm: Normal rate and regular rhythm.  Pulmonary: Non labored, symmetric rise of chest wall.  Skin: warm and dry. not jaundiced.  Neurological: No focal deficit present. alert. Oriented.  Psychiatric: Mood and Affect congruent.    The results of significant diagnostics from this hospitalization (including imaging, microbiology, ancillary and laboratory) are listed below for reference.     Microbiology: No results found for this or any previous visit (from the past 240 hours).   Labs: BNP (last 3 results) No results for input(s): BNP in the last 8760 hours. Basic Metabolic Panel: Recent Labs  Lab 06/17/24 0919 06/17/24 1219 06/18/24 0459  NA 139  --  136  K 4.2  --  4.0  CL 104  --  103  CO2 23  --  22  GLUCOSE 125*   --  104*  BUN 20  --  19  CREATININE 1.38*  --  1.10  CALCIUM  9.0  --  8.7*  MG  --  2.1  --    Liver Function Tests: No results for input(s): AST, ALT, ALKPHOS, BILITOT, PROT, ALBUMIN in the last 168 hours. No results for input(s): LIPASE, AMYLASE in the last 168 hours. No results for input(s): AMMONIA in the last 168 hours. CBC: Recent Labs  Lab 06/17/24 0919 06/18/24 0459  WBC 12.3* 9.4  HGB 16.3 15.6  HCT 50.9 47.2  MCV 80.5 77.8*  PLT 307 287   Cardiac Enzymes: No results for input(s): CKTOTAL, CKMB, CKMBINDEX, TROPONINI in the last 168 hours. BNP: Invalid input(s): POCBNP CBG: Recent Labs  Lab 06/18/24 0815 06/18/24 1151 06/18/24 1657 06/18/24 2136 06/19/24 0853  GLUCAP 99 108* 104* 154* 85   D-Dimer No results for input(s): DDIMER in the last 72 hours. Hgb A1c No results for input(s): HGBA1C in the last 72 hours. Lipid Profile No results for input(s): CHOL, HDL, LDLCALC, TRIG, CHOLHDL, LDLDIRECT in the last 72 hours. Thyroid  function studies Recent Labs    06/17/24 1219  TSH 0.205*   Anemia work up No results for input(s): VITAMINB12, FOLATE, FERRITIN, TIBC, IRON, RETICCTPCT in the last 72 hours. Urinalysis    Component Value Date/Time   COLORURINE Straw 10/26/2013 1034   APPEARANCEUR Clear 10/26/2013 1034   LABSPEC 1.008 10/26/2013 1034   PHURINE 7.0 10/26/2013 1034   GLUCOSEU Negative 10/26/2013 1034   HGBUR Negative 10/26/2013 1034   BILIRUBINUR Negative 10/01/2023 0944   BILIRUBINUR Negative 10/26/2013 1034   KETONESUR Negative 10/26/2013 1034   PROTEINUR Negative 10/01/2023 0944   PROTEINUR Negative 10/26/2013 1034   UROBILINOGEN 0.2 10/01/2023 0944   NITRITE Negative 10/01/2023 0944   NITRITE Negative 10/26/2013 1034   LEUKOCYTESUR Negative 10/01/2023 0944   LEUKOCYTESUR Negative 10/26/2013 1034   Sepsis Labs Recent Labs  Lab 06/17/24 0919 06/18/24 0459  WBC 12.3* 9.4    Microbiology No results found for this or any previous visit (from the past 240 hours).   Time coordinating discharge: 32 min   SIGNED: Clora Ohmer, DO [facility] 06/19/2024, 12:57 PM Pager   If 7PM-7AM, please contact night-coverage

## 2024-06-19 NOTE — Plan of Care (Signed)

## 2024-06-19 NOTE — Plan of Care (Signed)
  Problem: Education: Goal: Ability to describe self-care measures that may prevent or decrease complications (Diabetes Survival Skills Education) will improve Outcome: Progressing   Problem: Fluid Volume: Goal: Ability to maintain a balanced intake and output will improve Outcome: Progressing   Problem: Health Behavior/Discharge Planning: Goal: Ability to identify and utilize available resources and services will improve Outcome: Progressing

## 2024-06-19 NOTE — Progress Notes (Signed)
 Rounding Note   Patient Name: Jeremy West Date of Encounter: 06/19/2024  [facility] Cardiologist: Evalene Lunger, MD   Subjective Denies palpitations, spontaneously converted to sinus rhythm earlier this morning.  Scheduled Meds:  enoxaparin  (LOVENOX ) injection  40 mg Subcutaneous Q24H   flecainide  50 mg Oral Q12H   insulin aspart  0-15 Units Subcutaneous TID WC   insulin aspart  0-5 Units Subcutaneous QHS   metoprolol  succinate  50 mg Oral BID   predniSONE   20 mg Oral Q breakfast   Continuous Infusions:  diltiazem  (CARDIZEM ) infusion Stopped (06/18/24 2213)   PRN Meds: acetaminophen  **OR** acetaminophen , albuterol , ondansetron  **OR** ondansetron  (ZOFRAN ) IV   Vital Signs  Vitals:   06/18/24 2345 06/19/24 0437 06/19/24 0858 06/19/24 1245  BP: 121/87 (!) 133/98 (!) 135/94 131/89  Pulse: 72 71 68 76  Resp: 20 (!) 22 18 18   Temp: 97.8 F (36.6 C) 97.7 F (36.5 C) 97.7 F (36.5 C) 98.6 F (37 C)  TempSrc:   Oral   SpO2: 95% 99% 100% 95%  Weight:      Height:        Intake/Output Summary (Last 24 hours) at 06/19/2024 1640 Last data filed at 06/19/2024 1033 Gross per 24 hour  Intake 720 ml  Output --  Net 720 ml      06/17/2024    9:18 AM 03/24/2024    9:50 AM 03/16/2024    8:54 AM  Last 3 Weights  Weight (lbs) 139 lb 3.2 oz 306 lb 14.1 oz 307 lb  Weight (kg) 63.141 kg 139.2 kg 139.254 kg      Telemetry Sinus rhythm, heart rate 76- Personally Reviewed  ECG   - Personally Reviewed  Physical Exam  GEN: No acute distress.   Neck: No JVD Cardiac: RRR, no murmurs, rubs, or gallops.  Respiratory: Clear to auscultation bilaterally. GI: Soft, nontender, non-distended  MS: No edema; No deformity. Neuro:  Nonfocal  Psych: Normal affect   Labs High Sensitivity Troponin:  No results for input(s): TROPONINIHS in the last 720 hours.   Chemistry Recent Labs  Lab 06/17/24 0919 06/17/24 1219 06/18/24 0459  NA 139  --  136  K 4.2  --   4.0  CL 104  --  103  CO2 23  --  22  GLUCOSE 125*  --  104*  BUN 20  --  19  CREATININE 1.38*  --  1.10  CALCIUM  9.0  --  8.7*  MG  --  2.1  --   GFRNONAA 59*  --  >60  ANIONGAP 13  --  11    Lipids No results for input(s): CHOL, TRIG, HDL, LABVLDL, LDLCALC, CHOLHDL in the last 168 hours.  Hematology Recent Labs  Lab 06/17/24 0919 06/18/24 0459  WBC 12.3* 9.4  RBC 6.32* 6.07*  HGB 16.3 15.6  HCT 50.9 47.2  MCV 80.5 77.8*  MCH 25.8* 25.7*  MCHC 32.0 33.1  RDW 15.0 14.8  PLT 307 287   Thyroid   Recent Labs  Lab 06/17/24 1219  TSH 0.205*    BNPNo results for input(s): BNP, PROBNP in the last 168 hours.  DDimer No results for input(s): DDIMER in the last 168 hours.   Radiology  No results found.  Cardiac Studies Echo 10/25 EF 55 to 60%. Cardiac PET 8/25 no ischemia.  Patient Profile   58 y.o. male with history of paroxysmal atrial fibrillation, diabetes, obesity, OSA/CPAP presenting with chest pain being seen for A-fib RVR.  Assessment & Plan  A-fib RVR -Converted to sinus earlier this a.m.  Maintaining sinus rhythm. - Continue Lopressor  50 mg twice daily, flecainide 50 mg twice daily. - Follow-up with EP as outpatient.   Signed, Redell Cave, MD  06/19/2024, 4:40 PM

## 2024-06-20 SURGERY — CARDIOVERSION
Anesthesia: General

## 2024-06-23 NOTE — Progress Notes (Unsigned)
     Electrophysiology Clinic Note    Date:  06/23/2024  Patient ID:  Jeremy West, Jeremy West May 13, 1966, MRN [id number] PCP:  Claudene Tanda POUR, PA-C  Cardiologist:  Evalene Lunger, MD  Electrophysiologist:  OLE ONEIDA HOLTS, MD    ***refresh  Discussed the use of AI scribe software for clinical note transcription with the patient, who gave verbal consent to proceed.   Patient Profile    Chief Complaint: ***  History of Present Illness: Jeremy West is a 58 y.o. male with PMH notable for afib, PVC, NSVT, T2DM, HTN, OSA on CPAP, dilated aortic root, RA; seen today for OLE ONEIDA HOLTS, MD for post hospital follow up.    He last saw dr. Holts 02/2024. They had previously planned to evaluate for ischemia via PET stress and if appropriate, initiate flecainide. At that appointment, patient was maintaining sinus rhythm and so metop was continued without the addition of flecainide.   He presented to ER 06/17/24 w c/o chest pain, SOB and found to be in AFib w RVR and was started on dilt gtt. Flecainide 50mg  was started. He converted to sinus rhythm 11/30 and was discharged.   *** AF burden, symptoms *** palpitations *** bleeding concerns  - get ETT?    Since discharge from hospital the patient reports doing ***.  he denies chest pain, palpitations, dyspnea, PND, orthopnea, nausea, vomiting, dizziness, syncope, edema, weight gain, or early satiety.      Arrhythmia/Device History No specialty comments available.    ROS:  Please see the history of present illness. All other systems are reviewed and otherwise negative.    Physical Exam    VS:  There were no vitals taken for this visit. BMI: There is no height or weight on file to calculate BMI.           Wt Readings from Last 3 Encounters:  06/17/24 139 lb 3.2 oz (63.1 kg)  03/24/24 (!) 306 lb 14.1 oz (139.2 kg)  03/16/24 (!) 307 lb (139.3 kg)     GEN- The patient is well appearing, alert and oriented x 3 today.    Lungs- Clear to ausculation bilaterally, normal work of breathing.  Heart- {Blank single:19197::Regular,Irregularly irregular} rate and rhythm, no murmurs, rubs or gallops Extremities- {EDEMA LEVEL:28147::No} peripheral edema, warm, dry Skin-  *** device pocket well-healed, no tethering   *** Brief check performed without iterative lead testing/measurements   Device interrogation done today and reviewed by myself:  Battery *** Lead thresholds, impedence, sensing stable *** *** episodes *** changes made today   Studies Reviewed   Previous EP, cardiology notes.    EKG {ACTION; IS/IS WNU:78978602} ordered. Personal review of EKG from {Blank single:19197::today,***} shows:  ***             Assessment and Plan     #) ***   #) ***   {Are you ordering a CV Procedure (e.g. stress test, cath, DCCV, TEE, etc)?   Press F2        :[id number]}   Current medicines are reviewed at length with the patient today.   The patient {ACTIONS; HAS/DOES NOT HAVE:19233} concerns regarding his medicines.  The following changes were made today:  {NONE DEFAULTED:18576}  Labs/ tests ordered today include: *** No orders of the defined types were placed in this encounter.    Disposition: Follow up with {EPMDS:28135::EP Team} or EP APP {EPFOLLOW UP:28173}   Signed, Chantal Needle, NP  06/23/24  7:19 PM  Electrophysiology [facility]

## 2024-06-24 ENCOUNTER — Ambulatory Visit: Admitting: Cardiology

## 2024-07-10 ENCOUNTER — Other Ambulatory Visit: Payer: Self-pay

## 2024-07-10 ENCOUNTER — Emergency Department
Admission: EM | Admit: 2024-07-10 | Discharge: 2024-07-10 | Disposition: A | Discharge status: Home / Self Care | Attending: Emergency Medicine | Admitting: Emergency Medicine

## 2024-07-10 ENCOUNTER — Emergency Department

## 2024-07-10 DIAGNOSIS — R051 Acute cough: Secondary | ICD-10-CM | POA: Diagnosis not present

## 2024-07-10 DIAGNOSIS — I509 Heart failure, unspecified: Secondary | ICD-10-CM | POA: Insufficient documentation

## 2024-07-10 DIAGNOSIS — J0110 Acute frontal sinusitis, unspecified: Secondary | ICD-10-CM | POA: Insufficient documentation

## 2024-07-10 DIAGNOSIS — R059 Cough, unspecified: Secondary | ICD-10-CM | POA: Diagnosis not present

## 2024-07-10 DIAGNOSIS — R0989 Other specified symptoms and signs involving the circulatory and respiratory systems: Secondary | ICD-10-CM | POA: Diagnosis not present

## 2024-07-10 LAB — RESP PANEL BY RT-PCR (RSV, FLU A&B, COVID)  RVPGX2
Influenza A by PCR: NEGATIVE
Influenza B by PCR: NEGATIVE
Resp Syncytial Virus by PCR: NEGATIVE
SARS Coronavirus 2 by RT PCR: NEGATIVE

## 2024-07-10 MED ORDER — DOXYCYCLINE HYCLATE 100 MG PO TABS
100.0000 mg | ORAL_TABLET | Freq: Once | ORAL | Status: AC
  Administered 2024-07-10: 100 mg via ORAL
  Filled 2024-07-10: qty 1

## 2024-07-10 MED ORDER — BENZONATATE 100 MG PO CAPS: 100.0000 mg | ORAL_CAPSULE | Freq: Two times a day (BID) | ORAL | 0 refills | Status: AC | PRN

## 2024-07-10 MED ORDER — DOXYCYCLINE HYCLATE 100 MG PO TABS: 100.0000 mg | ORAL_TABLET | Freq: Two times a day (BID) | ORAL | 0 refills | Status: AC

## 2024-07-10 NOTE — Discharge Instructions (Signed)
 Please take the antibiotics as prescribed for your sinusitis.  Please make sure to follow-up with your primary care doctor this week to get reassessed.

## 2024-07-10 NOTE — ED Provider Notes (Signed)
 [facility] Provider Note    Event Date/Time   First MD Initiated Contact with Patient 07/10/24 1204     (approximate)   History   Cough   HPI  Jeremy West is a 58 y.o. male history of depression, CHF, nephrolithiasis, presenting with cough.  States that been ongoing for a week.  Cough is a white sputum.  Also noted frontal sinus pressure, notes rhinorrhea intermittently white or yellow.  States he tried all the over-the-counter stuff without improvement.  No fever.  Denies any chest pain or shortness of breath, no other symptoms.  On independent chart review, he was admitted in late November for A-fib with RVR and history of paroxysmal SVT, was not started on anticoagulation due to cardiology recommendation.     Physical Exam   Triage Vital Signs: ED Triage Vitals  Encounter Vitals Group     BP 07/10/24 1103 (!) 122/107     Girls Systolic BP Percentile --      Girls Diastolic BP Percentile --      Boys Systolic BP Percentile --      Boys Diastolic BP Percentile --      Pulse Rate 07/10/24 1103 88     Resp 07/10/24 1103 20     Temp 07/10/24 1103 98 F (36.7 C)     Temp Source 07/10/24 1103 Oral     SpO2 07/10/24 1103 97 %     Weight 07/10/24 1102 141 lb 1.5 oz (64 kg)     Height 07/10/24 1102 6' (1.829 m)     Head Circumference --      Peak Flow --      Pain Score 07/10/24 1100 0     Pain Loc --      Pain Education --      Exclude from Growth Chart --     Most recent vital signs: Vitals:   07/10/24 1103  BP: (!) 122/107  Pulse: 88  Resp: 20  Temp: 98 F (36.7 C)  SpO2: 97%     General: Awake, no distress.  CV:  Good peripheral perfusion.  Resp:  Normal effort.  No tachypnea or respiratory distress, clear Abd:  No distention.  Soft nontender Other:  Nontoxic-appearing, no nuchal rigidity, moving all 4 extremities without focal weakness, he does have some tenderness on palpation to his frontal sinuses.  No maxillary sinus  tenderness.  Clear oropharynx.   ED Results / Procedures / Treatments   Labs (all labs ordered are listed, but only abnormal results are displayed) Labs Reviewed  RESP PANEL BY RT-PCR (RSV, FLU A&B, COVID)  [id number]     RADIOLOGY On my independent interpretation, chest x-ray without obvious consolidation   PROCEDURES:  Critical Care performed: No  Procedures   MEDICATIONS ORDERED IN ED: Medications  doxycycline  (VIBRA -TABS) tablet 100 mg (has no administration in time range)     IMPRESSION / MDM / ASSESSMENT AND PLAN / ED COURSE  I reviewed the triage vital signs and the nursing notes.                              Differential diagnosis includes, but is not limited to, viral illness, pneumonia, bacterial sinusitis.  Will get respiratory viral panel, chest x-ray.  Given that has been a week with no improvement to his sinusitis symptoms, we will start him antibiotics.  Did discuss with him about outpatient follow-up with primary  care this week.  He is also asking for some antitussive  medications, will give him a short prescription of Tessalon  pearls.  Patient's presentation is most consistent with acute presentation with potential threat to life or bodily function.  Independent interpretation of labs and imaging below.  Patient is well-appearing, was given first dose of antibiotics in the emergency department and send him home with a prescription for antibiotics for sinusitis.  Discussed with him about outpatient follow-up with primary care and to be reassessed this week.  Will also send some Tessalon  Perles to his pharmacy.  Otherwise considered but no indication for inpatient admission at this time, he safe for outpatient management.  Will discharge with strict return precautions.    Clinical Course as of 07/10/24 1248  Sun Jul 10, 2024  1213 Resp panel by RT-PCR (RSV, Flu A&B, Covid) Anterior Nasal Swab neg [TT]  1244 DG Chest 1 View 1. No acute process.  [TT]     Clinical Course User Index [TT] Waymond Lorelle Cummins, MD     FINAL CLINICAL IMPRESSION(S) / ED DIAGNOSES   Final diagnoses:  Acute cough  Acute frontal sinusitis, recurrence not specified     Rx / DC Orders   ED Discharge Orders          Ordered    doxycycline  (VIBRA -TABS) 100 MG tablet  2 times daily        07/10/24 1246    benzonatate  (TESSALON ) 100 MG capsule  2 times daily PRN        07/10/24 1247             Note:  This document was prepared using Dragon voice recognition software and may include unintentional dictation errors.    Waymond Lorelle Cummins, MD 07/10/24 [phone]

## 2024-07-10 NOTE — ED Triage Notes (Signed)
 Pt to ED for cough and possible frontal sinus infection/pressure  since 1 week. Respirations are unlabored.

## 2024-07-12 ENCOUNTER — Telehealth: Payer: Self-pay | Admitting: *Deleted

## 2024-07-12 NOTE — Telephone Encounter (Signed)
 Received a message from the front admin team that they were attempting to schedule the patient's GXT. The patient got upset and hung up on the team member.  A call was placed to the patient and an appointment offered for 12/30 for the Flecainide  GXT. The patient became upset and stated that he did not need this test and he already had one with an IV. He was advised that this test was used after starting flecainide . He became irate and stated that he was dissatisfied with this and he would go to the [facility]. If he decided to use this office again then he would call and proceeded to hang up.   The patient will get any further refills from the [facility].

## 2024-07-19 ENCOUNTER — Ambulatory Visit

## 2024-09-29 ENCOUNTER — Encounter: Admitting: Physician Assistant
